# Patient Record
Sex: Male | Born: 1950 | Race: White | Hispanic: No | Marital: Married | Smoking: Never smoker
Health system: Southern US, Community
[De-identification: ages and names within clinical notes are randomized; demographics above are authoritative.]

## PROBLEM LIST (undated history)

## (undated) ENCOUNTER — Emergency Department (HOSPITAL_COMMUNITY): Admission: EM | Payer: BC Managed Care – PPO

## (undated) DIAGNOSIS — L989 Disorder of the skin and subcutaneous tissue, unspecified: Secondary | ICD-10-CM

## (undated) DIAGNOSIS — L509 Urticaria, unspecified: Secondary | ICD-10-CM

## (undated) DIAGNOSIS — I10 Essential (primary) hypertension: Secondary | ICD-10-CM

## (undated) DIAGNOSIS — E785 Hyperlipidemia, unspecified: Secondary | ICD-10-CM

## (undated) DIAGNOSIS — M21379 Foot drop, unspecified foot: Secondary | ICD-10-CM

## (undated) DIAGNOSIS — H2513 Age-related nuclear cataract, bilateral: Secondary | ICD-10-CM

## (undated) DIAGNOSIS — Z7982 Long term (current) use of aspirin: Secondary | ICD-10-CM

## (undated) DIAGNOSIS — H04123 Dry eye syndrome of bilateral lacrimal glands: Secondary | ICD-10-CM

## (undated) DIAGNOSIS — R251 Tremor, unspecified: Secondary | ICD-10-CM

## (undated) DIAGNOSIS — M48061 Spinal stenosis, lumbar region without neurogenic claudication: Secondary | ICD-10-CM

## (undated) DIAGNOSIS — N138 Other obstructive and reflux uropathy: Secondary | ICD-10-CM

## (undated) DIAGNOSIS — Z91018 Allergy to other foods: Secondary | ICD-10-CM

## (undated) DIAGNOSIS — H903 Sensorineural hearing loss, bilateral: Secondary | ICD-10-CM

## (undated) DIAGNOSIS — I2510 Atherosclerotic heart disease of native coronary artery without angina pectoris: Secondary | ICD-10-CM

## (undated) DIAGNOSIS — I219 Acute myocardial infarction, unspecified: Secondary | ICD-10-CM

## (undated) DIAGNOSIS — J45909 Unspecified asthma, uncomplicated: Secondary | ICD-10-CM

## (undated) DIAGNOSIS — R413 Other amnesia: Secondary | ICD-10-CM

## (undated) DIAGNOSIS — M419 Scoliosis, unspecified: Secondary | ICD-10-CM

## (undated) DIAGNOSIS — I700 Atherosclerosis of aorta: Secondary | ICD-10-CM

## (undated) DIAGNOSIS — I255 Ischemic cardiomyopathy: Secondary | ICD-10-CM

## (undated) DIAGNOSIS — R972 Elevated prostate specific antigen [PSA]: Secondary | ICD-10-CM

## (undated) DIAGNOSIS — I6789 Other cerebrovascular disease: Secondary | ICD-10-CM

## (undated) HISTORY — DX: Urticaria, unspecified: L50.9

## (undated) HISTORY — DX: Spinal stenosis, lumbar region without neurogenic claudication: M48.061

## (undated) HISTORY — DX: Foot drop, unspecified foot: M21.379

## (undated) HISTORY — DX: Age-related nuclear cataract, bilateral: H25.13

## (undated) HISTORY — DX: Essential (primary) hypertension: I10

## (undated) HISTORY — PX: TONSILLECTOMY: SUR1361

## (undated) HISTORY — DX: Dry eye syndrome of bilateral lacrimal glands: H04.123

## (undated) HISTORY — DX: Allergy to other foods: Z91.018

## (undated) HISTORY — DX: Scoliosis, unspecified: M41.9

## (undated) HISTORY — DX: Sensorineural hearing loss, bilateral: H90.3

## (undated) HISTORY — DX: Elevated prostate specific antigen (PSA): R97.20

## (undated) HISTORY — DX: Disorder of the skin and subcutaneous tissue, unspecified: L98.9

## (undated) HISTORY — DX: Tremor, unspecified: R25.1

---

## 2002-07-06 DIAGNOSIS — Z800 Family history of malignant neoplasm of digestive organs: Secondary | ICD-10-CM

## 2002-07-06 DIAGNOSIS — E785 Hyperlipidemia, unspecified: Secondary | ICD-10-CM | POA: Diagnosis present

## 2002-07-06 DIAGNOSIS — M545 Low back pain, unspecified: Secondary | ICD-10-CM

## 2002-07-06 DIAGNOSIS — J45909 Unspecified asthma, uncomplicated: Secondary | ICD-10-CM | POA: Diagnosis present

## 2002-07-06 DIAGNOSIS — M1990 Unspecified osteoarthritis, unspecified site: Secondary | ICD-10-CM | POA: Insufficient documentation

## 2002-07-06 HISTORY — DX: Family history of malignant neoplasm of digestive organs: Z80.0

## 2002-07-06 HISTORY — DX: Hyperlipidemia, unspecified: E78.5

## 2002-07-06 HISTORY — DX: Low back pain, unspecified: M54.50

## 2002-07-06 HISTORY — DX: Unspecified osteoarthritis, unspecified site: M19.90

## 2002-07-12 DIAGNOSIS — Z79899 Other long term (current) drug therapy: Secondary | ICD-10-CM | POA: Insufficient documentation

## 2003-09-13 ENCOUNTER — Encounter: Admission: RE | Admit: 2003-09-13 | Discharge: 2003-09-13 | Payer: Self-pay | Admitting: Orthopedic Surgery

## 2003-10-25 ENCOUNTER — Ambulatory Visit (HOSPITAL_COMMUNITY): Admission: RE | Admit: 2003-10-25 | Discharge: 2003-10-25 | Payer: Self-pay | Admitting: Orthopedic Surgery

## 2003-10-25 ENCOUNTER — Ambulatory Visit (HOSPITAL_BASED_OUTPATIENT_CLINIC_OR_DEPARTMENT_OTHER): Admission: RE | Admit: 2003-10-25 | Discharge: 2003-10-25 | Payer: Self-pay | Admitting: Orthopedic Surgery

## 2004-03-30 DIAGNOSIS — Z0000 Encounter for general adult medical examination without abnormal findings: Secondary | ICD-10-CM | POA: Insufficient documentation

## 2012-09-02 DIAGNOSIS — D229 Melanocytic nevi, unspecified: Secondary | ICD-10-CM

## 2012-09-02 HISTORY — DX: Melanocytic nevi, unspecified: D22.9

## 2013-04-06 ENCOUNTER — Ambulatory Visit (INDEPENDENT_AMBULATORY_CARE_PROVIDER_SITE_OTHER): Payer: BC Managed Care – PPO | Admitting: Urology

## 2013-04-06 DIAGNOSIS — R972 Elevated prostate specific antigen [PSA]: Secondary | ICD-10-CM

## 2013-07-06 ENCOUNTER — Ambulatory Visit (INDEPENDENT_AMBULATORY_CARE_PROVIDER_SITE_OTHER): Payer: BC Managed Care – PPO | Admitting: Urology

## 2013-07-06 ENCOUNTER — Encounter (INDEPENDENT_AMBULATORY_CARE_PROVIDER_SITE_OTHER): Payer: Self-pay

## 2013-07-06 DIAGNOSIS — N529 Male erectile dysfunction, unspecified: Secondary | ICD-10-CM

## 2013-07-06 DIAGNOSIS — N400 Enlarged prostate without lower urinary tract symptoms: Secondary | ICD-10-CM

## 2014-02-25 ENCOUNTER — Inpatient Hospital Stay (HOSPITAL_COMMUNITY)
Admission: EM | Admit: 2014-02-25 | Discharge: 2014-02-27 | DRG: 247 | Disposition: A | Discharge status: Home / Self Care | Payer: BC Managed Care – PPO | Source: Other Acute Inpatient Hospital | Attending: Interventional Cardiology | Admitting: Interventional Cardiology

## 2014-02-25 ENCOUNTER — Other Ambulatory Visit: Payer: Self-pay

## 2014-02-25 ENCOUNTER — Encounter (HOSPITAL_COMMUNITY)
Admission: EM | Disposition: A | Discharge status: Home / Self Care | Payer: BC Managed Care – PPO | Source: Other Acute Inpatient Hospital | Attending: Interventional Cardiology

## 2014-02-25 ENCOUNTER — Encounter (HOSPITAL_COMMUNITY): Payer: Self-pay | Admitting: Physician Assistant

## 2014-02-25 DIAGNOSIS — I2589 Other forms of chronic ischemic heart disease: Secondary | ICD-10-CM | POA: Diagnosis present

## 2014-02-25 DIAGNOSIS — I2119 ST elevation (STEMI) myocardial infarction involving other coronary artery of inferior wall: Secondary | ICD-10-CM | POA: Diagnosis present

## 2014-02-25 DIAGNOSIS — I519 Heart disease, unspecified: Secondary | ICD-10-CM

## 2014-02-25 DIAGNOSIS — J45909 Unspecified asthma, uncomplicated: Secondary | ICD-10-CM | POA: Diagnosis present

## 2014-02-25 DIAGNOSIS — I2582 Chronic total occlusion of coronary artery: Secondary | ICD-10-CM | POA: Diagnosis present

## 2014-02-25 DIAGNOSIS — I10 Essential (primary) hypertension: Secondary | ICD-10-CM | POA: Diagnosis present

## 2014-02-25 DIAGNOSIS — E785 Hyperlipidemia, unspecified: Secondary | ICD-10-CM | POA: Diagnosis present

## 2014-02-25 DIAGNOSIS — I2510 Atherosclerotic heart disease of native coronary artery without angina pectoris: Secondary | ICD-10-CM | POA: Diagnosis present

## 2014-02-25 DIAGNOSIS — R079 Chest pain, unspecified: Secondary | ICD-10-CM | POA: Diagnosis present

## 2014-02-25 DIAGNOSIS — Z8249 Family history of ischemic heart disease and other diseases of the circulatory system: Secondary | ICD-10-CM | POA: Diagnosis not present

## 2014-02-25 DIAGNOSIS — I2111 ST elevation (STEMI) myocardial infarction involving right coronary artery: Secondary | ICD-10-CM

## 2014-02-25 DIAGNOSIS — I255 Ischemic cardiomyopathy: Secondary | ICD-10-CM

## 2014-02-25 HISTORY — PX: LEFT HEART CATHETERIZATION WITH CORONARY ANGIOGRAM: SHX5451

## 2014-02-25 HISTORY — DX: Essential (primary) hypertension: I10

## 2014-02-25 HISTORY — PX: CORONARY STENT PLACEMENT: SHX1402

## 2014-02-25 HISTORY — DX: ST elevation (STEMI) myocardial infarction involving other coronary artery of inferior wall: I21.19

## 2014-02-25 HISTORY — DX: Ischemic cardiomyopathy: I25.5

## 2014-02-25 HISTORY — DX: Unspecified asthma, uncomplicated: J45.909

## 2014-02-25 HISTORY — PX: PERCUTANEOUS CORONARY STENT INTERVENTION (PCI-S): SHX5485

## 2014-02-25 HISTORY — DX: Hyperlipidemia, unspecified: E78.5

## 2014-02-25 HISTORY — DX: Atherosclerotic heart disease of native coronary artery without angina pectoris: I25.10

## 2014-02-25 LAB — BASIC METABOLIC PANEL
BUN: 15 mg/dL (ref 6–23)
CO2: 18 mEq/L — ABNORMAL LOW (ref 19–32)
Calcium: 8.6 mg/dL (ref 8.4–10.5)
Chloride: 105 mEq/L (ref 96–112)
Creatinine, Ser: 0.99 mg/dL (ref 0.50–1.35)
GFR calc Af Amer: >90 mL/min (ref >90)
GFR calc non Af Amer: 85 mL/min — ABNORMAL LOW (ref >90)
Glucose, Bld: 95 mg/dL (ref 70–99)
Potassium: 4.5 mEq/L (ref 3.7–5.3)
Sodium: 138 mEq/L (ref 137–147)

## 2014-02-25 LAB — MRSA PCR SCREENING: MRSA by PCR: NEGATIVE

## 2014-02-25 LAB — HEMOGLOBIN A1C
Hgb A1c MFr Bld: 5.5 % (ref <5.7)
Mean Plasma Glucose: 111 mg/dL (ref <117)

## 2014-02-25 LAB — TSH: TSH: 3.22 u[IU]/mL (ref 0.350–4.500)

## 2014-02-25 LAB — TROPONIN I
Troponin I: 7.82 ng/mL (ref <0.30)
Troponin I: >20 ng/mL (ref <0.30)

## 2014-02-25 SURGERY — LEFT HEART CATHETERIZATION WITH CORONARY ANGIOGRAM
Anesthesia: LOCAL

## 2014-02-25 MED ORDER — SODIUM CHLORIDE 0.9 % IV SOLN
1.7500 mg/kg/h | INTRAVENOUS | Status: AC
  Administered 2014-02-25: 1.75 mg/kg/h via INTRAVENOUS
  Filled 2014-02-25: qty 250

## 2014-02-25 MED ORDER — TICAGRELOR 90 MG PO TABS
90.0000 mg | ORAL_TABLET | Freq: Two times a day (BID) | ORAL | Status: DC
  Administered 2014-02-25 – 2014-02-27 (×4): 90 mg via ORAL
  Filled 2014-02-25 (×6): qty 1

## 2014-02-25 MED ORDER — LIDOCAINE HCL (PF) 1 % IJ SOLN
INTRAMUSCULAR | Status: AC
  Filled 2014-02-25: qty 30

## 2014-02-25 MED ORDER — SODIUM CHLORIDE 0.9 % IV SOLN
0.2500 mg/kg/h | INTRAVENOUS | Status: AC
  Administered 2014-02-25: 0.25 mg/kg/h via INTRAVENOUS
  Filled 2014-02-25: qty 250

## 2014-02-25 MED ORDER — ACETAMINOPHEN 325 MG PO TABS
650.0000 mg | ORAL_TABLET | ORAL | Status: DC | PRN
  Administered 2014-02-25: 650 mg via ORAL
  Filled 2014-02-25: qty 2

## 2014-02-25 MED ORDER — TICAGRELOR 90 MG PO TABS
ORAL_TABLET | ORAL | Status: AC
  Filled 2014-02-25: qty 1

## 2014-02-25 MED ORDER — ASPIRIN EC 81 MG PO TBEC
81.0000 mg | DELAYED_RELEASE_TABLET | Freq: Every day | ORAL | Status: DC
  Administered 2014-02-26 – 2014-02-27 (×2): 81 mg via ORAL
  Filled 2014-02-25 (×2): qty 1

## 2014-02-25 MED ORDER — ONDANSETRON HCL 4 MG/2ML IJ SOLN: 4.0000 mg | Freq: Four times a day (QID) | INTRAMUSCULAR | Status: DC | PRN

## 2014-02-25 MED ORDER — FENTANYL CITRATE 0.05 MG/ML IJ SOLN
INTRAMUSCULAR | Status: AC
  Filled 2014-02-25: qty 2

## 2014-02-25 MED ORDER — METOPROLOL TARTRATE 25 MG PO TABS
25.0000 mg | ORAL_TABLET | Freq: Two times a day (BID) | ORAL | Status: DC
  Administered 2014-02-25 – 2014-02-27 (×5): 25 mg via ORAL
  Filled 2014-02-25 (×6): qty 1

## 2014-02-25 MED ORDER — SODIUM CHLORIDE 0.9 % IV SOLN
INTRAVENOUS | Status: DC
  Administered 2014-02-25: 14:00:00 via INTRAVENOUS
  Administered 2014-02-25: 1000 mL via INTRAVENOUS

## 2014-02-25 MED ORDER — NITROGLYCERIN 0.2 MG/ML ON CALL CATH LAB
INTRAVENOUS | Status: AC
  Filled 2014-02-25: qty 1

## 2014-02-25 MED ORDER — TICAGRELOR 90 MG PO TABS
ORAL_TABLET | ORAL | Status: AC
  Administered 2014-02-26: 90 mg via ORAL
  Filled 2014-02-25: qty 1

## 2014-02-25 MED ORDER — NITROGLYCERIN 0.4 MG SL SUBL: 0.4000 mg | SUBLINGUAL_TABLET | SUBLINGUAL | Status: DC | PRN

## 2014-02-25 MED ORDER — MIDAZOLAM HCL 2 MG/2ML IJ SOLN
INTRAMUSCULAR | Status: AC
  Filled 2014-02-25: qty 2

## 2014-02-25 MED ORDER — NITROGLYCERIN IN D5W 200-5 MCG/ML-% IV SOLN
2.0000 ug/min | INTRAVENOUS | Status: DC
  Administered 2014-02-25: 15 ug/min via INTRAVENOUS

## 2014-02-25 MED ORDER — HEPARIN (PORCINE) IN NACL 2-0.9 UNIT/ML-% IJ SOLN
INTRAMUSCULAR | Status: AC
  Filled 2014-02-25: qty 1000

## 2014-02-25 MED ORDER — OXYCODONE-ACETAMINOPHEN 5-325 MG PO TABS
1.0000 | ORAL_TABLET | ORAL | Status: DC | PRN
  Administered 2014-02-25 – 2014-02-26 (×2): 1 via ORAL
  Filled 2014-02-25 (×2): qty 2
  Filled 2014-02-25 (×2): qty 1

## 2014-02-25 MED ORDER — ACETAMINOPHEN 325 MG PO TABS: 650.0000 mg | ORAL_TABLET | ORAL | Status: DC | PRN

## 2014-02-25 MED ORDER — SIMVASTATIN 40 MG PO TABS
40.0000 mg | ORAL_TABLET | Freq: Every day | ORAL | Status: DC
Start: 2014-02-25 — End: 2014-02-26
  Administered 2014-02-25: 40 mg via ORAL
  Filled 2014-02-25 (×2): qty 1

## 2014-02-25 MED ORDER — ASPIRIN 81 MG PO CHEW: 81.0000 mg | CHEWABLE_TABLET | Freq: Every day | ORAL | Status: DC

## 2014-02-25 MED ORDER — VERAPAMIL HCL 2.5 MG/ML IV SOLN
INTRAVENOUS | Status: AC
  Filled 2014-02-25: qty 2

## 2014-02-25 MED ORDER — BIVALIRUDIN 250 MG IV SOLR
INTRAVENOUS | Status: AC
  Filled 2014-02-25: qty 250

## 2014-02-25 MED ORDER — HEPARIN SODIUM (PORCINE) 1000 UNIT/ML IJ SOLN
INTRAMUSCULAR | Status: AC
  Filled 2014-02-25: qty 1

## 2014-02-25 NOTE — CV Procedure (Addendum)
Left Heart Catheterization with Coronary Angiography and Emergency PCI Report  Eric Cortez  63 y.o.  male June 01, 1951  Procedure Date: 02/25/2014 Referring Physician: HWB Blenda Bridegroom, M.D. Primary Cardiologist: HWB Blenda Bridegroom, M.D.  INDICATIONS: Acute inferior ST elevation myocardial infarction  PROCEDURE: 1. Left heart catheterization; 2. Left ventriculography; 3. Coronary angiography; 4. Drug-eluting stent implantation right coronary. 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 Risks including death, stroke, heart attack, kidney injury, allergy, limb ischemia, bleeding and radiation injury were discussed.  The patient verbalized understanding and wanted to proceed.  Informed written consent was obtained.  PROCEDURE TECHNIQUE:  After Xylocaine anesthesia a 5 French cath slender sheath was placed in the right radial artery with an angiocath and the modified Seldinger technique.  Coronary angiography was done using a 5 F JL 3.5 cm, JR 4 guide catheter, AL-1 guide catheter, and AL- .75 guide catheter.  Left ventriculography was done using the JR 4 catheter and hand injection.   The digital images were reviewed. 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 We went through multiple catheters as listed above to find one that would seat in the ostium. We could not obtain coaxial seating. We decided to perform PCI as the proximal to mid right coronary was totally occluded.  After intravenous bivalirudin bolus and infusion with a therapeutic ACT documented , we were able to seat a left Amplatz 0.75 guide catheter near the ostium of the RCA. While  holding the catheter in place we will able to slide a Pro-water wire into the proximal vessel and with some difficulty beyond the total occlusion in the proximal to mid vessel into the distal RCA. An additional 190 cm guidewire, BMW, was then advanced into the right coronary and distally into the PDA to provide additional support of the guide catheter and in  the ostium of the RCA. A 3.0 X 15 MM TREK balloon was used to open the RCA. We then positioned and deployed a 23 x 3.5 Xience Alpine drug-eluting stent  at 16 atmospheres x2 inflations. The BMW wire was removed prior to stent deployment. We then advanced a 4.0 x 15 mm Radar Base track into the middle portion of the stent and dilated to 12 atmospheres. A nice angiographic result was obtained. The very distal tip of the RCA near the apex was occluded, likely do to distal embolization.    CONTRAST:  Total of  380  cc.  COMPLICATIONS:   none   HEMODYNAMICS:  Aortic pressure  131/92 mmHg; LV pressure 133/6 mmHg; LVEDP  12 mmHg  ANGIOGRAPHIC DATA:   The left main coronary artery is normal .  The left anterior descending artery is  supplies the anterior wall to the apex. It does not wrap around the apex. It contains irregularities up to 50% in the proximal and mid vessel. It gives origin to 2 diagonal branches are widely patent. No significant obstructions felt to be present in the LAD.Marland Kitchen  The left circumflex artery is a large codominant vessel giving origin to 4 obtuse marginal branches. The first obtuse marginal arises proximal and contains ostial/proximal eccentric 50% narrowing. The second obtuse marginal is  widely patent. The circumflex just distal to this branch contains smooth 30-40% narrowing. Two distal obtuse marginal branches are widely patent with one being quite large the other tiny and insignificant..  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 We  had to use multiple catheters are noted to visualize the RCA. We demonstrated that it was totally occluded in the proximal to mid segment.   PCI RESULTS: The proximal to mid RCA is 100% occluded. After angioplasty and stenting with a final diameter of 4.0 mm, 0% stenosis was noted. A drug-eluting stent was deployed. The distal/apical portion of the PDA is totally occluded secondary to embolization from the angioplasty site. This is being treated with prolonged antithrombotic therapy and antiplatelet therapy. The patient was asymptomatic post procedure.   LEFT VENTRICULOGRAM:  Left ventricular angiogram was done in the 30 RAO projection and revealed  inferoapical moderate to severe hypokinesis with preserved overall function and an estimated ejection fraction of 60%.    IMPRESSIONS: 1. Inferior ST elevation MI secondary to acute RCA occlusion.  2. 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 Successful drug-eluting stent implantation in the proximal to mid segment of the RCA using DES and reduction of total occlusion to 0% with TIMI grade 3 flow. The very distal quarter of the PDA was totally occluded at completion of the case related to embolization from the angioplasty site.  4. Widely patent left coronary system with 50% stenosis in the first marginal and irregularities in the proximal and mid LAD.  5. Overall normal LV function with a small region of inferoapical severe hypokinesis/akinesis.   RECOMMENDATION:    Aspirin and Brilinta for 12 months Beta blocker and statin therapy Candidate for discharge at 48 hours post infarct assuming no complications.  Marland Kitchen

## 2014-02-25 NOTE — Care Management Note (Addendum)
    Page 1 of 1   02/26/2014     11:06:00 AM CARE MANAGEMENT NOTE 02/26/2014  Patient:  Eric Cortez, Eric Cortez   Account Number:  [id number]  Date Initiated:  02/25/2014  Documentation initiated by:  Elissa Hefty  Subjective/Objective Assessment:   adm i mi     Action/Plan:   lives w wife, pcp dr Remo Lipps burdine   Anticipated DC Date:     Anticipated DC Plan:        [place]  CM consult  Medication Assistance      Choice offered to / List presented to:             Status of service:   Medicare Important Message given?   (If response is "NO", the following Medicare IM given date fields will be blank) Date Medicare IM given:   Date Additional Medicare IM given:    Discharge Disposition:    Per UR Regulation:  Reviewed for med. necessity/level of care/duration of stay  If discussed at Long Length of Stay Meetings, dates discussed:    Comments:    02-26-14 [place], RN,BSN [phone] BRILINTA 90 MG BID COVER -YES CO-PAY- $ 64.00 FOR 30 DAYS PRIOR APPROVAL - NO PHARMACY : [facility] , [facility], [facility] ,[facility] ** MAIL ORDER : 90 DAY SUPPLY -- $ 160.00 **     6/26 1423 debbie dowell rn,bsn gave pt 30day free brilinta card and copay assist card.

## 2014-02-25 NOTE — H&P (Signed)
    Eric Cortez is an 63 y.o. male.   Chief Complaint: Chest Pain/STEMI HPI:   The patient is a 63 yo caucasian male with a history of HLD, asthma.  He has never been a smoker.  He is supposed to take simvastatin but does not.  He developed CP approximately 2hrs ago.  He describes it as pressure, 7/10 at the worse and radiated up to his neck.  Some diaphoresis.  He denies nausea, vomiting, fever, shortness of breath, orthopnea, dizziness, PND, cough, congestion, abdominal pain, hematochezia, melena, lower extremity edema.   Past Medical History  Diagnosis Date  . Asthma   . HLD (hyperlipidemia)     No past surgical history on file.  Family History  Problem Relation Age of Onset  . Heart attack Father    Social History:  reports that he has never smoked. He does not have any smokeless tobacco history on file. He reports that he drinks alcohol. His drug history is not on file.  Allergies: Allergies not on file  No prescriptions prior to admission    No results found for this or any previous visit (from the past 48 hour(s)). No results found.  Review of Systems  Constitutional: Positive for diaphoresis. Negative for fever and chills.  HENT: Negative for congestion and sore throat.   Respiratory: Negative for cough and shortness of breath.   Cardiovascular: Positive for chest pain. Negative for orthopnea, leg swelling and PND.  Gastrointestinal: Negative for nausea, vomiting, abdominal pain, blood in stool and melena.  Genitourinary: Negative for hematuria.  Musculoskeletal: Negative for myalgias.  Neurological: Negative for dizziness.  All other systems reviewed and are negative.   There were no vitals taken for this visit. Physical Exam  Nursing note and vitals reviewed. Constitutional: He is oriented to person, place, and time. He appears well-developed and well-nourished. No distress.  HENT:  Head: Normocephalic and atraumatic.  Eyes: EOM are normal. Pupils are  equal, round, and reactive to [place]. No scleral icterus.  Neck: Normal range of motion. Neck supple.  Cardiovascular: Normal rate, regular rhythm, S1 normal and S2 normal.   Murmur heard.  Systolic murmur is present with a grade of 1/6  Pulses:      Radial pulses are 2+ on the right side, and 2+ on the left side.       Dorsalis pedis pulses are 2+ on the right side, and 2+ on the left side.  Respiratory: Effort normal and breath sounds normal. He has no wheezes. He has no rales.  GI: Soft. Bowel sounds are normal. He exhibits no distension. There is no tenderness.  Musculoskeletal: He exhibits no edema.  Neurological: He is alert and oriented to person, place, and time. He exhibits normal muscle tone.  Skin: Skin is warm.  Psychiatric: He has a normal mood and affect.     Assessment/Plan Principal Problem:   STEMI (ST elevation myocardial infarction) Active Problems:   HLD (hyperlipidemia)   Asthma   HTN (hypertension)  Plan:  The patient was taken emergently to the cath lab.  Start statin.  Beta blocker as bp will allow.  Echocardiogram.   Tarri Fuller, PAC 02/25/2014, 11:56 AM

## 2014-02-25 NOTE — Progress Notes (Signed)
  Echocardiogram 2D Echocardiogram has been performed.  Eric Cortez 02/25/2014, 3:57 PM

## 2014-02-25 NOTE — H&P (Signed)
Acute onset of chest discomfort earlier this morning with EKG documentation of severe inferolateral ST segment elevation. No prior history of coronary disease. Relatively normal health otherwise. He is an acute inferior ST elevation myocardial infarction and brought straight to the cath lab for percutaneous intervention to reestablish blood flow. No contraindications for dual antiplatelet therapy.

## 2014-02-26 DIAGNOSIS — I2119 ST elevation (STEMI) myocardial infarction involving other coronary artery of inferior wall: Secondary | ICD-10-CM

## 2014-02-26 LAB — BASIC METABOLIC PANEL
BUN: 13 mg/dL (ref 6–23)
CO2: 18 mEq/L — ABNORMAL LOW (ref 19–32)
Calcium: 8.4 mg/dL (ref 8.4–10.5)
Chloride: 106 mEq/L (ref 96–112)
Creatinine, Ser: 1.15 mg/dL (ref 0.50–1.35)
GFR calc Af Amer: 76 mL/min — ABNORMAL LOW (ref >90)
GFR calc non Af Amer: 66 mL/min — ABNORMAL LOW (ref >90)
Glucose, Bld: 105 mg/dL — ABNORMAL HIGH (ref 70–99)
Potassium: 4.2 mEq/L (ref 3.7–5.3)
Sodium: 138 mEq/L (ref 137–147)

## 2014-02-26 LAB — CBC
HCT: 44.7 % (ref 39.0–52.0)
Hemoglobin: 15.1 g/dL (ref 13.0–17.0)
MCH: 30.8 pg (ref 26.0–34.0)
MCHC: 33.8 g/dL (ref 30.0–36.0)
MCV: 91.2 fL (ref 78.0–100.0)
Platelets: 171 10*3/uL (ref 150–400)
RBC: 4.9 MIL/uL (ref 4.22–5.81)
RDW: 13.1 % (ref 11.5–15.5)
WBC: 8 10*3/uL (ref 4.0–10.5)

## 2014-02-26 LAB — LIPID PANEL
Cholesterol: 166 mg/dL (ref 0–200)
HDL: 31 mg/dL — ABNORMAL LOW (ref >39)
LDL Cholesterol: 118 mg/dL — ABNORMAL HIGH (ref 0–99)
Total CHOL/HDL Ratio: 5.4 RATIO
Triglycerides: 83 mg/dL (ref <150)
VLDL: 17 mg/dL (ref 0–40)

## 2014-02-26 LAB — TROPONIN I
Troponin I: 17.9 ng/mL (ref <0.30)
Troponin I: 15.15 ng/mL (ref <0.30)

## 2014-02-26 MED ORDER — ATORVASTATIN CALCIUM 80 MG PO TABS
80.0000 mg | ORAL_TABLET | Freq: Every day | ORAL | Status: DC
  Administered 2014-02-26: 80 mg via ORAL
  Filled 2014-02-26 (×2): qty 1

## 2014-02-26 NOTE — Progress Notes (Signed)
   Subjective:   63 y/o male with HL and asthma admitted 6/26 with inferior STEMI. Pk trop > 20  Underwent PCI/DES of anomalous RCA. Nonobstructive CAD in LAD and LCX. EF 60%  Echo EF 45-50% inf HK. RV ok.   Feels great. No Cp. Ambulating     Intake/Output Summary (Last 24 hours) at 02/26/14 1202 Last data filed at 02/26/14 1100  Gross per 24 hour  Intake 2936.9 ml  Output   1450 ml  Net 1486.9 ml    Current meds: . aspirin EC  81 mg Oral Daily  . metoprolol tartrate  25 mg Oral BID  . simvastatin  40 mg Oral q1800  . ticagrelor  90 mg Oral BID   Infusions: . nitroGLYCERIN Stopped (02/25/14 2100)     Objective:  Blood pressure 101/67, pulse 58, temperature 97.8 F (36.6 C), temperature source Oral, resp. rate 17, height 5\' 8"  (1.727 m), weight 78.2 kg (172 lb 6.4 oz), SpO2 97.00%. Weight change:   Physical Exam: General:  Well appearing. No resp difficulty HEENT: normal Neck: supple. JVP flat . Carotids 2+ bilat; no bruits. No lymphadenopathy or thryomegaly appreciated. Cor: PMI nondisplaced. Regular rate & rhythm. No rubs, gallops or murmurs. Lungs: clear Abdomen: soft, nontender, nondistended. No hepatosplenomegaly. No bruits or masses. Good bowel sounds. Extremities: no cyanosis, clubbing, rash, edema Neuro: alert & orientedx3, cranial nerves grossly intact. moves all 4 extremities w/o difficulty. Affect pleasant  Telemetry: SR  Lab Results: Basic Metabolic Panel:  Recent Labs Lab 02/25/14 1602 02/26/14 0137  NA 138 138  K 4.5 4.2  CL 105 106  CO2 18* 18*  GLUCOSE 95 105*  BUN 15 13  CREATININE 0.99 1.15  CALCIUM 8.6 8.4   Liver Function Tests: No results found for this basename: AST, ALT, ALKPHOS, BILITOT, PROT, ALBUMIN,  in the last 168 hours No results found for this basename: LIPASE, AMYLASE,  in the last 168 hours No results found for this basename: AMMONIA,  in the last 168 hours CBC:  Recent Labs Lab 02/26/14 0137  WBC 8.0  HGB  15.1  HCT 44.7  MCV 91.2  PLT 171   Cardiac Enzymes:  Recent Labs Lab 02/25/14 0602 02/25/14 2007 02/26/14 0137 02/26/14 0725  TROPONINI 7.82* >20.00* 17.90* 15.15*   BNP: No components found with this basename: POCBNP,  CBG: No results found for this basename: GLUCAP,  in the last 168 hours Microbiology: No results found for this basename: cult   No results found for this basename: CULT, SDES,  in the last 168 hours  Imaging: No results found.   ASSESSMENT:  1. Inferior STEMI/CAD    --s/p DES to anomalous RCA on 6/26 2. HL  PLAN/DISCUSSION: Doing well post MI. Continue post MI care with asa, brilinta, statin,b-blocker and cardiac rehab. Can go to tele. Home in am hopefully.    LOS: 1 day    Glori Bickers, MD 02/26/2014, 12:02 PM

## 2014-02-26 NOTE — Progress Notes (Signed)
CARDIAC REHAB PHASE I   PRE:  Rate/Rhythm: 63 sinus  BP:  Supine: 106/68 Sitting:   Standing:    SaO2: 97 2L 98 RA  MODE:  Ambulation: 250 ft   POST:  Rate/Rhythem: 73 sinus  BP:  Supine:   Sitting: 111/65  Standing:    SaO2: 97 RA  Pt ambulated 250 ft with assist x1.  Pt tolerated walk well without complaint.    Pt walked on room air and maintained SaO2 at 97%, O2 left off.  Completed d/c education with pt and wife.  Discussed MI book, stent and Birlinta, CP/NTG use/calling 911, restrictions, diet, and exercise.  Also discussed Cardiac Rehab Phase II with pt request to have info sent to [facility].  Pt and wife voiced understanding.  Pt encouraged to continue walking with staff and family over weekend. Alberteen Sam, MA, ACSM RCEP [phone]  Clotilde Dieter

## 2014-02-27 ENCOUNTER — Encounter (HOSPITAL_COMMUNITY): Payer: Self-pay | Admitting: Nurse Practitioner

## 2014-02-27 DIAGNOSIS — E785 Hyperlipidemia, unspecified: Secondary | ICD-10-CM

## 2014-02-27 DIAGNOSIS — I2119 ST elevation (STEMI) myocardial infarction involving other coronary artery of inferior wall: Secondary | ICD-10-CM

## 2014-02-27 DIAGNOSIS — I255 Ischemic cardiomyopathy: Secondary | ICD-10-CM | POA: Diagnosis present

## 2014-02-27 DIAGNOSIS — I2510 Atherosclerotic heart disease of native coronary artery without angina pectoris: Secondary | ICD-10-CM

## 2014-02-27 DIAGNOSIS — I2589 Other forms of chronic ischemic heart disease: Secondary | ICD-10-CM

## 2014-02-27 DIAGNOSIS — J45909 Unspecified asthma, uncomplicated: Secondary | ICD-10-CM

## 2014-02-27 HISTORY — DX: ST elevation (STEMI) myocardial infarction involving other coronary artery of inferior wall: I21.19

## 2014-02-27 MED ORDER — METOPROLOL TARTRATE 25 MG PO TABS: 25.0000 mg | ORAL_TABLET | Freq: Two times a day (BID) | ORAL | Status: DC

## 2014-02-27 MED ORDER — NITROGLYCERIN 0.4 MG SL SUBL: 0.4000 mg | SUBLINGUAL_TABLET | SUBLINGUAL | Status: DC | PRN

## 2014-02-27 MED ORDER — ATORVASTATIN CALCIUM 80 MG PO TABS: 80.0000 mg | ORAL_TABLET | Freq: Every day | ORAL | Status: DC

## 2014-02-27 MED ORDER — TICAGRELOR 90 MG PO TABS: 90.0000 mg | ORAL_TABLET | Freq: Two times a day (BID) | ORAL | Status: DC

## 2014-02-27 MED ORDER — ASPIRIN 81 MG PO TBEC: 81.0000 mg | DELAYED_RELEASE_TABLET | Freq: Every day | ORAL | Status: DC

## 2014-02-27 NOTE — Progress Notes (Signed)
The patient was seen and examined, and I agree with the assessment and plan as documented above. Pt symptomatically stable after RCA stent. Will do cardiac rehab at [facility] (lives in [place]). Continue current medical therapy. Discharge to home today.

## 2014-02-27 NOTE — Discharge Summary (Signed)
See my note for further details. 

## 2014-02-27 NOTE — Progress Notes (Signed)
Patient Name: Eric Cortez Date of Encounter: 02/27/2014     Principal Problem:   Acute MI, inferior wall, initial episode of care Active Problems:   CAD (coronary artery disease)   Ischemic cardiomyopathy   HLD (hyperlipidemia)   Asthma    SUBJECTIVE  No chest pain or sob overnight.  Ambulating w/o difficulty.  Plans to enroll in cardiac rehab in [place].    CURRENT MEDS . aspirin EC  81 mg Oral Daily  . atorvastatin  80 mg Oral q1800  . metoprolol tartrate  25 mg Oral BID  . ticagrelor  90 mg Oral BID    OBJECTIVE  Filed Vitals:   02/26/14 1300 02/26/14 2029 02/27/14 0505 02/27/14 0700  BP: 96/66 101/70 111/74   Pulse:  60 56   Temp:  98.6 F (37 C) 97.9 F (36.6 C)   TempSrc:  Oral Oral   Resp: 14 18 18    Height:      Weight:    167 lb 12.8 oz (76.114 kg)  SpO2:  94% 94%     Intake/Output Summary (Last 24 hours) at 02/27/14 0746 Last data filed at 02/26/14 1737  Gross per 24 hour  Intake   1040 ml  Output   1000 ml  Net     40 ml   Filed Weights   02/25/14 1353 02/26/14 0500 02/27/14 0700  Weight: 167 lb 15.9 oz (76.2 kg) 172 lb 6.4 oz (78.2 kg) 167 lb 12.8 oz (76.114 kg)    PHYSICAL EXAM  General: Pleasant, NAD. Neuro: Alert and oriented X 3. Moves all extremities spontaneously. Psych: Normal affect. HEENT:  Normal  Neck: Supple without bruits or JVD. Lungs:  Resp regular and unlabored, CTA. Heart: RRR no s3, s4, or murmurs. Abdomen: Soft, non-tender, non-distended, BS + x 4.  Extremities: No clubbing, cyanosis or edema. DP/PT/Radials 2+ and equal bilaterally.  R wrist cath site w/o bleeding/bruit/hematoma.  Accessory Clinical Findings  CBC  Recent Labs  02/26/14 0137  WBC 8.0  HGB 15.1  HCT 44.7  MCV 91.2  PLT 532   Basic Metabolic Panel  Recent Labs  02/25/14 1602 02/26/14 0137  NA 138 138  K 4.5 4.2  CL 105 106  CO2 18* 18*  GLUCOSE 95 105*  BUN 15 13  CREATININE 0.99 1.15  CALCIUM 8.6 8.4   Cardiac  Enzymes  Recent Labs  02/25/14 2007 02/26/14 0137 02/26/14 0725  TROPONINI >20.00* 17.90* 15.15*   Hemoglobin A1C  Recent Labs  02/25/14 1602  HGBA1C 5.5   Fasting Lipid Panel  Recent Labs  02/26/14 0136  CHOL 166  HDL 31*  LDLCALC 118*  TRIG 83  CHOLHDL 5.4   Thyroid Function Tests  Recent Labs  02/25/14 1602  TSH 3.220    TELE  RSR/SB (50's)  ECG  Sb, 52, inf infarct, twi II, III, aVF, V3, V4.  Radiology/Studies  No results found.  ASSESSMENT AND PLAN  1.  Acute Inferior STEMI/CAD:  S/p PCI/DES to the anomalous RCA on 6/26.  No recurrent chest pain. Ambulating w/o difficulty.  Ready for d/c this AM.  Cont asa, statin, bb, brilinta.  F/U in 7 days - wished to f/u in [place] - Dr. Tamala Julian.  Will do cardiac rehab @ [facility].  2.  ICM:  EF 45-50% with inf/infseptal WMA on echo.  Euvolemic on exam.  Cont low-dose BB (baseline bradycardia).  No ACEI/ARB 2/2 soft blood pressures.  3.  HL:  LDL 118.  Cont high  potency statin.  F/U lipids/lft's in 8 wks.  Signed, Murray Hodgkins NP

## 2014-02-27 NOTE — Discharge Instructions (Signed)
**  PLEASE REMEMBER TO BRING ALL OF YOUR MEDICATIONS TO EACH OF YOUR FOLLOW-UP OFFICE VISITS. ° °NO HEAVY LIFTING X 4 WEEKS. °NO SEXUAL ACTIVITY X 4 WEEKS. °NO DRIVING X 2 WEEKS. °NO SOAKING BATHS, HOT TUBS, POOLS, ETC., X 7 DAYS. ° °Radial Site Care °Refer to this sheet in the next few weeks. These instructions provide you with information on caring for yourself after your procedure. Your caregiver may also give you more specific instructions. Your treatment has been planned according to current medical practices, but problems sometimes occur. Call your caregiver if you have any problems or questions after your procedure. °HOME CARE INSTRUCTIONS °· You may shower the day after the procedure. Remove the bandage (dressing) and gently wash the site with plain soap and water. Gently pat the site dry.  °· Do not apply powder or lotion to the site.  °· Do not submerge the affected site in water for 3 to 5 days.  °· Inspect the site at least twice daily.  °· Do not flex or bend the affected arm for 24 hours.  °· No lifting over 5 pounds (2.3 kg) for 5 days after your procedure.  °· Do not drive home if you are discharged the same day of the procedure. Have someone else drive you.  ° °What to expect: °· Any bruising will usually fade within 1 to 2 weeks.  °· Blood that collects in the tissue (hematoma) may be painful to the touch. It should usually decrease in size and tenderness within 1 to 2 weeks.  °SEEK IMMEDIATE MEDICAL CARE IF: °· You have unusual pain at the radial site.  °· You have redness, warmth, swelling, or pain at the radial site.  °· You have drainage (other than a small amount of blood on the dressing).  °· You have chills.  °· You have a fever or persistent symptoms for more than 72 hours.  °· You have a fever and your symptoms suddenly get worse.  °· Your arm becomes pale, cool, tingly, or numb.  °· You have heavy bleeding from the site. Hold pressure on the site.  ° °

## 2014-02-27 NOTE — Discharge Summary (Signed)
Discharge Summary   Patient ID: Eric Cortez,  MRN: [id number], DOB/AGE: 64-Aug-1952 64 y.o.  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  Active Problems:   CAD (coronary artery disease)   Ischemic cardiomyopathy  **EF 45-50% by echocardiography with severe inferior and inferoseptal hypokinesis.   HLD (hyperlipidemia)  **LDL 118.   Asthma   Allergies No Known Allergies  Procedures  Cardiac Catheterization and Percutaneous Coronary Intervention 6.26.2015  HEMODYNAMICS:  Aortic pressure  131/92 mmHg; LV pressure 133/6 mmHg; LVEDP  12 mmHg  ANGIOGRAPHIC DATA:   The left main coronary artery is normal .  The left anterior descending artery is  supplies the anterior wall to the apex. It does not wrap around the apex. It contains irregularities up to 50% in the proximal and mid vessel. It gives origin to 2 diagonal branches are widely patent. No significant obstructions felt to be present in the LAD.Marland Kitchen  The left circumflex artery is a large codominant vessel giving origin to 4 obtuse marginal branches. The first obtuse marginal arises proximal and contains ostial/proximal eccentric 50% narrowing. The second obtuse marginal is widely patent. The circumflex just distal to this branch contains smooth 30-40% narrowing. Two distal obtuse marginal branches are widely patent with one being quite large the other tiny and insignificant..  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 We had to use multiple catheters are noted to visualize the RCA. We demonstrated that it was totally occluded in the proximal to mid  segment.    **The RCA was successfully stented using a 3.5 x 23 mm Xience Alpine DES.**  LEFT VENTRICULOGRAM:  Left ventricular angiogram was done in the 30 RAO projection and revealed  inferoapical moderate to severe hypokinesis with preserved overall function and an estimated ejection fraction of 60%.  _____________   2D Echocardiogram 6.26.2015  Study Conclusions  - Left ventricle: The cavity size was normal. Wall thickness was   normal. Systolic function was mildly reduced. The estimated   ejection fraction was in the range of 45% to 50%. Inferoseptal   and inferior severe hypokinesis. Doppler parameters are   consistent with abnormal left ventricular relaxation (grade 1   diastolic dysfunction). - Aortic valve: There was no stenosis. - Mitral valve: There was no significant regurgitation. - Right ventricle: The cavity size was normal. Systolic function   was normal. - Pulmonary arteries: No complete TR doppler jet so unable to   estimate PA systolic pressure. - Inferior vena cava: The vessel was normal in size. The   respirophasic diameter changes were in the normal range (= 50%),   consistent with normal central venous pressure. _____________   History of Present Illness  63 year old male without a prior cardiac history. He was in his usual state of health until approximately 2 hours prior to admission on the morning of June 26, when he developed substernal chest pressure radiating to his neck associated with diaphoresis. EMS was called and he was found to have inferolateralST segment elevation. A code STEMI was called and the patient was taken emergently to the [facility] cone cardiac catheterization laboratory.  Hospital Course  Patient underwent emergent diagnostic cardiac catheterization revealing an occluded anomalous right  coronary artery and otherwise nonobstructive coronary artery disease and normal LV function. The right coronary artery was successfully intervened upon  using a 3.5 x 23 mm Xience Alpine drug-eluting stent. Patient tolerated procedure well and post procedure was monitored in the coronary intensive care unit. There, he eventually peaked his troponin at greater than 20. He was placed on aspirin, brilinta, beta blocker, and high potency statin therapy. 2-D echocardiogram was performed on June 26 and showed an EF of 45-50% with severe inferoseptal and inferior hypokinesis. Patient's volume status has been stable throughout admission and he was transferred out to the floor on June 27th. Patient has been seen by cardiac rehabilitation and has been in doing without recurrent symptoms or limitations. He plans to partake in cardiac rehabilitation in [place] in the future. He'll be discharged home today in good condition with arrangements for followup within 7 days in our office.  Discharge Vitals Blood pressure 111/74, pulse 56, temperature 97.9 F (36.6 C), temperature source Oral, resp. rate 18, height 5\' 8"  (1.727 m), weight 167 lb 12.8 oz (76.114 kg), SpO2 94.00%.  Filed Weights   02/25/14 1353 02/26/14 0500 02/27/14 0700  Weight: 167 lb 15.9 oz (76.2 kg) 172 lb 6.4 oz (78.2 kg) 167 lb 12.8 oz (76.114 kg)    Labs  CBC  Recent Labs  02/26/14 0137  WBC 8.0  HGB 15.1  HCT 44.7  MCV 91.2  PLT 409   Basic Metabolic Panel  Recent Labs  02/25/14 1602 02/26/14 0137  NA 138 138  K 4.5 4.2  CL 105 106  CO2 18* 18*  GLUCOSE 95 105*  BUN 15 13  CREATININE 0.99 1.15  CALCIUM 8.6 8.4   Cardiac Enzymes  Recent Labs  02/25/14 2007 02/26/14 0137 02/26/14 0725  TROPONINI >20.00* 17.90* 15.15*   Hemoglobin A1C  Recent Labs  02/25/14 1602  HGBA1C 5.5   Fasting Lipid Panel  Recent Labs  02/26/14 0136  CHOL 166  HDL 31*  LDLCALC 118*  TRIG 83  CHOLHDL 5.4   Thyroid Function Tests  Recent Labs  02/25/14 1602  TSH 3.220    Disposition  Pt is being discharged home today in good condition.  Follow-up Plans &  Appointments      Follow-up Information   Follow up with Sinclair Grooms, MD In 1 week. (We will arrange for f/u with Dr. Thompson Caul NP or PA and contact you.)    Specialty:  Cardiology   Contact information:   [address] [place] [address] [phone]       Follow up with Curlene Labrum, MD. (as scheduled.)    Contact information:   [address] [place] [address] [phone]      Discharge Medications    Medication List         albuterol 108 (90 BASE) MCG/ACT inhaler  Commonly known as:  PROVENTIL HFA;VENTOLIN HFA  Inhale 2 puffs into the lungs every 6 (six) hours as needed for wheezing or shortness of breath.     aspirin 81 MG EC tablet  Take 1 tablet (81 mg total) by mouth daily.     atorvastatin 80 MG tablet  Commonly known as:  LIPITOR  Take 1 tablet (80 mg total) by mouth daily at 6 PM.     budesonide 180 MCG/ACT inhaler  Commonly known as:  PULMICORT  Inhale 1 puff into the lungs 2 (two) times daily as needed (shortness of breath).     fexofenadine  180 MG tablet  Commonly known as:  ALLEGRA  Take 180 mg by mouth daily.     metoprolol tartrate 25 MG tablet  Commonly known as:  LOPRESSOR  Take 1 tablet (25 mg total) by mouth 2 (two) times daily.     nitroGLYCERIN 0.4 MG SL tablet  Commonly known as:  NITROSTAT  Place 1 tablet (0.4 mg total) under the tongue every 5 (five) minutes x 3 doses as needed for chest pain.     ticagrelor 90 MG Tabs tablet  Commonly known as:  BRILINTA  Take 1 tablet (90 mg total) by mouth 2 (two) times daily.        Outstanding Labs/Studies  F/U lipids and LFT's in 8 wks.  Duration of Discharge Encounter   Greater than 30 minutes including physician time.  Signed, Murray Hodgkins NP 02/27/2014, 8:19 AM

## 2014-02-28 MED FILL — Sodium Chloride IV Soln 0.9%: INTRAVENOUS | Qty: 50 | Status: AC

## 2014-02-28 MED FILL — Heparin Sodium (Porcine) 100 Unt/ML in Sodium Chloride 0.45%: INTRAMUSCULAR | Qty: 250 | Status: AC

## 2014-02-28 MED FILL — Nitroglycerin IV Soln 200 MCG/ML in D5W: INTRAVENOUS | Qty: 250 | Status: AC

## 2014-03-01 LAB — POCT I-STAT, CHEM 8
BUN: 17 mg/dL (ref 6–23)
Calcium, Ion: 1.17 mmol/L (ref 1.13–1.30)
Chloride: 102 mEq/L (ref 96–112)
Creatinine, Ser: 1.1 mg/dL (ref 0.50–1.35)
Glucose, Bld: 180 mg/dL — ABNORMAL HIGH (ref 70–99)
HCT: 47 % (ref 39.0–52.0)
Hemoglobin: 16 g/dL (ref 13.0–17.0)
Potassium: 3.6 mEq/L — ABNORMAL LOW (ref 3.7–5.3)
Sodium: 138 mEq/L (ref 137–147)
TCO2: 18 mmol/L (ref 0–100)

## 2014-03-03 LAB — POCT ACTIVATED CLOTTING TIME
Activated Clotting Time: 163 seconds
Activated Clotting Time: 462 seconds

## 2014-03-10 ENCOUNTER — Encounter: Payer: Self-pay | Admitting: Interventional Cardiology

## 2014-03-10 ENCOUNTER — Ambulatory Visit (INDEPENDENT_AMBULATORY_CARE_PROVIDER_SITE_OTHER): Payer: BC Managed Care – PPO | Admitting: Interventional Cardiology

## 2014-03-10 VITALS — BP 132/76 | HR 49 | Ht 68.0 in | Wt 169.0 lb

## 2014-03-10 DIAGNOSIS — I2510 Atherosclerotic heart disease of native coronary artery without angina pectoris: Secondary | ICD-10-CM

## 2014-03-10 DIAGNOSIS — I2119 ST elevation (STEMI) myocardial infarction involving other coronary artery of inferior wall: Secondary | ICD-10-CM

## 2014-03-10 DIAGNOSIS — E785 Hyperlipidemia, unspecified: Secondary | ICD-10-CM

## 2014-03-10 DIAGNOSIS — I255 Ischemic cardiomyopathy: Secondary | ICD-10-CM

## 2014-03-10 DIAGNOSIS — I2589 Other forms of chronic ischemic heart disease: Secondary | ICD-10-CM

## 2014-03-10 MED ORDER — NITROGLYCERIN 0.4 MG SL SUBL: 0.4000 mg | SUBLINGUAL_TABLET | SUBLINGUAL | Status: DC | PRN

## 2014-03-10 NOTE — Patient Instructions (Signed)
Your physician recommends that you continue on your current medications as directed. Please refer to the Current Medication list given to you today.  Your physician recommends that you schedule a follow-up appointment in: 3 months  

## 2014-03-10 NOTE — Progress Notes (Signed)
Patient ID: Eric Cortez, male   DOB: Jan 02, 1951, 63 y.o.   MRN: [id number]    [address], [address], [place]  [address] Phone: [phone] Fax:  [phone]  Date:  03/10/2014   ID:  Eric Cortez, DOB February 10, 1951, MRN [id number]  PCP:  Curlene Labrum, MD   ASSESSMENT:  1. 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 No recurrence of symptoms.  2. Hypertension, controlled 3. Hyperlipidemia   PLAN:  1. Gradually increasing physical activity 2. Phase II cardiac rehabilitation 3. Prescription for nitroglycerin 4. Clinical followup in 3 months 5. Driving, sexual intercourse, progressive physical activity out doors is approved. 6. Avoid physical isolation from other people for at least one month post infarct   SUBJECTIVE: Eric Cortez is a 63 y.o. male developed an acute inferior infarction 2 weeks ago. He has a history of asthma. He has anomalous origin of the right coronary. He had successful DES placed in the proximal right coronary. The procedure was a complicated procedure due to the anomalous origin of the right coronary. Subsequently he has had no angina or shortness of breath. No side effects to medications. No bleeding. He denies arrhythmia/palpitations. He has not had syncope.   Wt Readings from Last 3 Encounters:  03/10/14 169 lb (76.658 kg)  02/27/14 167 lb 12.8 oz (76.114 kg)  02/27/14 167 lb 12.8 oz (76.114 kg)     Past Medical History  Diagnosis Date  . Asthma   . CAD (coronary artery disease)     a. 01/2014 Inf STEMI/PCI: LM nl, LAD 50p/m, LCX 30-87m, OM1 50, OM2/3/4 nl, RCA (anomalous) 100p (3.5x23 Xience Alpine DES), EF 60;   . HTN (hypertension)   . Ischemic cardiomyopathy     ab. 01/2014 Echo: EF 45-50%, inferoseptal/inf sev HK, Gr 1 DD, nl valves.    Current Outpatient Prescriptions  Medication Sig Dispense Refill  . albuterol (PROVENTIL HFA;VENTOLIN HFA) 108 (90 BASE) MCG/ACT  inhaler Inhale 2 puffs into the lungs every 6 (six) hours as needed for wheezing or shortness of breath.      Marland Kitchen aspirin EC 81 MG EC tablet Take 1 tablet (81 mg total) by mouth daily.      Marland Kitchen atorvastatin (LIPITOR) 80 MG tablet Take 1 tablet (80 mg total) by mouth daily at 6 PM.  30 tablet  6  . budesonide (PULMICORT) 180 MCG/ACT inhaler Inhale 1 puff into the lungs 2 (two) times daily as needed (shortness of breath).      . fexofenadine (ALLEGRA) 180 MG tablet Take 180 mg by mouth daily.      . metoprolol tartrate (LOPRESSOR) 25 MG tablet Take 1 tablet (25 mg total) by mouth 2 (two) times daily.  60 tablet  6  . nitroGLYCERIN (NITROSTAT) 0.4 MG SL tablet Place 1 tablet (0.4 mg total) under the tongue every 5 (five) minutes x 3 doses as needed for chest pain.  25 tablet  3  . ticagrelor (BRILINTA) 90 MG TABS tablet Take 1 tablet (90 mg total) by mouth 2 (two) times daily.  60 tablet  6   No current facility-administered medications for this visit.    Allergies:   No Known Allergies  Social History:  The patient  reports that he has never smoked. He does not have any smokeless tobacco history on file. He reports that he drinks alcohol.   ROS:  Please see the history of present illness.  No blood in the urine or stool. He denies orthopnea. There is no claudication. There is no right radial complication from PCI cath site.   All other systems reviewed and negative.   OBJECTIVE: VS:  BP 132/76  Pulse 49  Ht 5\' 8"  (1.727 m)  Wt 169 lb (76.658 kg)  BMI 25.70 kg/m2  SpO2 98% Well nourished, well developed, in no acute distress, appearing younger than stated age  91: normal Neck: JVD flat. Carotid bruit absent  Cardiac:  normal S1, S2; RRR; no murmur Lungs:  clear to auscultation bilaterally, no wheezing, rhonchi or rales Abd: soft, nontender, no hepatomegaly Ext: Edema none. Pulses 2+  Skin: warm and dry Neuro:  CNs 2-12 intact, no focal abnormalities noted  EKG:  Not repeated        Signed, Illene Labrador III, MD 03/10/2014 11:10 AM

## 2014-04-05 ENCOUNTER — Encounter (HOSPITAL_COMMUNITY): Payer: Self-pay

## 2014-04-05 ENCOUNTER — Encounter (HOSPITAL_COMMUNITY)
Admission: RE | Admit: 2014-04-05 | Discharge: 2014-04-05 | Disposition: A | Discharge status: Home / Self Care | Payer: BC Managed Care – PPO | Source: Ambulatory Visit | Attending: Interventional Cardiology | Admitting: Interventional Cardiology

## 2014-04-05 VITALS — BP 140/90 | HR 49 | Ht 68.0 in | Wt 169.9 lb

## 2014-04-05 DIAGNOSIS — Z5189 Encounter for other specified aftercare: Secondary | ICD-10-CM | POA: Insufficient documentation

## 2014-04-05 DIAGNOSIS — Z955 Presence of coronary angioplasty implant and graft: Secondary | ICD-10-CM

## 2014-04-05 DIAGNOSIS — I219 Acute myocardial infarction, unspecified: Secondary | ICD-10-CM | POA: Insufficient documentation

## 2014-04-05 DIAGNOSIS — Z9861 Coronary angioplasty status: Secondary | ICD-10-CM | POA: Insufficient documentation

## 2014-04-05 NOTE — Progress Notes (Signed)
Patient referred to CR by Dr. Daneen Schick due to STEMI 410.90 and Coronary Stent placement V45.82. During orientation advised patient on arrival and appointment times what to wear, what to do before, during and after exercise. Reviewed attendance and class policy. Talked about inclement weather and class consultation policy. Pt is scheduled to start Cardiac Rehab on 04/11/14 at 9:30pm. Pt was advised to come to class 5 minutes before class starts. He was also given instructions on meeting with the dietician and attending the Family Structure classes. Pt is eager to get started. Patient was able to complete the 6 minute walk test.

## 2014-04-05 NOTE — Patient Instructions (Signed)
Pt has finished orientation and is scheduled to start CR on 04/11/14 at 9:30. Pt has been instructed to arrive to class 15 minutes early for scheduled class. Pt has been instructed to wear comfortable clothing and shoes with rubber soles. Pt has been told to take their medications 1 hour prior to coming to class.  If the patient is not going to attend class, he/she has been instructed to call.

## 2014-04-11 ENCOUNTER — Encounter (HOSPITAL_COMMUNITY)
Admission: RE | Admit: 2014-04-11 | Discharge: 2014-04-11 | Disposition: A | Discharge status: Home / Self Care | Payer: BC Managed Care – PPO | Source: Ambulatory Visit | Attending: Interventional Cardiology | Admitting: Interventional Cardiology

## 2014-04-11 DIAGNOSIS — Z9861 Coronary angioplasty status: Secondary | ICD-10-CM | POA: Diagnosis not present

## 2014-04-11 DIAGNOSIS — Z5189 Encounter for other specified aftercare: Secondary | ICD-10-CM | POA: Diagnosis not present

## 2014-04-11 DIAGNOSIS — I219 Acute myocardial infarction, unspecified: Secondary | ICD-10-CM | POA: Diagnosis not present

## 2014-04-13 ENCOUNTER — Encounter (HOSPITAL_COMMUNITY)
Admission: RE | Admit: 2014-04-13 | Discharge: 2014-04-13 | Disposition: A | Discharge status: Home / Self Care | Payer: BC Managed Care – PPO | Source: Ambulatory Visit | Attending: Interventional Cardiology | Admitting: Interventional Cardiology

## 2014-04-13 DIAGNOSIS — Z5189 Encounter for other specified aftercare: Secondary | ICD-10-CM | POA: Diagnosis not present

## 2014-04-15 ENCOUNTER — Encounter (HOSPITAL_COMMUNITY)
Admission: RE | Admit: 2014-04-15 | Discharge: 2014-04-15 | Disposition: A | Discharge status: Home / Self Care | Payer: BC Managed Care – PPO | Source: Ambulatory Visit | Attending: Interventional Cardiology | Admitting: Interventional Cardiology

## 2014-04-15 DIAGNOSIS — Z5189 Encounter for other specified aftercare: Secondary | ICD-10-CM | POA: Diagnosis not present

## 2014-04-18 ENCOUNTER — Encounter (HOSPITAL_COMMUNITY)
Admission: RE | Admit: 2014-04-18 | Discharge: 2014-04-18 | Disposition: A | Discharge status: Home / Self Care | Payer: BC Managed Care – PPO | Source: Ambulatory Visit | Attending: Interventional Cardiology | Admitting: Interventional Cardiology

## 2014-04-18 DIAGNOSIS — Z5189 Encounter for other specified aftercare: Secondary | ICD-10-CM | POA: Diagnosis not present

## 2014-04-20 ENCOUNTER — Encounter (HOSPITAL_COMMUNITY)
Admission: RE | Admit: 2014-04-20 | Discharge: 2014-04-20 | Disposition: A | Discharge status: Home / Self Care | Payer: BC Managed Care – PPO | Source: Ambulatory Visit | Attending: Interventional Cardiology | Admitting: Interventional Cardiology

## 2014-04-20 DIAGNOSIS — Z5189 Encounter for other specified aftercare: Secondary | ICD-10-CM | POA: Diagnosis not present

## 2014-04-22 ENCOUNTER — Encounter (HOSPITAL_COMMUNITY)
Admission: RE | Admit: 2014-04-22 | Discharge: 2014-04-22 | Disposition: A | Discharge status: Home / Self Care | Payer: BC Managed Care – PPO | Source: Ambulatory Visit | Attending: Interventional Cardiology | Admitting: Interventional Cardiology

## 2014-04-22 DIAGNOSIS — Z5189 Encounter for other specified aftercare: Secondary | ICD-10-CM | POA: Diagnosis not present

## 2014-04-25 ENCOUNTER — Encounter (HOSPITAL_COMMUNITY)
Admission: RE | Admit: 2014-04-25 | Discharge: 2014-04-25 | Disposition: A | Discharge status: Home / Self Care | Payer: BC Managed Care – PPO | Source: Ambulatory Visit | Attending: Interventional Cardiology | Admitting: Interventional Cardiology

## 2014-04-25 DIAGNOSIS — Z5189 Encounter for other specified aftercare: Secondary | ICD-10-CM | POA: Diagnosis not present

## 2014-04-27 ENCOUNTER — Encounter (HOSPITAL_COMMUNITY): Payer: BC Managed Care – PPO

## 2014-04-29 ENCOUNTER — Encounter (HOSPITAL_COMMUNITY)
Admission: RE | Admit: 2014-04-29 | Discharge: 2014-04-29 | Disposition: A | Discharge status: Home / Self Care | Payer: BC Managed Care – PPO | Source: Ambulatory Visit | Attending: Interventional Cardiology | Admitting: Interventional Cardiology

## 2014-04-29 DIAGNOSIS — Z5189 Encounter for other specified aftercare: Secondary | ICD-10-CM | POA: Diagnosis not present

## 2014-05-02 ENCOUNTER — Encounter (HOSPITAL_COMMUNITY)
Admission: RE | Admit: 2014-05-02 | Discharge: 2014-05-02 | Disposition: A | Discharge status: Home / Self Care | Payer: BC Managed Care – PPO | Source: Ambulatory Visit | Attending: Interventional Cardiology | Admitting: Interventional Cardiology

## 2014-05-02 DIAGNOSIS — Z5189 Encounter for other specified aftercare: Secondary | ICD-10-CM | POA: Diagnosis not present

## 2014-05-04 ENCOUNTER — Encounter (HOSPITAL_COMMUNITY)
Admission: RE | Admit: 2014-05-04 | Discharge: 2014-05-04 | Disposition: A | Discharge status: Home / Self Care | Payer: BC Managed Care – PPO | Source: Ambulatory Visit | Attending: Interventional Cardiology | Admitting: Interventional Cardiology

## 2014-05-04 DIAGNOSIS — Z9861 Coronary angioplasty status: Secondary | ICD-10-CM | POA: Insufficient documentation

## 2014-05-04 DIAGNOSIS — I219 Acute myocardial infarction, unspecified: Secondary | ICD-10-CM | POA: Diagnosis not present

## 2014-05-04 DIAGNOSIS — Z5189 Encounter for other specified aftercare: Secondary | ICD-10-CM | POA: Insufficient documentation

## 2014-05-06 ENCOUNTER — Encounter (HOSPITAL_COMMUNITY): Payer: BC Managed Care – PPO

## 2014-05-09 ENCOUNTER — Encounter (HOSPITAL_COMMUNITY): Payer: BC Managed Care – PPO

## 2014-05-11 ENCOUNTER — Encounter (HOSPITAL_COMMUNITY)
Admission: RE | Admit: 2014-05-11 | Discharge: 2014-05-11 | Disposition: A | Discharge status: Home / Self Care | Payer: BC Managed Care – PPO | Source: Ambulatory Visit | Attending: Interventional Cardiology | Admitting: Interventional Cardiology

## 2014-05-11 DIAGNOSIS — Z5189 Encounter for other specified aftercare: Secondary | ICD-10-CM | POA: Diagnosis not present

## 2014-05-13 ENCOUNTER — Encounter (HOSPITAL_COMMUNITY): Payer: BC Managed Care – PPO

## 2014-05-16 ENCOUNTER — Telehealth: Payer: Self-pay | Admitting: Interventional Cardiology

## 2014-05-16 ENCOUNTER — Encounter (HOSPITAL_COMMUNITY)
Admission: RE | Admit: 2014-05-16 | Discharge: 2014-05-16 | Disposition: A | Discharge status: Home / Self Care | Payer: BC Managed Care – PPO | Source: Ambulatory Visit | Attending: Interventional Cardiology | Admitting: Interventional Cardiology

## 2014-05-16 DIAGNOSIS — Z5189 Encounter for other specified aftercare: Secondary | ICD-10-CM | POA: Diagnosis not present

## 2014-05-16 NOTE — Telephone Encounter (Signed)
Route to Dr.Smith for adv

## 2014-05-16 NOTE — Telephone Encounter (Signed)
Yes, assuming that the patient is doing well and having no symptoms.

## 2014-05-16 NOTE — Telephone Encounter (Signed)
lmom.per Dr.Smith ok to resumr playing golf ,assuming that the patient is doing well and having no symptoms.

## 2014-05-16 NOTE — Telephone Encounter (Signed)
New message      Pt had a heart attack in June----can he start playing golf again?  OK to leave a msg on vm

## 2014-05-18 ENCOUNTER — Encounter (HOSPITAL_COMMUNITY)
Admission: RE | Admit: 2014-05-18 | Discharge: 2014-05-18 | Disposition: A | Discharge status: Home / Self Care | Payer: BC Managed Care – PPO | Source: Ambulatory Visit | Attending: Interventional Cardiology | Admitting: Interventional Cardiology

## 2014-05-18 DIAGNOSIS — Z5189 Encounter for other specified aftercare: Secondary | ICD-10-CM | POA: Diagnosis not present

## 2014-05-20 ENCOUNTER — Encounter (HOSPITAL_COMMUNITY)
Admission: RE | Admit: 2014-05-20 | Discharge: 2014-05-20 | Disposition: A | Discharge status: Home / Self Care | Payer: BC Managed Care – PPO | Source: Ambulatory Visit | Attending: Interventional Cardiology | Admitting: Interventional Cardiology

## 2014-05-20 DIAGNOSIS — Z5189 Encounter for other specified aftercare: Secondary | ICD-10-CM | POA: Diagnosis not present

## 2014-05-23 ENCOUNTER — Encounter (HOSPITAL_COMMUNITY)
Admission: RE | Admit: 2014-05-23 | Discharge: 2014-05-23 | Disposition: A | Discharge status: Home / Self Care | Payer: BC Managed Care – PPO | Source: Ambulatory Visit | Attending: Interventional Cardiology | Admitting: Interventional Cardiology

## 2014-05-23 DIAGNOSIS — Z5189 Encounter for other specified aftercare: Secondary | ICD-10-CM | POA: Diagnosis not present

## 2014-05-25 ENCOUNTER — Encounter (HOSPITAL_COMMUNITY)
Admission: RE | Admit: 2014-05-25 | Discharge: 2014-05-25 | Disposition: A | Discharge status: Home / Self Care | Payer: BC Managed Care – PPO | Source: Ambulatory Visit | Attending: Interventional Cardiology | Admitting: Interventional Cardiology

## 2014-05-25 DIAGNOSIS — Z5189 Encounter for other specified aftercare: Secondary | ICD-10-CM | POA: Diagnosis not present

## 2014-05-27 ENCOUNTER — Encounter (HOSPITAL_COMMUNITY): Payer: BC Managed Care – PPO

## 2014-05-30 ENCOUNTER — Encounter (HOSPITAL_COMMUNITY)
Admission: RE | Admit: 2014-05-30 | Discharge: 2014-05-30 | Disposition: A | Discharge status: Home / Self Care | Payer: BC Managed Care – PPO | Source: Ambulatory Visit | Attending: Interventional Cardiology | Admitting: Interventional Cardiology

## 2014-05-30 DIAGNOSIS — Z5189 Encounter for other specified aftercare: Secondary | ICD-10-CM | POA: Diagnosis not present

## 2014-06-01 ENCOUNTER — Encounter (HOSPITAL_COMMUNITY): Payer: BC Managed Care – PPO

## 2014-06-03 ENCOUNTER — Encounter (HOSPITAL_COMMUNITY)
Admission: RE | Admit: 2014-06-03 | Discharge: 2014-06-03 | Disposition: A | Discharge status: Home / Self Care | Payer: BC Managed Care – PPO | Source: Ambulatory Visit | Attending: Interventional Cardiology | Admitting: Interventional Cardiology

## 2014-06-03 DIAGNOSIS — I2510 Atherosclerotic heart disease of native coronary artery without angina pectoris: Secondary | ICD-10-CM | POA: Diagnosis not present

## 2014-06-03 DIAGNOSIS — E785 Hyperlipidemia, unspecified: Secondary | ICD-10-CM | POA: Insufficient documentation

## 2014-06-03 DIAGNOSIS — Z7902 Long term (current) use of antithrombotics/antiplatelets: Secondary | ICD-10-CM | POA: Insufficient documentation

## 2014-06-03 DIAGNOSIS — Z955 Presence of coronary angioplasty implant and graft: Secondary | ICD-10-CM | POA: Insufficient documentation

## 2014-06-06 ENCOUNTER — Encounter (HOSPITAL_COMMUNITY)
Admission: RE | Admit: 2014-06-06 | Discharge: 2014-06-06 | Disposition: A | Discharge status: Home / Self Care | Payer: BC Managed Care – PPO | Source: Ambulatory Visit | Attending: Interventional Cardiology | Admitting: Interventional Cardiology

## 2014-06-06 DIAGNOSIS — I2510 Atherosclerotic heart disease of native coronary artery without angina pectoris: Secondary | ICD-10-CM | POA: Diagnosis not present

## 2014-06-08 ENCOUNTER — Encounter (HOSPITAL_COMMUNITY)
Admission: RE | Admit: 2014-06-08 | Discharge: 2014-06-08 | Disposition: A | Discharge status: Home / Self Care | Payer: BC Managed Care – PPO | Source: Ambulatory Visit | Attending: Interventional Cardiology | Admitting: Interventional Cardiology

## 2014-06-08 DIAGNOSIS — I2510 Atherosclerotic heart disease of native coronary artery without angina pectoris: Secondary | ICD-10-CM | POA: Diagnosis not present

## 2014-06-10 ENCOUNTER — Encounter (HOSPITAL_COMMUNITY): Payer: BC Managed Care – PPO

## 2014-06-13 ENCOUNTER — Encounter (HOSPITAL_COMMUNITY)
Admission: RE | Admit: 2014-06-13 | Discharge: 2014-06-13 | Disposition: A | Discharge status: Home / Self Care | Payer: BC Managed Care – PPO | Source: Ambulatory Visit | Attending: Interventional Cardiology | Admitting: Interventional Cardiology

## 2014-06-13 DIAGNOSIS — I2510 Atherosclerotic heart disease of native coronary artery without angina pectoris: Secondary | ICD-10-CM | POA: Diagnosis not present

## 2014-06-15 ENCOUNTER — Encounter (HOSPITAL_COMMUNITY)
Admission: RE | Admit: 2014-06-15 | Discharge: 2014-06-15 | Disposition: A | Discharge status: Home / Self Care | Payer: BC Managed Care – PPO | Source: Ambulatory Visit | Attending: Interventional Cardiology | Admitting: Interventional Cardiology

## 2014-06-15 DIAGNOSIS — I2510 Atherosclerotic heart disease of native coronary artery without angina pectoris: Secondary | ICD-10-CM | POA: Diagnosis not present

## 2014-06-17 ENCOUNTER — Encounter (HOSPITAL_COMMUNITY)
Admission: RE | Admit: 2014-06-17 | Discharge: 2014-06-17 | Disposition: A | Discharge status: Home / Self Care | Payer: BC Managed Care – PPO | Source: Ambulatory Visit | Attending: Interventional Cardiology | Admitting: Interventional Cardiology

## 2014-06-17 DIAGNOSIS — I2510 Atherosclerotic heart disease of native coronary artery without angina pectoris: Secondary | ICD-10-CM | POA: Diagnosis not present

## 2014-06-20 ENCOUNTER — Encounter (HOSPITAL_COMMUNITY)
Admission: RE | Admit: 2014-06-20 | Discharge: 2014-06-20 | Disposition: A | Discharge status: Home / Self Care | Payer: BC Managed Care – PPO | Source: Ambulatory Visit | Attending: Interventional Cardiology | Admitting: Interventional Cardiology

## 2014-06-20 DIAGNOSIS — I2510 Atherosclerotic heart disease of native coronary artery without angina pectoris: Secondary | ICD-10-CM | POA: Diagnosis not present

## 2014-06-21 ENCOUNTER — Ambulatory Visit (INDEPENDENT_AMBULATORY_CARE_PROVIDER_SITE_OTHER): Payer: BC Managed Care – PPO | Admitting: Interventional Cardiology

## 2014-06-21 ENCOUNTER — Encounter: Payer: Self-pay | Admitting: Interventional Cardiology

## 2014-06-21 VITALS — BP 142/86 | HR 72 | Ht 68.0 in | Wt 170.1 lb

## 2014-06-21 DIAGNOSIS — I2510 Atherosclerotic heart disease of native coronary artery without angina pectoris: Secondary | ICD-10-CM

## 2014-06-21 DIAGNOSIS — I255 Ischemic cardiomyopathy: Secondary | ICD-10-CM

## 2014-06-21 DIAGNOSIS — E785 Hyperlipidemia, unspecified: Secondary | ICD-10-CM

## 2014-06-21 LAB — LIPID PANEL
Cholesterol: 115 mg/dL (ref 0–200)
HDL: 31.9 mg/dL — ABNORMAL LOW (ref >39.00)
LDL Cholesterol: 64 mg/dL (ref 0–99)
NonHDL: 83.1
Total CHOL/HDL Ratio: 4
Triglycerides: 95 mg/dL (ref 0.0–149.0)
VLDL: 19 mg/dL (ref 0.0–40.0)

## 2014-06-21 LAB — ALT: ALT: 36 U/L (ref 0–53)

## 2014-06-21 NOTE — Patient Instructions (Signed)
Your physician recommends that you continue on your current medications as directed. Please refer to the Current Medication list given to you today.  Lab Today: [facility] physician wants you to follow-up in: 4 months with Dr.Smith You will receive a reminder letter in the mail two months in advance. If you don't receive a letter, please call our office to schedule the follow-up appointment.

## 2014-06-21 NOTE — Progress Notes (Signed)
Patient ID: Eric Cortez, male   DOB: 09-Jul-1951, 63 y.o.   MRN: [id number]    [address], [address] [place], [place]  [address] Phone: [phone] Fax:  [phone]  Date:  06/21/2014   ID:  Eric Cortez, DOB 1950/12/15, MRN [id number]  PCP:  Curlene Labrum, MD   ASSESSMENT:  1. Coronary artery disease, stable after stenting of the right coronary. Acute infarction in June. No recurrence of angina. 2. Hyperlipidemia without blood work checked since starting statin 3. Dual antiplatelet therapy without complications or side effects   PLAN:  1. increase physical activity. 2. Statin panel today 3. Clinical followup in 3-4 months 4. Call if clinical problems. Our goal is to discontinue Brilinta in June of 2016   SUBJECTIVE: FARHAAN MABEE is a 63 y.o. male who is doing well. He is progressing in cardiac rehabilitation. There've been no recurrences of chest discomfort the patient concerning. No medication side effects.   Wt Readings from Last 3 Encounters:  06/21/14 170 lb 1.9 oz (77.166 kg)  04/05/14 169 lb 14.4 oz (77.066 kg)  03/10/14 169 lb (76.658 kg)     Past Medical History  Diagnosis Date  . Asthma   . CAD (coronary artery disease)     a. 01/2014 Inf STEMI/PCI: LM nl, LAD 50p/m, LCX 30-16m, OM1 50, OM2/3/4 nl, RCA (anomalous) 100p (3.5x23 Xience Alpine DES), EF 60;   . HTN (hypertension)   . Ischemic cardiomyopathy     ab. 01/2014 Echo: EF 45-50%, inferoseptal/inf sev HK, Gr 1 DD, nl valves.    Current Outpatient Prescriptions  Medication Sig Dispense Refill  . albuterol (PROVENTIL HFA;VENTOLIN HFA) 108 (90 BASE) MCG/ACT inhaler Inhale 2 puffs into the lungs every 6 (six) hours as needed for wheezing or shortness of breath.      Marland Kitchen aspirin EC 81 MG EC tablet Take 1 tablet (81 mg total) by mouth daily.      Marland Kitchen atorvastatin (LIPITOR) 80 MG tablet Take 1 tablet (80 mg total) by mouth daily at 6 PM.  30 tablet  6  . budesonide (PULMICORT) 180 MCG/ACT inhaler  Inhale 1 puff into the lungs 2 (two) times daily as needed (shortness of breath).      . fexofenadine (ALLEGRA) 180 MG tablet Take 180 mg by mouth daily.      . metoprolol tartrate (LOPRESSOR) 25 MG tablet Take 1 tablet (25 mg total) by mouth 2 (two) times daily.  60 tablet  6  . nitroGLYCERIN (NITROSTAT) 0.4 MG SL tablet Place 1 tablet (0.4 mg total) under the tongue every 5 (five) minutes x 3 doses as needed for chest pain.  25 tablet  3  . ticagrelor (BRILINTA) 90 MG TABS tablet Take 1 tablet (90 mg total) by mouth 2 (two) times daily.  60 tablet  6   No current facility-administered medications for this visit.    Allergies:   No Known Allergies  Social History:  The patient  reports that he has never smoked. He does not have any smokeless tobacco history on file. He reports that he drinks about 1.8 ounces of alcohol per week.   ROS:  Please see the history of present illness.   No blood in urine or stool   All other systems reviewed and negative.   OBJECTIVE: VS:  BP 142/86  Pulse 72  Ht 5\' 8"  (1.727 m)  Wt 170 lb 1.9 oz (77.166 kg)  BMI 25.87 kg/m2  SpO2 96% Well nourished,  well developed, in no acute distress, healthy-appearing  HEENT: normal Neck: JVD flat. Carotid bruit absent  Cardiac:  normal S1, S2; RRR; no murmur Lungs:  clear to auscultation bilaterally, no wheezing, rhonchi or rales Abd: soft, nontender, no hepatomegaly Ext: Edema absent. Pulses 2+  Skin: warm and dry Neuro:  CNs 2-12 intact, no focal abnormalities noted  EKG:  Not performed       Signed, Illene Labrador III, MD 06/21/2014 12:06 PM

## 2014-06-22 ENCOUNTER — Encounter (HOSPITAL_COMMUNITY): Payer: BC Managed Care – PPO

## 2014-06-24 ENCOUNTER — Encounter (HOSPITAL_COMMUNITY)
Admission: RE | Admit: 2014-06-24 | Discharge: 2014-06-24 | Disposition: A | Discharge status: Home / Self Care | Payer: BC Managed Care – PPO | Source: Ambulatory Visit | Attending: Interventional Cardiology | Admitting: Interventional Cardiology

## 2014-06-24 DIAGNOSIS — I2510 Atherosclerotic heart disease of native coronary artery without angina pectoris: Secondary | ICD-10-CM | POA: Diagnosis not present

## 2014-06-27 ENCOUNTER — Encounter (HOSPITAL_COMMUNITY)
Admission: RE | Admit: 2014-06-27 | Discharge: 2014-06-27 | Disposition: A | Discharge status: Home / Self Care | Payer: BC Managed Care – PPO | Source: Ambulatory Visit | Attending: Interventional Cardiology | Admitting: Interventional Cardiology

## 2014-06-27 DIAGNOSIS — I2510 Atherosclerotic heart disease of native coronary artery without angina pectoris: Secondary | ICD-10-CM | POA: Diagnosis not present

## 2014-06-27 NOTE — Progress Notes (Signed)
Cardiac Rehabilitation Program Outcomes Report   Orientation:  04/05/14 Graduate Date:  tbd Discharge Date:  tbd # of sessions completed: 3  Cardiologist: Sharyn Blitz Family MD:  [place] Class Time:  0930  A.  Exercise Program:  Tolerates exercise @ 3.52 METS for 15 minutes and Walk Test Results:  Pre: 3.4 mets  B.  Mental Health:  Good mental attitude  C.  Education/Instruction/Skills  Accurately checks own pulse.  Rest:  53  Exercise:  110  Uses Perceived Exertion Scale and/or Dyspnea Scale  D.  Nutrition/Weight Control/Body Composition:  Adherence to prescribed nutrition program: good    E.  Blood Lipids    Lab Results  Component Value Date   CHOL 115 06/21/2014   HDL 31.90* 06/21/2014   LDLCALC 64 06/21/2014   TRIG 95.0 06/21/2014   CHOLHDL 4 06/21/2014    F.  Lifestyle Changes:  Making positive lifestyle changes  G.  Symptoms noted with exercise:  Asymptomatic  Report Completed By:  Benay Pillow RN   Comments:  First week progress note.

## 2014-06-27 NOTE — Progress Notes (Signed)
Cardiac Rehabilitation Program Outcomes Report   Orientation:  04/05/14 Graduate Date:  tbd Discharge Date:  tbd # of sessions completed: 18  Cardiologist: Sharyn Blitz Family MD:  [place]ine Class Time:  0930  A.  Exercise Program:  Tolerates exercise @ 5.5 METS for 15 minutes  B.  Mental Health:  Good mental attitude  C.  Education/Instruction/Skills  Accurately checks own pulse.  Rest:  53  Exercise:  117 and Knows THR for exercise  Uses Perceived Exertion Scale and/or Dyspnea Scale  D.  Nutrition/Weight Control/Body Composition:  Adherence to prescribed nutrition program: good    E.  Blood Lipids    Lab Results  Component Value Date   CHOL 115 06/21/2014   HDL 31.90* 06/21/2014   LDLCALC 64 06/21/2014   TRIG 95.0 06/21/2014   CHOLHDL 4 06/21/2014    F.  Lifestyle Changes:  Making positive lifestyle changes  G.  Symptoms noted with exercise:  Asymptomatic  Report Completed By:  Benay Pillow RN   Comments:  Halfway progress note.

## 2014-06-29 ENCOUNTER — Encounter (HOSPITAL_COMMUNITY)
Admission: RE | Admit: 2014-06-29 | Discharge: 2014-06-29 | Disposition: A | Discharge status: Home / Self Care | Payer: BC Managed Care – PPO | Source: Ambulatory Visit | Attending: Interventional Cardiology | Admitting: Interventional Cardiology

## 2014-06-29 ENCOUNTER — Telehealth: Payer: Self-pay

## 2014-06-29 DIAGNOSIS — E785 Hyperlipidemia, unspecified: Secondary | ICD-10-CM

## 2014-06-29 DIAGNOSIS — I2510 Atherosclerotic heart disease of native coronary artery without angina pectoris: Secondary | ICD-10-CM | POA: Diagnosis not present

## 2014-06-29 NOTE — Telephone Encounter (Signed)
returned pt call.called to give pt lab appt.lmtcb

## 2014-06-29 NOTE — Telephone Encounter (Signed)
called to give lab results. lmtcb 

## 2014-06-29 NOTE — Telephone Encounter (Signed)
Follow up  ° ° ° °Returning call back to nurse  °

## 2014-06-29 NOTE — Telephone Encounter (Signed)
Message copied by Lamar Laundry on Wed Jun 29, 2014 11:14 AM ------      Message from: Daneen Schick      Created: Wed Jun 22, 2014  4:46 PM       Liver is normal and lipids are at target. Repeat in 1 year. ------

## 2014-06-30 NOTE — Telephone Encounter (Signed)
pt aware of lab results.Liver is normal and lipids are at target. Repeat in 1 year.pt verbalized understanding.

## 2014-06-30 NOTE — Telephone Encounter (Signed)
Follow up ° ° ° ° ° °Returned [name]'s call ° °

## 2014-07-01 ENCOUNTER — Encounter (HOSPITAL_COMMUNITY)
Admission: RE | Admit: 2014-07-01 | Discharge: 2014-07-01 | Disposition: A | Discharge status: Home / Self Care | Payer: BC Managed Care – PPO | Source: Ambulatory Visit | Attending: Interventional Cardiology | Admitting: Interventional Cardiology

## 2014-07-01 DIAGNOSIS — I2510 Atherosclerotic heart disease of native coronary artery without angina pectoris: Secondary | ICD-10-CM | POA: Diagnosis not present

## 2014-07-01 NOTE — Telephone Encounter (Signed)
Follow up          Pt wife is calling to see if pt is suppose to decrease Lipitor because of muscle pain

## 2014-07-01 NOTE — Telephone Encounter (Signed)
Returned pt call.adv pt that at his last o/v with Dr.Smith it was not indicating that he was having any mediaction side effects. Pt sts that he is having muscle and joint pain, I would like to know if Dr.Smith can reduce his Atorvastatin dosage. Adv him that Dr.Smith is out of the office, i will fwd him a message and call back with his recommendation. Pt verbalized understanding.

## 2014-07-04 ENCOUNTER — Encounter (HOSPITAL_COMMUNITY)
Admission: RE | Admit: 2014-07-04 | Discharge: 2014-07-04 | Disposition: A | Discharge status: Home / Self Care | Payer: BC Managed Care – PPO | Source: Ambulatory Visit | Attending: Interventional Cardiology | Admitting: Interventional Cardiology

## 2014-07-04 DIAGNOSIS — E785 Hyperlipidemia, unspecified: Secondary | ICD-10-CM | POA: Insufficient documentation

## 2014-07-04 DIAGNOSIS — I2510 Atherosclerotic heart disease of native coronary artery without angina pectoris: Secondary | ICD-10-CM | POA: Diagnosis not present

## 2014-07-04 DIAGNOSIS — Z7902 Long term (current) use of antithrombotics/antiplatelets: Secondary | ICD-10-CM | POA: Insufficient documentation

## 2014-07-04 DIAGNOSIS — Z955 Presence of coronary angioplasty implant and graft: Secondary | ICD-10-CM | POA: Insufficient documentation

## 2014-07-04 MED ORDER — ATORVASTATIN CALCIUM 40 MG PO TABS: 40.0000 mg | ORAL_TABLET | Freq: Every day | ORAL | Status: DC

## 2014-07-04 NOTE — Telephone Encounter (Signed)
It is perfectly fine for the patient to decrease atorvastatin 40 mg per day. He will need to have a lipid panel with a LT performed 2-3 months later. He should keep [place] posted with whether or not the muscle aches improves. Also advised him to start 300 mg daily of CoQ10.

## 2014-07-04 NOTE — Telephone Encounter (Signed)
Pt given Dr.Smith recommendation. It is perfectly fine for the patient to decrease atorvastatin 40 mg per day. He will need to have a lipid panel with a ALT performed 2-3 months later. He should keep [place] posted with whether or not the muscle aches improves. Also advised him to start 300 mg daily of CoQ10. Pt request recall letter to call back to schedule f/u lab Pt agreeable with plan and verbalized understanding.

## 2014-07-05 ENCOUNTER — Ambulatory Visit (INDEPENDENT_AMBULATORY_CARE_PROVIDER_SITE_OTHER): Payer: BC Managed Care – PPO | Admitting: Urology

## 2014-07-05 DIAGNOSIS — R972 Elevated prostate specific antigen [PSA]: Secondary | ICD-10-CM

## 2014-07-05 DIAGNOSIS — N400 Enlarged prostate without lower urinary tract symptoms: Secondary | ICD-10-CM

## 2014-07-06 ENCOUNTER — Encounter (HOSPITAL_COMMUNITY): Payer: BC Managed Care – PPO

## 2014-07-06 DIAGNOSIS — I2510 Atherosclerotic heart disease of native coronary artery without angina pectoris: Secondary | ICD-10-CM | POA: Diagnosis not present

## 2014-07-08 ENCOUNTER — Encounter (HOSPITAL_COMMUNITY): Payer: BC Managed Care – PPO

## 2014-07-11 ENCOUNTER — Encounter (HOSPITAL_COMMUNITY)
Admission: RE | Admit: 2014-07-11 | Discharge: 2014-07-11 | Disposition: A | Discharge status: Home / Self Care | Payer: BC Managed Care – PPO | Source: Ambulatory Visit | Attending: Interventional Cardiology | Admitting: Interventional Cardiology

## 2014-07-11 DIAGNOSIS — I2510 Atherosclerotic heart disease of native coronary artery without angina pectoris: Secondary | ICD-10-CM | POA: Diagnosis not present

## 2014-07-13 ENCOUNTER — Encounter (HOSPITAL_COMMUNITY)
Admission: RE | Admit: 2014-07-13 | Discharge: 2014-07-13 | Disposition: A | Discharge status: Home / Self Care | Payer: BC Managed Care – PPO | Source: Ambulatory Visit | Attending: Interventional Cardiology | Admitting: Interventional Cardiology

## 2014-07-13 DIAGNOSIS — I2510 Atherosclerotic heart disease of native coronary artery without angina pectoris: Secondary | ICD-10-CM | POA: Diagnosis not present

## 2014-07-15 ENCOUNTER — Encounter (HOSPITAL_COMMUNITY)
Admission: RE | Admit: 2014-07-15 | Discharge: 2014-07-15 | Disposition: A | Discharge status: Home / Self Care | Payer: BC Managed Care – PPO | Source: Ambulatory Visit | Attending: Interventional Cardiology | Admitting: Interventional Cardiology

## 2014-07-15 DIAGNOSIS — I2510 Atherosclerotic heart disease of native coronary artery without angina pectoris: Secondary | ICD-10-CM | POA: Diagnosis not present

## 2014-07-18 ENCOUNTER — Encounter (HOSPITAL_COMMUNITY)
Admission: RE | Admit: 2014-07-18 | Discharge: 2014-07-18 | Disposition: A | Discharge status: Home / Self Care | Payer: BC Managed Care – PPO | Source: Ambulatory Visit | Attending: Interventional Cardiology | Admitting: Interventional Cardiology

## 2014-07-18 DIAGNOSIS — I2510 Atherosclerotic heart disease of native coronary artery without angina pectoris: Secondary | ICD-10-CM | POA: Diagnosis not present

## 2014-07-18 NOTE — Progress Notes (Signed)
Patient graduated from [facility] today on 07/22/2014 after completing 36 sessions. He achieved LTG of 30 minutes of aerobic exercise at Max Met level of 5.1. All patients vitals are WNL. Patient has met with dietician. Discharge instruction has been reviewed in detail and patient stated an understanding of material given. Patient plans to exercise at home on his Treadmill. Cardiac Rehab staff will make f/u calls at 1 month, 6 months, and 1 year. Patient had no complaints of any abnormal S/S or pain on their exit visit. Patient was able to complete 6 minute walk test.

## 2014-07-20 ENCOUNTER — Encounter (HOSPITAL_COMMUNITY)
Admission: RE | Admit: 2014-07-20 | Discharge: 2014-07-20 | Disposition: A | Discharge status: Home / Self Care | Payer: BC Managed Care – PPO | Source: Ambulatory Visit | Attending: Interventional Cardiology | Admitting: Interventional Cardiology

## 2014-07-20 DIAGNOSIS — I2510 Atherosclerotic heart disease of native coronary artery without angina pectoris: Secondary | ICD-10-CM | POA: Diagnosis not present

## 2014-07-22 ENCOUNTER — Encounter (HOSPITAL_COMMUNITY)
Admission: RE | Admit: 2014-07-22 | Discharge: 2014-07-22 | Disposition: A | Discharge status: Home / Self Care | Payer: BC Managed Care – PPO | Source: Ambulatory Visit | Attending: Interventional Cardiology | Admitting: Interventional Cardiology

## 2014-07-22 DIAGNOSIS — I2510 Atherosclerotic heart disease of native coronary artery without angina pectoris: Secondary | ICD-10-CM | POA: Diagnosis not present

## 2014-07-22 NOTE — Progress Notes (Signed)
Cardiac Rehabilitation Program Outcomes Report   Orientation:  04/05/14 Graduate Date:  07/22/14 Discharge Date:  07/22/14 # of sessions completed: 36  Cardiologist: Linard Millers Family MD:  Tobie Poet Time:  0930  A.  Exercise Program:  Tolerates exercise @ 4.8 METS for 15 minutes, Walk Test Results:  Post: 3.97 mets, Improved  muscular strength  3.45 %, Improved  flexibility 2.20 % and Improved education score 15 %  B.  Mental Health:  Good mental attitude  C.  Education/Instruction/Skills  Accurately checks own pulse.  Rest:  53  Exercise:  113, Knows THR for exercise, Uses Perceived Exertion Scale and/or Dyspnea Scale and Attended 13 education classes  Anticipated home compliance with respiratory program:  excellent  D.  Nutrition/Weight Control/Body Composition:  Adherence to prescribed nutrition program: good  and Patient has lost 0.3 kg   E.  Blood Lipids    Lab Results  Component Value Date   CHOL 115 06/21/2014   HDL 31.90* 06/21/2014   LDLCALC 64 06/21/2014   TRIG 95.0 06/21/2014   CHOLHDL 4 06/21/2014    F.  Lifestyle Changes:  Making positive lifestyle changes  G.  Symptoms noted with exercise:  Asymptomatic  Report Completed By:  Stevphen Rochester RN    Comments:  Patient has graduated Cardiac Rehab today with 36 sessions.  Patient has done well in the program and verbalized he feels better. Patient plans to exercise at home.

## 2014-08-11 ENCOUNTER — Encounter (HOSPITAL_COMMUNITY): Payer: Self-pay | Admitting: Interventional Cardiology

## 2014-08-29 ENCOUNTER — Other Ambulatory Visit: Payer: Self-pay | Admitting: Interventional Cardiology

## 2014-09-05 ENCOUNTER — Other Ambulatory Visit (INDEPENDENT_AMBULATORY_CARE_PROVIDER_SITE_OTHER): Payer: BC Managed Care – PPO | Admitting: *Deleted

## 2014-09-05 DIAGNOSIS — E785 Hyperlipidemia, unspecified: Secondary | ICD-10-CM

## 2014-09-05 LAB — LIPID PANEL
Cholesterol: 120 mg/dL (ref 0–200)
HDL: 32.7 mg/dL — ABNORMAL LOW (ref >39.00)
LDL Cholesterol: 71 mg/dL (ref 0–99)
NonHDL: 87.3
Total CHOL/HDL Ratio: 4
Triglycerides: 80 mg/dL (ref 0.0–149.0)
VLDL: 16 mg/dL (ref 0.0–40.0)

## 2014-09-05 LAB — ALT: ALT: 32 U/L (ref 0–53)

## 2014-09-07 ENCOUNTER — Other Ambulatory Visit: Payer: Self-pay | Admitting: *Deleted

## 2014-09-07 MED ORDER — TICAGRELOR 90 MG PO TABS: 90.0000 mg | ORAL_TABLET | Freq: Two times a day (BID) | ORAL | Status: DC

## 2014-09-07 MED ORDER — METOPROLOL TARTRATE 25 MG PO TABS: 25.0000 mg | ORAL_TABLET | Freq: Two times a day (BID) | ORAL | Status: DC

## 2014-09-07 MED ORDER — ATORVASTATIN CALCIUM 40 MG PO TABS: 40.0000 mg | ORAL_TABLET | Freq: Every day | ORAL | Status: DC

## 2014-09-19 ENCOUNTER — Other Ambulatory Visit: Payer: Self-pay | Admitting: Nurse Practitioner

## 2014-09-24 ENCOUNTER — Other Ambulatory Visit: Payer: Self-pay | Admitting: Nurse Practitioner

## 2014-10-19 ENCOUNTER — Ambulatory Visit (INDEPENDENT_AMBULATORY_CARE_PROVIDER_SITE_OTHER): Payer: BC Managed Care – PPO | Admitting: Interventional Cardiology

## 2014-10-19 ENCOUNTER — Encounter: Payer: Self-pay | Admitting: Interventional Cardiology

## 2014-10-19 VITALS — BP 130/70 | HR 77 | Ht 68.0 in | Wt 171.0 lb

## 2014-10-19 DIAGNOSIS — I2510 Atherosclerotic heart disease of native coronary artery without angina pectoris: Secondary | ICD-10-CM

## 2014-10-19 DIAGNOSIS — L509 Urticaria, unspecified: Secondary | ICD-10-CM

## 2014-10-19 DIAGNOSIS — R079 Chest pain, unspecified: Secondary | ICD-10-CM

## 2014-10-19 DIAGNOSIS — I255 Ischemic cardiomyopathy: Secondary | ICD-10-CM

## 2014-10-19 HISTORY — DX: Chest pain, unspecified: R07.9

## 2014-10-19 NOTE — Progress Notes (Signed)
Cardiology Office Note   Date:  10/19/2014   ID:  Eric Cortez, DOB Aug 11, 1951, MRN [id number]  PCP:  Curlene Labrum, MD  Cardiologist:   Sinclair Grooms, MD   Chief Complaint  Patient presents with  . 49MO F/U VISIT      History of Present Illness: Eric Cortez is a 64 y.o. male who presents for  CAD and had syncope after sl NTG for chest burning. Also notes hives on 3-4 occaisons since the MI. He seeks instruction about how to travel to [place] [facility]) to visit grandchildren. He also [place] he shouldn't drive or flight. He is concerned about flying because his myocardial infarction started while he was in the air on another trip.  The chest discomfort that he had that required nitroglycerin in January was a burning sensation totally dissimilar to his prior presentation. It is not recurred.  He describes recurring episodes of skin eruption that starts out with significant pruritus and turns into a hive. This happened 2 or 3 times over the past 6 months. He has had no facial or oral swelling.    Past Medical History  Diagnosis Date  . Asthma   . CAD (coronary artery disease)     a. 01/2014 Inf STEMI/PCI: LM nl, LAD 50p/m, LCX 30-108m, OM1 50, OM2/3/4 nl, RCA (anomalous) 100p (3.5x23 Xience Alpine DES), EF 60;   . HTN (hypertension)   . Ischemic cardiomyopathy     ab. 01/2014 Echo: EF 45-50%, inferoseptal/inf sev HK, Gr 1 DD, nl valves.    Past Surgical History  Procedure Laterality Date  . Coronary stent placement  02/25/14  . Left heart catheterization with coronary angiogram N/A 02/25/2014    Procedure: LEFT HEART CATHETERIZATION WITH CORONARY ANGIOGRAM;  Surgeon: Sinclair Grooms, MD;  Location: [facility] CATH LAB;  Service: Cardiovascular;  Laterality: N/A;  . Percutaneous coronary stent intervention (pci-s)  02/25/2014    Procedure: PERCUTANEOUS CORONARY STENT INTERVENTION (PCI-S);  Surgeon: Sinclair Grooms, MD;  Location: [facility] CATH LAB;  Service: Cardiovascular;;      Current Outpatient Prescriptions  Medication Sig Dispense Refill  . aspirin EC 81 MG EC tablet Take 1 tablet (81 mg total) by mouth daily.    Marland Kitchen atorvastatin (LIPITOR) 40 MG tablet Take 1 tablet (40 mg total) by mouth daily at 6 PM. 90 tablet 2  . budesonide (PULMICORT) 180 MCG/ACT inhaler Inhale 1 puff into the lungs 2 (two) times daily as needed (shortness of breath).    . fexofenadine (ALLEGRA) 180 MG tablet Take 180 mg by mouth daily.    . metoprolol tartrate (LOPRESSOR) 25 MG tablet Take 1 tablet (25 mg total) by mouth 2 (two) times daily. 180 tablet 2  . ticagrelor (BRILINTA) 90 MG TABS tablet Take 1 tablet (90 mg total) by mouth 2 (two) times daily. 180 tablet 2  . albuterol (PROVENTIL HFA;VENTOLIN HFA) 108 (90 BASE) MCG/ACT inhaler Inhale 2 puffs into the lungs every 6 (six) hours as needed for wheezing or shortness of breath.    . nitroGLYCERIN (NITROSTAT) 0.4 MG SL tablet Place 1 tablet (0.4 mg total) under the tongue every 5 (five) minutes x 3 doses as needed for chest pain. (Patient not taking: Reported on 10/19/2014) 25 tablet 3   No current facility-administered medications for this visit.    Allergies:   Review of patient's allergies indicates no known allergies.    Social History:  The patient  reports that he has never smoked. He  does not have any smokeless tobacco history on file. He reports that he drinks about 1.8 oz of alcohol per week.   Family History:  The patient's family history includes Heart attack in his father.    ROS:  Please see the history of present illness.   Otherwise, review of systems are positive for none.   All other systems are reviewed and negative.    PHYSICAL EXAM: VS:  BP 130/70 mmHg  Pulse 77  Ht 5\' 8"  (1.727 m)  Wt 171 lb (77.565 kg)  BMI 26.01 kg/m2 , BMI Body mass index is 26.01 kg/(m^2). GEN: Well nourished, well developed, in no acute distress HEENT: normal Neck: no JVD, carotid bruits, or masses Cardiac: RRR; no murmurs,  rubs, or gallops,no edema  Respiratory:  clear to auscultation bilaterally, normal work of breathing GI: soft, nontender, nondistended, + BS MS: no deformity or atrophy Skin: warm and dry, no rash Neuro:  Strength and sensation are intact Psych: euthymic mood, full affect   EKG:  EKG is not ordered today.   Recent Labs: 02/25/2014: TSH 3.220 02/26/2014: BUN 13; Creatinine 1.15; Hemoglobin 15.1; Platelets 171; Potassium 4.2; Sodium 138 09/05/2014: ALT 32    Lipid Panel    Component Value Date/Time   CHOL 120 09/05/2014 1029   TRIG 80.0 09/05/2014 1029   HDL 32.70* 09/05/2014 1029   CHOLHDL 4 09/05/2014 1029   VLDL 16.0 09/05/2014 1029   LDLCALC 71 09/05/2014 1029      Wt Readings from Last 3 Encounters:  10/19/14 171 lb (77.565 kg)  06/21/14 170 lb 1.9 oz (77.166 kg)  03/10/14 169 lb (76.658 kg)      Other studies Reviewed: Additional studies/ records that were reviewed today include: .   ASSESSMENT AND PLAN:  1.  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 This was done in June 2015. Chest discomfort was a central pressure with radiation to the neck bilaterally.. The discomfort that he had in January was a burning discomfort totally dissimilar. 2. Possible hives related to aspirin or one of his other medications. 3 episodes over the past 6 months. 3. Asthma, under control 4. Recent chest pain   Current medicines are reviewed at length with the patient today.  The patient has concerns regarding medicines.  The following changes have been made:  We are suspicious of aspirin may be causing hives. We may need to discontinue the medication. Because of the recent chest discomfort we will go ahead and order a stress Cardiolite to exclude the possibility of new ischemia. He is free to travel to [place] if he has no significant abnormalities on the Cardiolite other than an inferior wall defect from the prior known inferior  infarction.  Labs/ tests ordered today include: Stress Cardiolite   Orders Placed This Encounter  Procedures  . Myocardial Perfusion Imaging     Disposition:   FU with Linard Millers in 6 months   Signed, Sinclair Grooms, MD  10/19/2014 5:28 PM    [facility] [place], [place], [place]  [address] Phone: [phone]; Fax: [phone]

## 2014-10-19 NOTE — Patient Instructions (Signed)
Your physician recommends that you continue on your current medications as directed. Please refer to the Current Medication list given to you today.  Take Allegra/ or Zyrtec daily  Your physician has requested that you have en exercise stress myoview. For further information please visit [email or web address]. Please follow instruction sheet, as given.  When taking Nitro-glycerin DO NOT take while standing. Lay down if possible for at least  10 minutes  Your physician wants you to follow-up in: 6 months with Dr.Smith You will receive a reminder letter in the mail two months in advance. If you don't receive a letter, please call our office to schedule the follow-up appointment.

## 2014-10-20 ENCOUNTER — Ambulatory Visit (HOSPITAL_COMMUNITY): Payer: BC Managed Care – PPO | Attending: Cardiology | Admitting: Radiology

## 2014-10-20 DIAGNOSIS — R079 Chest pain, unspecified: Secondary | ICD-10-CM | POA: Diagnosis present

## 2014-10-20 DIAGNOSIS — I10 Essential (primary) hypertension: Secondary | ICD-10-CM | POA: Insufficient documentation

## 2014-10-20 DIAGNOSIS — R55 Syncope and collapse: Secondary | ICD-10-CM | POA: Insufficient documentation

## 2014-10-20 DIAGNOSIS — E785 Hyperlipidemia, unspecified: Secondary | ICD-10-CM | POA: Insufficient documentation

## 2014-10-20 DIAGNOSIS — I2510 Atherosclerotic heart disease of native coronary artery without angina pectoris: Secondary | ICD-10-CM

## 2014-10-20 MED ORDER — TECHNETIUM TC 99M SESTAMIBI GENERIC - CARDIOLITE
30.0000 | Freq: Once | INTRAVENOUS | Status: AC | PRN
  Administered 2014-10-20: 30 via INTRAVENOUS

## 2014-10-20 MED ORDER — TECHNETIUM TC 99M SESTAMIBI GENERIC - CARDIOLITE
10.0000 | Freq: Once | INTRAVENOUS | Status: AC | PRN
  Administered 2014-10-20: 10 via INTRAVENOUS

## 2014-10-20 NOTE — Progress Notes (Signed)
[facility] [place], [place] [address] [phone]    Cardiology Nuclear Med Study  Eric Cortez is a 64 y.o. male     MRN : [id number]     DOB: [birth year]  Procedure Date: 10/20/2014  Nuclear Med Background Indication for Stress Test:  Evaluation for Ischemia and Stent Patency History:  CAD Cardiac Risk Factors: Hypertension and Lipids  Symptoms:  Chest Pain and Syncope   Nuclear Pre-Procedure Caffeine/Decaff Intake:  None> 12 hrs NPO After: 5:00pm   Lungs:  clear O2 Sat: 96% on room air. IV 0.9% NS with Angio Cath:  20g  IV Site: R Antecubital x 1, tolerated well IV Started by:  Irven Baltimore, RN  Chest Size (in):  40 Cup Size: n/a  Height: 5\' 8"  (1.727 m)  Weight:  169 lb (76.658 kg)  BMI:  Body mass index is 25.7 kg/(m^2). Tech Comments:  Patient held Lopressor x 24 hrs. Irven Baltimore, RN.    Nuclear Med Study 1 or 2 day study: 1 day  Stress Test Type:  Stress  Reading MD: N/A  Order Authorizing Provider:  Daneen Schick, MD  Resting Radionuclide: Technetium 65m Sestamibi  Resting Radionuclide Dose: 11.0 mCi   Stress Radionuclide:  Technetium 57m Sestamibi  Stress Radionuclide Dose: 33.0 mCi           Stress Protocol Rest HR: 70 Stress HR: 162  Rest BP: 135/82 Stress BP: 145/84  Exercise Time (min): 5:46 METS: 7.0   Predicted Max HR: 156 bpm % Max HR: 103.85 bpm Rate Pressure Product: 23490   Dose of Adenosine (mg):  n/a Dose of Lexiscan: n/a mg  Dose of Atropine (mg): n/a Dose of Dobutamine: n/a mcg/kg/min (at max HR)  Stress Test Technologist: Crissie Figures, RN  Nuclear Technologist:  Earl Many, CNMT     Rest Procedure:  Myocardial perfusion imaging was performed at rest 45 minutes following the intravenous administration of Technetium 32m Sestamibi. Rest ECG: NSR - Normal EKG  Stress Procedure:  The patient exercised on the treadmill utilizing the Bruce Protocol for 5:46 minutes. The patient stopped due to  dyspnea and denied any chest pain.  Technetium 61m Sestamibi was injected at peak exercise and myocardial perfusion imaging was performed after a brief delay. Stress ECG: No significant change from baseline ECG  QPS Raw Data Images:  Mild diaphragmatic attenuation.  Normal left ventricular size. Stress Images:  There is decreased uptake in the inferior wall. Rest Images:  There is decreased uptake in the inferior wall. Subtraction (SDS):  There is a fixed inferior defect that is most consistent with diaphragmatic attenuation. Transient Ischemic Dilatation (Normal <1.22):  0.84 Lung/Heart Ratio (Normal <0.45):  0.31  Quantitative Gated Spect Images QGS EDV:  74 ml QGS ESV:  25 ml  Impression Exercise Capacity:  Fair exercise capacity. BP Response:  Normal blood pressure response. Clinical Symptoms:  There is dyspnea. ECG Impression:  No significant ST segment change suggestive of ischemia. Comparison with Prior Nuclear Study: No images to compare  Overall Impression:  Low risk stress nuclear study with a medium sized, moderate intensity, fixed defect in the inferior wall consistent with diaphragmatic attenuation.  No evidence of ischemia. .  LV Ejection Fraction: 67%.  LV Wall Motion:  NL LV Function; NL Wall Motion  Signed: Fransico Him, MD [facility] 10/20/2014

## 2014-10-25 ENCOUNTER — Telehealth: Payer: Self-pay

## 2014-10-25 NOTE — Telephone Encounter (Signed)
Pt aware of myoview results.Low risk myocardial perfusion study. Okay to travel by plane. No evidence decreased blood flow. Pt verbalized understanding.

## 2014-10-25 NOTE — Telephone Encounter (Signed)
-----   Message from Crosby, MD sent at 10/21/2014  9:30 PM EST ----- Low risk myocardial perfusion study. Okay to travel  by plane. No evidence decreased blood flow.

## 2014-12-31 ENCOUNTER — Telehealth: Payer: Self-pay | Admitting: Nurse Practitioner

## 2014-12-31 NOTE — Telephone Encounter (Signed)
   Ms. Allemand called this AM 2/2 her husband developing recurrent hives, now involving his face.  This has been happening intermittently since last June, when he underwent PCI.  He's been on asa, bb, statin, brilinta since.  This has prev been documented by Dr. Tamala Julian.  Ms. Heidinger gave Mr. Adkins benadryl and also a prednisone tablet that she had left over from a previous Rx for herself.  I recommended that he be seen @ either Urgent Care or in the ED for evaluation as he may require a prednisone taper.  We/Dr. Tamala Julian will need to see him in clinic this week, to consider switching from brilinta to plavix vs d/c'ing ASA, as it's not currently clear what the offending agent is (they have stopped lipitor previously w/o change in rash).    Caller verbalized understanding and was grateful for the call back.  Murray Hodgkins, NP 12/31/2014, 10:29 AM

## 2015-01-02 ENCOUNTER — Telehealth: Payer: Self-pay | Admitting: Interventional Cardiology

## 2015-01-02 NOTE — Telephone Encounter (Signed)
Returned pt call.lmtcb 

## 2015-01-02 NOTE — Telephone Encounter (Signed)
New message        Eric Cortez is having a breakout on his face   Eric Cortez took prednisone and benadryl per Eric Cortez wife   they are planning to go on a plane to [place] this month and would like to talk to dr Tamala Julian before they leave   please give Eric Cortez a call

## 2015-01-03 NOTE — Telephone Encounter (Signed)
Discontinue Brilinta. No need to start Plavix. Okay to fly

## 2015-01-03 NOTE — Telephone Encounter (Signed)
New Message    Patient is returning call , please call back.

## 2015-01-03 NOTE — Telephone Encounter (Signed)
Returned pt call. lmtcb if assistance is still needed 

## 2015-01-03 NOTE — Telephone Encounter (Signed)
F/u ° ° °Pt returning your call °

## 2015-01-03 NOTE — Telephone Encounter (Signed)
Message routed to Dr.Smith to call pt, to discuss concerns

## 2015-01-06 ENCOUNTER — Ambulatory Visit (INDEPENDENT_AMBULATORY_CARE_PROVIDER_SITE_OTHER): Payer: BC Managed Care – PPO | Admitting: Interventional Cardiology

## 2015-01-06 ENCOUNTER — Other Ambulatory Visit: Payer: Self-pay

## 2015-01-06 ENCOUNTER — Encounter: Payer: Self-pay | Admitting: Interventional Cardiology

## 2015-01-06 VITALS — BP 140/80 | HR 54 | Ht 69.0 in | Wt 171.8 lb

## 2015-01-06 DIAGNOSIS — I2119 ST elevation (STEMI) myocardial infarction involving other coronary artery of inferior wall: Secondary | ICD-10-CM

## 2015-01-06 DIAGNOSIS — L509 Urticaria, unspecified: Secondary | ICD-10-CM

## 2015-01-06 DIAGNOSIS — I2510 Atherosclerotic heart disease of native coronary artery without angina pectoris: Secondary | ICD-10-CM | POA: Diagnosis not present

## 2015-01-06 DIAGNOSIS — I255 Ischemic cardiomyopathy: Secondary | ICD-10-CM | POA: Diagnosis not present

## 2015-01-06 MED ORDER — PREDNISONE 10 MG PO TABS
ORAL_TABLET | ORAL | Status: DC
Start: 2015-01-06 — End: 2015-01-30

## 2015-01-06 MED ORDER — PREDNISONE 10 MG (21) PO TBPK: 10.0000 mg | ORAL_TABLET | Freq: Every day | ORAL | Status: DC

## 2015-01-06 NOTE — Telephone Encounter (Signed)
Pt has appt  With Dr.Smith on 5/5 @ 9:30am

## 2015-01-06 NOTE — Telephone Encounter (Signed)
Addressed at 5/6 o/v

## 2015-01-06 NOTE — Patient Instructions (Signed)
Medication Instructions:  Your physician has recommended you make the following change in your medication:  1) STOP Aspirin 2) CONTINUE Brilinta until the end of June 2016. THEN STOP  Labwork: None   Testing/Procedures: None   Follow-Up: Your physician wants you to follow-up in: 6 months with Dr.Smith You will receive a reminder letter in the mail two months in advance. If you don't receive a letter, please call our office to schedule the follow-up appointment.   Any Other Special Instructions Will Be Listed Below (If Applicable). Call the office in June with an update of symptoms

## 2015-01-06 NOTE — Progress Notes (Signed)
Cardiology Office Note   Date:  01/06/2015   ID:  [name], DOB [birth year], MRN [id number]  PCP:  Curlene Labrum, MD  Cardiologist:   Sinclair Grooms, MD   Chief Complaint  Patient presents with  . Cardiomyopathy    Ischemic   . Coronary Artery Disease      History of Present Illness: Eric Cortez is a 64 y.o. male who presents for CAD, drug-eluting stent and anomalous RCA approximately one year ago, and recent development of 5's intermittently starting 2 months after beginning dual antiplatelet therapy.  There are no cardiac complaints.  Wife states that he breaks out in hives in different spots several times a month that requires prednisone and Benadryl. He has had some hives near his mouth. No difficulty breathing.    Past Medical History  Diagnosis Date  . Asthma   . CAD (coronary artery disease)     a. 01/2014 Inf STEMI/PCI: LM nl, LAD 50p/m, LCX 30-1m, OM1 50, OM2/3/4 nl, RCA (anomalous) 100p (3.5x23 Xience Alpine DES), EF 60;   . HTN (hypertension)   . Ischemic cardiomyopathy     ab. 01/2014 Echo: EF 45-50%, inferoseptal/inf sev HK, Gr 1 DD, nl valves.    Past Surgical History  Procedure Laterality Date  . Coronary stent placement  02/25/14  . Left heart catheterization with coronary angiogram N/A 02/25/2014    Procedure: LEFT HEART CATHETERIZATION WITH CORONARY ANGIOGRAM;  Surgeon: Sinclair Grooms, MD;  Location: [facility] CATH LAB;  Service: Cardiovascular;  Laterality: N/A;  . Percutaneous coronary stent intervention (pci-s)  02/25/2014    Procedure: PERCUTANEOUS CORONARY STENT INTERVENTION (PCI-S);  Surgeon: Sinclair Grooms, MD;  Location: [facility] CATH LAB;  Service: Cardiovascular;;     Current Outpatient Prescriptions  Medication Sig Dispense Refill  . albuterol (PROVENTIL HFA;VENTOLIN HFA) 108 (90 BASE) MCG/ACT inhaler Inhale 2 puffs into the lungs every 6 (six) hours as needed for wheezing or shortness of breath.    Marland Kitchen atorvastatin (LIPITOR) 40 MG  tablet Take 1 tablet (40 mg total) by mouth daily at 6 PM. 90 tablet 2  . budesonide (PULMICORT) 180 MCG/ACT inhaler Inhale 1 puff into the lungs 2 (two) times daily as needed (shortness of breath).    . diphenhydrAMINE (SOMINEX) 25 MG tablet Take 50 mg by mouth 2 (two) times daily.    . fexofenadine (ALLEGRA) 180 MG tablet Take 180 mg by mouth daily.    . metoprolol tartrate (LOPRESSOR) 25 MG tablet Take 1 tablet (25 mg total) by mouth 2 (two) times daily. 180 tablet 2  . nitroGLYCERIN (NITROSTAT) 0.4 MG SL tablet Place 1 tablet (0.4 mg total) under the tongue every 5 (five) minutes x 3 doses as needed for chest pain. 25 tablet 3  . ticagrelor (BRILINTA) 90 MG TABS tablet Take 1 tablet (90 mg total) by mouth 2 (two) times daily. 180 tablet 2   No current facility-administered medications for this visit.    Allergies:   Review of patient's allergies indicates no known allergies.    Social History:  The patient  reports that he has never smoked. He does not have any smokeless tobacco history on file. He reports that he drinks about 1.8 oz of alcohol per week.   Family History:  The patient's family history includes Colon cancer in his mother; Diabetes in his father; Diverticulosis in his sister; Heart attack in his father; Heart disease in his father and maternal aunt; Hypertension in his father.  ROS:  Please see the history of present illness.   Otherwise, review of systems are positive for none.   All other systems are reviewed and negative.    PHYSICAL EXAM: VS:  BP 140/80 mmHg  Pulse 54  Ht 5\' 9"  (1.753 m)  Wt 171 lb 12.8 oz (77.928 kg)  BMI 25.36 kg/m2 , BMI Body mass index is 25.36 kg/(m^2). GEN: Well nourished, well developed, in no acute distress HEENT: normal Neck: no JVD, carotid bruits, or masses Cardiac:  RRR; no murmurs, rubs, or gallops,no edema  Respiratory:  clear to auscultation bilaterally, normal work of breathing GI: soft, nontender, nondistended, + BS MS: no  deformity or atrophy Skin: warm and dry, no rash Neuro:  Strength and sensation are intact Psych: euthymic mood, full affect   EKG:  EKG is ordered today. The ekg ordered today demonstrates inferior infarct unchanged from prior. Sinus bradycardia.   Recent Labs: 02/25/2014: TSH 3.220 02/26/2014: BUN 13; Creatinine 1.15; Hemoglobin 15.1; Platelets 171; Potassium 4.2; Sodium 138 09/05/2014: ALT 32    Lipid Panel    Component Value Date/Time   CHOL 120 09/05/2014 1029   TRIG 80.0 09/05/2014 1029   HDL 32.70* 09/05/2014 1029   CHOLHDL 4 09/05/2014 1029   VLDL 16.0 09/05/2014 1029   LDLCALC 71 09/05/2014 1029      Wt Readings from Last 3 Encounters:  01/06/15 171 lb 12.8 oz (77.928 kg)  10/20/14 169 lb (76.658 kg)  10/19/14 171 lb (77.565 kg)      Other studies Reviewed: Additional studies/ records that were reviewed today include: .    ASSESSMENT AND PLAN:  Urticaria: : Possibly aspirin related since the patient has asthma.  Ischemic cardiomyopathy - no heart failure symptoms.  Coronary artery disease involving native coronary artery of native heart without angina pectoris - asymptomatic  Old inferior wall, initial episode of care     Current medicines are reviewed at length with the patient today.  The patient has concerns regarding medicines.  The following changes have been made:  Discontinue aspirin. Continue Brilinta until the end of June. If have suffered dissipated we will assume that aspirin was a culprit. At that point we will stop Brilinta and start Plavix 75 mg per day as the long-term antiplatelet of choice. If hives are continuing to occur off aspirin, we will resume aspirin and discontinue Brilinta hoping that Brilinta as the source of hives.  Labs/ tests ordered today include:  Orders Placed This Encounter  Procedures  . EKG 12-Lead     Disposition:   FU with HS in 6 months  Signed, Sinclair Grooms, MD  01/06/2015 10:10 AM    [facility] [place], [place], [place]  [address] Phone: [phone]; Fax: [phone]

## 2015-01-17 ENCOUNTER — Telehealth: Payer: Self-pay | Admitting: Interventional Cardiology

## 2015-01-17 MED ORDER — ASPIRIN EC 81 MG PO TBEC: 81.0000 mg | DELAYED_RELEASE_TABLET | Freq: Every day | ORAL | Status: DC

## 2015-01-17 NOTE — Telephone Encounter (Signed)
Pt wife aware of Dr.Smith's recommendation. STOP Brilinta Resume a coated Aspirin 81mg  daily. Pt wife verbalized understanding.

## 2015-01-17 NOTE — Telephone Encounter (Signed)
Follow Up  Pt wife called states that the pt has broken out in hives. Ruling out which medication it is. Pt was taken off of the Asprin its not the Asprin believes it is the [facility]. She (pt's wife)  is not sure if it is the [place], req a call back to determine if it is ok to come off of it. Please call

## 2015-01-30 ENCOUNTER — Encounter (HOSPITAL_COMMUNITY): Payer: Self-pay | Admitting: Emergency Medicine

## 2015-01-30 ENCOUNTER — Emergency Department (HOSPITAL_COMMUNITY)
Admission: EM | Admit: 2015-01-30 | Discharge: 2015-01-30 | Disposition: A | Discharge status: Home / Self Care | Payer: BC Managed Care – PPO | Attending: Emergency Medicine | Admitting: Emergency Medicine

## 2015-01-30 DIAGNOSIS — Y998 Other external cause status: Secondary | ICD-10-CM | POA: Insufficient documentation

## 2015-01-30 DIAGNOSIS — Y9289 Other specified places as the place of occurrence of the external cause: Secondary | ICD-10-CM | POA: Insufficient documentation

## 2015-01-30 DIAGNOSIS — Z79899 Other long term (current) drug therapy: Secondary | ICD-10-CM | POA: Insufficient documentation

## 2015-01-30 DIAGNOSIS — Y9389 Activity, other specified: Secondary | ICD-10-CM | POA: Diagnosis not present

## 2015-01-30 DIAGNOSIS — I2510 Atherosclerotic heart disease of native coronary artery without angina pectoris: Secondary | ICD-10-CM | POA: Insufficient documentation

## 2015-01-30 DIAGNOSIS — Z7951 Long term (current) use of inhaled steroids: Secondary | ICD-10-CM | POA: Insufficient documentation

## 2015-01-30 DIAGNOSIS — X58XXXA Exposure to other specified factors, initial encounter: Secondary | ICD-10-CM | POA: Insufficient documentation

## 2015-01-30 DIAGNOSIS — T783XXA Angioneurotic edema, initial encounter: Secondary | ICD-10-CM | POA: Diagnosis not present

## 2015-01-30 DIAGNOSIS — J45909 Unspecified asthma, uncomplicated: Secondary | ICD-10-CM | POA: Diagnosis not present

## 2015-01-30 DIAGNOSIS — Z8673 Personal history of transient ischemic attack (TIA), and cerebral infarction without residual deficits: Secondary | ICD-10-CM | POA: Diagnosis not present

## 2015-01-30 DIAGNOSIS — I10 Essential (primary) hypertension: Secondary | ICD-10-CM | POA: Diagnosis not present

## 2015-01-30 DIAGNOSIS — Z9889 Other specified postprocedural states: Secondary | ICD-10-CM | POA: Insufficient documentation

## 2015-01-30 DIAGNOSIS — Z7982 Long term (current) use of aspirin: Secondary | ICD-10-CM | POA: Diagnosis not present

## 2015-01-30 DIAGNOSIS — R220 Localized swelling, mass and lump, head: Secondary | ICD-10-CM | POA: Diagnosis present

## 2015-01-30 MED ORDER — METHYLPREDNISOLONE SODIUM SUCC 125 MG IJ SOLR: 125.0000 mg | Freq: Once | INTRAMUSCULAR | Status: DC

## 2015-01-30 MED ORDER — PREDNISONE 50 MG PO TABS: 50.0000 mg | ORAL_TABLET | Freq: Every day | ORAL | Status: DC

## 2015-01-30 MED ORDER — FAMOTIDINE 20 MG PO TABS: 20.0000 mg | ORAL_TABLET | Freq: Every day | ORAL | Status: DC

## 2015-01-30 MED ORDER — METHYLPREDNISOLONE SODIUM SUCC 125 MG IJ SOLR
125.0000 mg | Freq: Once | INTRAMUSCULAR | Status: AC
  Administered 2015-01-30: 125 mg via INTRAVENOUS
  Filled 2015-01-30: qty 2

## 2015-01-30 MED ORDER — FAMOTIDINE IN NACL 20-0.9 MG/50ML-% IV SOLN
20.0000 mg | Freq: Once | INTRAVENOUS | Status: AC
  Administered 2015-01-30: 20 mg via INTRAVENOUS
  Filled 2015-01-30: qty 50

## 2015-01-30 NOTE — ED Provider Notes (Signed)
CSN: [id number]     Arrival date & time 01/30/15  0149 History   First MD Initiated Contact with Patient 01/30/15 0222     This chart was scribed for Eric Rice, MD by Forrestine Him, ED Scribe. This patient was seen in room B19C/B19C and the patient's care was started 2:43 AM.   Chief Complaint  Patient presents with  . Angioedema   The history is provided by the patient. No language interpreter was used.    HPI Comments: Eric Cortez is a 64 y.o. male with a PMHx of CAD and HTN who presents to the Emergency Department here for angioedema x 3 hours. Pt states he woke this evening with swelling to his tongue and L side of his face. 75 mg of Benadryl and 10 mg of Prednisone given prior to arrival. Pt also reports noticeable hives to his abdomen and chest. No new foods. No new medications. [name] admits symptoms have been intermittent and ongoing since July 2015 after a coronary stent placement. No history of intubation secondary to angioedema. He is on prescribed Metoprolol and ASA daily. Pt is due to have allergy testing in 2 days.  Past Medical History  Diagnosis Date  . Asthma   . CAD (coronary artery disease)     a. 01/2014 Inf STEMI/PCI: LM nl, LAD 50p/m, LCX 30-1m, OM1 50, OM2/3/4 nl, RCA (anomalous) 100p (3.5x23 Xience Alpine DES), EF 60;   . HTN (hypertension)   . Ischemic cardiomyopathy     ab. 01/2014 Echo: EF 45-50%, inferoseptal/inf sev HK, Gr 1 DD, nl valves.   Past Surgical History  Procedure Laterality Date  . Coronary stent placement  02/25/14  . Left heart catheterization with coronary angiogram N/A 02/25/2014    Procedure: LEFT HEART CATHETERIZATION WITH CORONARY ANGIOGRAM;  Surgeon: Sinclair Grooms, MD;  Location: [facility] CATH LAB;  Service: Cardiovascular;  Laterality: N/A;  . Percutaneous coronary stent intervention (pci-s)  02/25/2014    Procedure: PERCUTANEOUS CORONARY STENT INTERVENTION (PCI-S);  Surgeon: Sinclair Grooms, MD;  Location: [facility] CATH LAB;  Service:  Cardiovascular;;   Family History  Problem Relation Age of Onset  . Heart attack Father   . Hypertension Father   . Heart disease Father   . Diabetes Father   . Colon cancer Mother   . Diverticulosis Sister   . Heart disease Maternal Aunt    History  Substance Use Topics  . Smoking status: Never Smoker   . Smokeless tobacco: Not on file  . Alcohol Use: 1.8 oz/week    3 Glasses of wine per week     Comment: occassional ETOH    Review of Systems  Constitutional: Negative for fever and chills.  HENT: Positive for facial swelling. Negative for sore throat, trouble swallowing and voice change.        Swelling to tongue and L side of face  Eyes: Negative for visual disturbance.  Respiratory: Negative for cough, shortness of breath, wheezing and stridor.   Cardiovascular: Negative for chest pain, palpitations and leg swelling.  Gastrointestinal: Negative for nausea, vomiting and abdominal pain.  Musculoskeletal: Negative for arthralgias, neck pain and neck stiffness.  Skin: Positive for rash.  Neurological: Negative for dizziness, weakness, Sperling-headedness, numbness and headaches.  Psychiatric/Behavioral: Negative for confusion.  All other systems reviewed and are negative.     Allergies  Review of patient's allergies indicates no known allergies.  Home Medications   Prior to Admission medications   Medication Sig Start Date  End Date Taking? Authorizing Provider  albuterol (PROVENTIL HFA;VENTOLIN HFA) 108 (90 BASE) MCG/ACT inhaler Inhale 2 puffs into the lungs every 6 (six) hours as needed for wheezing or shortness of breath.   Yes Historical Provider, MD  aspirin EC 81 MG tablet Take 1 tablet (81 mg total) by mouth daily. 01/17/15  Yes Belva Crome, MD  budesonide (PULMICORT) 180 MCG/ACT inhaler Inhale 1 puff into the lungs 2 (two) times daily as needed (shortness of breath).   Yes Historical Provider, MD  diphenhydrAMINE (SOMINEX) 25 MG tablet Take 50 mg by mouth 2 (two)  times daily as needed for allergies.    Yes Historical Provider, MD  fexofenadine (ALLEGRA) 180 MG tablet Take 180 mg by mouth daily as needed for allergies.    Yes Historical Provider, MD  metoprolol tartrate (LOPRESSOR) 25 MG tablet Take 1 tablet (25 mg total) by mouth 2 (two) times daily. 09/07/14  Yes Belva Crome, MD  nitroGLYCERIN (NITROSTAT) 0.4 MG SL tablet Place 1 tablet (0.4 mg total) under the tongue every 5 (five) minutes x 3 doses as needed for chest pain. 03/10/14  Yes Belva Crome, MD  atorvastatin (LIPITOR) 40 MG tablet Take 1 tablet (40 mg total) by mouth daily at 6 PM. Patient not taking: Reported on 01/30/2015 09/07/14   Belva Crome, MD  predniSONE (DELTASONE) 50 MG tablet Take 1 tablet (50 mg total) by mouth daily. Take 1 tablet Twice daily for 3 days then stop. Repeat if neccasary 01/30/15   Eric Rice, MD   Triage Vitals: BP 111/70 mmHg  Pulse 54  Temp(Src) 98 F (36.7 C) (Oral)  Resp 18  SpO2 92%   Physical Exam  Constitutional: He is oriented to person, place, and time. He appears well-developed and well-nourished. No distress.  HENT:  Head: Normocephalic and atraumatic.  Mouth/Throat: Oropharynx is clear and moist.  Left upper lip swelling and mild left-sided tongue swelling. Patient speaking clearly. No respiratory distress.  Eyes: EOM are normal. Pupils are equal, round, and reactive to [place].  Neck: Normal range of motion. Neck supple.  Cardiovascular: Normal rate and regular rhythm.   Pulmonary/Chest: Effort normal and breath sounds normal. No stridor. No respiratory distress. He has no wheezes. He has no rales.  Abdominal: Soft. Bowel sounds are normal.  Musculoskeletal: Normal range of motion. He exhibits no edema or tenderness.  Neurological: He is alert and oriented to person, place, and time.  Moves all extremities without deficit. Sensation is fully intact.  Skin: Skin is warm and dry. Rash noted. No erythema.  Erythematous raised plaques to the upper  abdomen bilaterally  Psychiatric: He has a normal mood and affect. His behavior is normal.  Nursing note and vitals reviewed.   ED Course  Procedures (including critical care time)  DIAGNOSTIC STUDIES: Oxygen Saturation is 94% on RA, adequate by my interpretation.    COORDINATION OF CARE: 2:43 AM- Will give Pepcid and Solu-medrol. Discussed treatment plan with pt at bedside and pt agreed to plan.     Labs Review Labs Reviewed - No data to display  Imaging Review No results found.   EKG Interpretation None      MDM   Final diagnoses:  Angioedema, initial encounter   After steroids and Pepcid patient states his symptoms have improved. There is no significant decrease of the swelling of the tongue. He continues to have no respiratory distress. Anticipate discharge home to follow-up with the allergist.  Agent with continued improvement. Return precautions  given.  Eric Rice, MD 01/30/15 [phone]

## 2015-01-30 NOTE — Discharge Instructions (Signed)
Angioedema °Angioedema is a sudden swelling of tissues, often of the skin. It can occur on the face or genitals or in the abdomen or other body parts. The swelling usually develops over a short period and gets better in 24 to 48 hours. It often begins during the night and is found when the person wakes up. The person may also get red, itchy patches of skin (hives). Angioedema can be dangerous if it involves swelling of the air passages.  °Depending on the cause, episodes of angioedema may only happen once, come back in unpredictable patterns, or repeat for several years and then gradually fade away.  °CAUSES  °Angioedema can be caused by an allergic reaction to various triggers. It can also result from nonallergic causes, including reactions to drugs, immune system disorders, viral infections, or an abnormal gene that is passed to you from your parents (hereditary). For some people with angioedema, the cause is unknown.  °Some things that can trigger angioedema include:  °· Foods.   °· Medicines, such as ACE inhibitors, ARBs, nonsteroidal anti-inflammatory agents, or estrogen.   °· Latex.   °· Animal saliva.   °· Insect stings.   °· Dyes used in X-rays.   °· Mild injury.   °· Dental work. °· Surgery. °· Stress.   °· Sudden changes in temperature.   °· Exercise. °SIGNS AND SYMPTOMS  °· Swelling of the skin. °· Hives. If these are present, there is also intense itching. °· Redness in the affected area.   °· Pain in the affected area. °· Swollen lips or tongue. °· Breathing problems. This may happen if the air passages swell. °· Wheezing. °If internal organs are involved, there may be:  °· Nausea.   °· Abdominal pain.   °· Vomiting.   °· Difficulty swallowing.   °· Difficulty passing urine. °DIAGNOSIS  °· Your health care provider will examine the affected area and take a medical and family history. °· Various tests may be done to help determine the cause. Tests may include: °¨ Allergy skin tests to see if the problem  is an allergic reaction.   °¨ Blood tests to check for hereditary angioedema.   °¨ Tests to check for underlying diseases that could cause the condition.   °· A review of your medicines, including over-the-counter medicines, may be done. °TREATMENT  °Treatment will depend on the cause of the angioedema. Possible treatments include:  °· Removal of anything that triggered the condition (such as stopping certain medicines).   °· Medicines to treat symptoms or prevent attacks. Medicines given may include:   °¨ Antihistamines.   °¨ Epinephrine injection.   °¨ Steroids.   °· Hospitalization may be required for severe attacks. If the air passages are affected, it can be an emergency. Tubes may need to be placed to keep the airway open. °HOME CARE INSTRUCTIONS  °· Take all medicines as directed by your health care provider. °· If you were given medicines for emergency allergy treatment, always carry them with you. °· Wear a medical bracelet as directed by your health care provider.   °· Avoid known triggers. °SEEK MEDICAL CARE IF:  °· You have repeat attacks of angioedema.   °· Your attacks are more frequent or more severe despite preventive measures.   °· You have hereditary angioedema and are considering having children. It is important to discuss with your health care provider the risks of passing the condition on to your children. °SEEK IMMEDIATE MEDICAL CARE IF:  °· You have severe swelling of the mouth, tongue, or lips. °· You have difficulty breathing.   °· You have difficulty swallowing.   °· You faint. °MAKE   SURE YOU: °· Understand these instructions. °· Will watch your condition. °· Will get help right away if you are not doing well or get worse. °Document Released: 10/28/2001 Document Revised: 01/03/2014 Document Reviewed: 04/12/2013 °Exit[employer]® Patient Information ©2015 [employer]. This information is not intended to replace advice given to you by your health care provider. Make sure you discuss any questions  you have with your health care provider. ° °

## 2015-01-30 NOTE — ED Notes (Signed)
Tongue swelling has decreased  Less  Swelling of lip and lt face

## 2015-01-30 NOTE — ED Notes (Signed)
The pt is comfortable 

## 2015-01-30 NOTE — ED Notes (Signed)
Pt up tio the br

## 2015-01-30 NOTE — ED Notes (Signed)
Pt woke up approx 00 with his tongue  Swelling. And hives  On his chest.    He has had similar episodes since July 2015.  Tonight was the first time his tongue was swollen. His tongue swelled more on the way here. Moire lt sided than right.  No wheezes,  No respo distress at present

## 2015-01-30 NOTE — ED Notes (Signed)
Pt. presents with left lower facial swelling , tongue swelling and hives at bilateral chest onset this evening , airway intact / respirations unlabored . Pt. took Benadryl 75 mg and Prednisone 10 mg at home at 12 30 midnight . Denies fever or chills.

## 2015-02-01 ENCOUNTER — Telehealth: Payer: Self-pay | Admitting: Interventional Cardiology

## 2015-02-01 NOTE — Telephone Encounter (Signed)
Returned pt wife call. Pt wife reports that pt was seen by an allergist yesterday and has been started on new medications. She is tearful on the phone, and sts that pt has had another rash break out all over his stomach this morning. She is concerned that the pt is allergic to the stent that placed placed after his MI. She is requesting pt be seen by Dr.Smith only " she sts that he is the only one she trust" Adv her Dr.Smith's schedule is full, I will work on getting pt an appt with an extender tomorrow while Dr.Smith is in the office. Adv her I will call her back with an appt time. She was appreciative and agreeable

## 2015-02-01 NOTE — Telephone Encounter (Signed)
Pt wife aware of pt appt with Sandria Senter on 4/2 @2pm 

## 2015-02-01 NOTE — Telephone Encounter (Signed)
New Message   Patients wife is calling to speak with nurse/doctor about the patients hives. Please give patient's wife a call back.

## 2015-02-02 ENCOUNTER — Ambulatory Visit (INDEPENDENT_AMBULATORY_CARE_PROVIDER_SITE_OTHER): Payer: BC Managed Care – PPO | Admitting: Cardiology

## 2015-02-02 ENCOUNTER — Encounter: Payer: Self-pay | Admitting: Cardiology

## 2015-02-02 VITALS — BP 120/72 | HR 68 | Ht 69.0 in | Wt 171.6 lb

## 2015-02-02 DIAGNOSIS — I2510 Atherosclerotic heart disease of native coronary artery without angina pectoris: Secondary | ICD-10-CM | POA: Diagnosis not present

## 2015-02-02 DIAGNOSIS — Z955 Presence of coronary angioplasty implant and graft: Secondary | ICD-10-CM

## 2015-02-02 DIAGNOSIS — L509 Urticaria, unspecified: Secondary | ICD-10-CM | POA: Diagnosis not present

## 2015-02-02 NOTE — Progress Notes (Signed)
Cardiology Office Note   Date:  02/02/2015   ID:  Eric Cortez, DOB Jul 21, 1951, MRN [id number]  PCP:  Curlene Labrum, MD  Cardiologist:  Dr. Daneen Schick  RF visit:  Hives, angioedema    History of Present Illness: Eric Cortez is a 64 y.o. male who presents for complaints of recurrent rash/hives. The rash began in 04/2014 with a red, raised, itchy, hot area as "big as a dinner plate". The lesions last about 3 days and when one resolves, another pops up at a different location. The patient had had an MI with DES placement to RCA. Initially, aspirin was thought to be causing the rash, but 10 days without aspirin did not alleviate the symptoms so the aspirin was resumed. On 01/18/15 the Brilinta was stopped with no change. On 01/30/15 the patient developed angioedema with swelling of the face, lips, tongue and throat. He took 75 mg benadryl and prednisone 10 mg on the way to the ED and the progression of symptoms halted. He has no difficulty breathing. On 5/31 the metoprolol was stopped as a precaution.  6/1 the patient saw an allergist who performed bloodwork and started the patient on pepcid, singulair and Zyrtec. His first day taking these was yesterday.  He did develop a whelt yesterday which resolved after taking 50 mg prednisone that he had leftover. The allergist instructed him to hold off on the prednisone and provided an epipen. The patient is to follow up tomorrow with the allergist to review labs and in 2 weeks for possible scratch testing.  The patient and his wife are concerned that he may be having an allergic reaction to the stent. He denies any cardiac symptoms. They are reassured that symptoms of stent rejection are more severe and would lead to cardiac symptoms. They feel a little better about that, but are frustrated with no clear etiology of the rash. The patient's wife indicates a great deal of stress in their lives and wonders if this could be causing the hives.   Past  Medical History  Diagnosis Date  . Asthma   . CAD (coronary artery disease)     a. 01/2014 Inf STEMI/PCI: LM nl, LAD 50p/m, LCX 30-109m, OM1 50, OM2/3/4 nl, RCA (anomalous) 100p (3.5x23 Xience Alpine DES), EF 60;   . HTN (hypertension)   . Ischemic cardiomyopathy     ab. 01/2014 Echo: EF 45-50%, inferoseptal/inf sev HK, Gr 1 DD, nl valves.    Past Surgical History  Procedure Laterality Date  . Coronary stent placement  02/25/14  . Left heart catheterization with coronary angiogram N/A 02/25/2014    Procedure: LEFT HEART CATHETERIZATION WITH CORONARY ANGIOGRAM;  Surgeon: Sinclair Grooms, MD;  Location: [facility] CATH LAB;  Service: Cardiovascular;  Laterality: N/A;  . Percutaneous coronary stent intervention (pci-s)  02/25/2014    Procedure: PERCUTANEOUS CORONARY STENT INTERVENTION (PCI-S);  Surgeon: Sinclair Grooms, MD;  Location: [facility] CATH LAB;  Service: Cardiovascular;;     Current Outpatient Prescriptions  Medication Sig Dispense Refill  . albuterol (PROVENTIL HFA;VENTOLIN HFA) 108 (90 BASE) MCG/ACT inhaler Inhale 2 puffs into the lungs every 6 (six) hours as needed for wheezing or shortness of breath.    Marland Kitchen aspirin EC 81 MG tablet Take 1 tablet (81 mg total) by mouth daily.    . budesonide (PULMICORT) 180 MCG/ACT inhaler Inhale 1 puff into the lungs 2 (two) times daily as needed (shortness of breath).    . diphenhydrAMINE (SOMINEX) 25 MG  tablet Take 50 mg by mouth 2 (two) times daily as needed for allergies.     . fexofenadine (ALLEGRA) 180 MG tablet Take 180 mg by mouth daily as needed for allergies.     . nitroGLYCERIN (NITROSTAT) 0.4 MG SL tablet Place 1 tablet (0.4 mg total) under the tongue every 5 (five) minutes x 3 doses as needed for chest pain. 25 tablet 3  . predniSONE (DELTASONE) 50 MG tablet Take 1 tablet (50 mg total) by mouth daily. Take 1 tablet Twice daily for 3 days then stop. Repeat if neccasary 5 tablet 0  . atorvastatin (LIPITOR) 40 MG tablet Take 1 tablet (40 mg total) by  mouth daily at 6 PM. (Patient not taking: Reported on 01/30/2015) 90 tablet 2  . metoprolol tartrate (LOPRESSOR) 25 MG tablet Take 1 tablet (25 mg total) by mouth 2 (two) times daily. (Patient not taking: Reported on 02/02/2015) 180 tablet 2   No current facility-administered medications for this visit.    Allergies:   Review of patient's allergies indicates no known allergies.    Social History:  The patient  reports that he has never smoked. He does not have any smokeless tobacco history on file. He reports that he drinks about 1.8 oz of alcohol per week.   Family History:  The patient's family history includes Colon cancer in his mother; Diabetes in his father; Diverticulosis in his sister; Heart attack in his father; Heart disease in his father and maternal aunt; Hypertension in his father.    ROS:  Please see the history of present illness.   All other systems are reviewed and negative. No chest pain   PHYSICAL EXAM: VS:  BP 120/72 mmHg  Pulse 68  Ht 5\' 9"  (1.753 m)  Wt 171 lb 9.6 oz (77.837 kg)  BMI 25.33 kg/m2  SpO2 97% , BMI Body mass index is 25.33 kg/(m^2). GEN: Well nourished, well developed, in no acute distress HEENT: normal Neck: no JVD, carotid bruits, or masses Cardiac: RRR; no murmurs, rubs, or gallops,no edema  Respiratory:  clear to auscultation bilaterally, normal work of breathing GI: soft, nontender, nondistended, + BS MS: no deformity or atrophy Skin: warm and dry, no rash at present Neuro:  Strength and sensation are intact Psych: euthymic mood, full affect   EKG:  EKG not ordered today.  Recent Labs: 02/25/2014: TSH 3.220 02/26/2014: BUN 13; Creatinine 1.15; Hemoglobin 15.1; Platelets 171; Potassium 4.2; Sodium 138 09/05/2014: ALT 32    Lipid Panel    Component Value Date/Time   CHOL 120 09/05/2014 1029   TRIG 80.0 09/05/2014 1029   HDL 32.70* 09/05/2014 1029   CHOLHDL 4 09/05/2014 1029   VLDL 16.0 09/05/2014 1029   LDLCALC 71 09/05/2014 1029       Wt Readings from Last 3 Encounters:  02/02/15 171 lb 9.6 oz (77.837 kg)  01/06/15 171 lb 12.8 oz (77.928 kg)  10/20/14 169 lb (76.658 kg)      Other studies Reviewed: Additional studies/ records that were reviewed today include: ED notes, previous office notes   ASSESSMENT AND PLAN:  1.  Urticaria. Pt is being worked up by Horticulturist, commercial. He should follow the allergist's recommendations and follow up here to update status in 1 month.  2. Coronary artery disease with prior acute infarct and DES to RCA (June 2015). No cardiac symptoms to indicate stent occlusion. Pt on aspirin, continue to hold Brillinta. BP and heart rate are stable off metoprolol. Keep metoprolol on hold for now as  urticaria being investigated. Reviewed with Dr. Tamala Julian.  3. Possible stress reaction related to home situation. If allergy workup negative, consider adding an SSRI.    Current medicines are reviewed with the patient today.   The following changes have been made:  See above   Disposition:   FU with in 1 month with APP, and Dr. Tamala Julian present in the office.  Lennie Muckle, NP  02/02/2015 4:27 PM    [facility] [address], [place], [place]  [address] Phone: [phone]; Fax: [phone]   Student-NP- Pecolia Ades saw pt and I followed with my independent exam and we discussed with Dr. Tamala Julian.  Above recommendations per Dr. Tamala Julian.

## 2015-02-02 NOTE — Patient Instructions (Signed)
Medication Instructions:   Your physician recommends that you continue on your current medications as directed. Please refer to the Current Medication list given to you today.   Labwork:   Testing/Procedures:   Follow-Up:   6 TO 8 WEEKS WITH APP ON DAY WITH DR Tamala Julian IN OFFICE IF POSSIBLE IF NOT JUST AN AVAILBLE APPT IN 6 TO 8 WEEK S  Any Other Special Instructions Will Be Listed Below (If Applicable).

## 2015-03-29 NOTE — Progress Notes (Signed)
Cardiology Office Note   Date:  03/30/2015   ID:  Eric Cortez, DOB 1951/02/25, MRN [id number]  PCP:  Curlene Labrum, MD  Cardiologist:  Dr. Daneen Schick     Chief Complaint  Patient presents with  . Coronary Artery Disease  . Follow-up    Urticaria     History of Present Illness: Eric Cortez is a 64 y.o. male with a hx of CAD, ischemic cardiomyopathy, HL, asthma. Patient was admitted in 01/2014 with inferior STEMI. Emergent cardiac catheterization demonstrated an occluded anomalous RCA and nonobstructive disease elsewhere. The RCA was treated with Xience Alpine DES. Echocardiogram demonstrated an EF of 45-50%. Myoview in 10/2014 demonstrated no ischemia, EF 67%.  He has recently been seen in follow-up by Dr. Tamala Julian secondary to urticaria of unknown source. Adjustments were made in his antiplatelet regimen to see if this would improve his symptoms. He was last seen by Cecilie Kicks, NP 02/02/15. She noted that the patient had stopped aspirin without resolution of urticaria. He had also stopped Brilinta without resolution. He also noted an episode of angioedema. He had been seen by an allergist. Testing was ongoing. Metoprolol was placed on hold.  Returns for follow-up.   He is here with his wife.  His urticaria has improved.  He is seeing Dr. Neldon Mc.  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 This is common after multiple tick bites.  He is currently on Prednisone, Allegra and is avoiding mammalian meats. He has not had any further urticaria since starting this regimen. He goes back next month for official testing.  The patient denies chest pain, shortness of breath, syncope, orthopnea, PND or significant pedal edema.    Studies/Reports Reviewed Today:  Myoview 10/2014 Low risk stress nuclear study with a medium sized, moderate intensity, fixed defect in the inferior wall consistent with diaphragmatic  attenuation. No evidence of ischemia.  LV Ejection Fraction: 67%. LV Wall Motion: NL LV Function; NL Wall Motion  Echo 02/25/14 - EF 45% to 50%. Inferoseptal and inferior severe hypokinesis. Grade 1 diastolic dysfunction). - Aortic valve: There was no stenosis. - Mitral valve: There was no significant regurgitation. - Right ventricle: The cavity size was normal. Systolic functionwas normal. - Pulmonary arteries: No complete TR doppler jet so unable toestimate PA systolic pressure. Impressions:  - Normal LV size with mildly decreased systolic function, EF 11-94%. Severe hypokinesis of the inferoseptal and inferior walls. Normal RV size and systoilc function. No significant valvular abnormalities.  LHC 02/25/14 LM:  normal . LAD: prox and mid 50% LCx:  OM1 ostial/proximal eccentric 50%, distal LCx 30-40% narrowing.  RCA:    totally occluded in the proximal to mid segment.  EF 60% PCI:  DES to prox to mid RCA IMPRESSIONS: 1. Inferior ST elevation MI secondary to acute RCA occlusion. 2. 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 Successful drug-eluting stent implantation in the proximal to mid segment of the RCA using DES and reduction of total occlusion to 0% with TIMI grade 3 flow. The very distal quarter of the PDA was totally occluded at completion of the case related to embolization from the angioplasty site. 4. Widely patent left coronary system with 50% stenosis in the first marginal and irregularities in the proximal and mid LAD. 5. Overall normal LV function with a small region of inferoapical severe hypokinesis/akinesis.   Past Medical History  Diagnosis Date  . Asthma   .  CAD (coronary artery disease)     a. 01/2014 Inf STEMI/PCI: LM nl, LAD 50p/m, LCX 30-49m, OM1 50, OM2/3/4 nl, RCA (anomalous) 100p (3.5x23 Xience Alpine DES), EF 60;   . HTN (hypertension)   . Ischemic cardiomyopathy     ab. 01/2014 Echo: EF 45-50%, inferoseptal/inf sev HK, Gr 1  DD, nl valves.    Past Surgical History  Procedure Laterality Date  . Coronary stent placement  02/25/14  . Left heart catheterization with coronary angiogram N/A 02/25/2014    Procedure: LEFT HEART CATHETERIZATION WITH CORONARY ANGIOGRAM;  Surgeon: Sinclair Grooms, MD;  Location: [facility] CATH LAB;  Service: Cardiovascular;  Laterality: N/A;  . Percutaneous coronary stent intervention (pci-s)  02/25/2014    Procedure: PERCUTANEOUS CORONARY STENT INTERVENTION (PCI-S);  Surgeon: Sinclair Grooms, MD;  Location: [facility] CATH LAB;  Service: Cardiovascular;;     Current Outpatient Prescriptions  Medication Sig Dispense Refill  . albuterol (PROVENTIL HFA;VENTOLIN HFA) 108 (90 BASE) MCG/ACT inhaler Inhale 2 puffs into the lungs every 6 (six) hours as needed for wheezing or shortness of breath.    Marland Kitchen aspirin EC 81 MG tablet Take 1 tablet (81 mg total) by mouth daily.    Marland Kitchen atorvastatin (LIPITOR) 40 MG tablet Take 1 tablet (40 mg total) by mouth daily at 6 PM. 90 tablet 2  . budesonide (PULMICORT) 180 MCG/ACT inhaler Inhale 1 puff into the lungs 2 (two) times daily as needed (shortness of breath). 1 Inhaler 0  . cetirizine (ZYRTEC) 10 MG tablet Take 10 mg by mouth daily.  1  . CVS ACID REDUCER 10 MG tablet Take 10 mg by mouth daily.  1  . diphenhydrAMINE (SOMINEX) 25 MG tablet Take 50 mg by mouth 2 (two) times daily as needed for allergies.     . fexofenadine (ALLEGRA) 180 MG tablet Take 180 mg by mouth daily as needed for allergies.     Marland Kitchen loratadine (CLARITIN) 10 MG tablet Take 10 mg by mouth daily as needed for allergies.   6  . metoprolol tartrate (LOPRESSOR) 25 MG tablet Take 1 tablet (25 mg total) by mouth 2 (two) times daily. 180 tablet 2  . montelukast (SINGULAIR) 10 MG tablet Take 10 mg by mouth daily.  1  . nitroGLYCERIN (NITROSTAT) 0.4 MG SL tablet Place 1 tablet (0.4 mg total) under the tongue every 5 (five) minutes x 3 doses as needed for chest pain. 25 tablet 3  . predniSONE (DELTASONE) 50 MG  tablet Take 1 tablet (50 mg total) by mouth daily. Take 1 tablet Twice daily for 3 days then stop. Repeat if neccasary 5 tablet 0   No current facility-administered medications for this visit.    Allergies:   Review of patient's allergies indicates no known allergies.    Social History:  The patient  reports that he has never smoked. He does not have any smokeless tobacco history on file. He reports that he drinks about 1.8 oz of alcohol per week.   Family History:  The patient's family history includes Colon cancer in his mother; Diabetes in his father; Diverticulosis in his sister; Heart attack in his father; Heart disease in his father and maternal aunt; Hypertension in his father.    ROS:   Please see the history of present illness.   Review of Systems  All other systems reviewed and are negative.     PHYSICAL EXAM: VS:  BP 120/60 mmHg  Pulse 59  Ht 5\' 8"  (1.727 m)  Wt  168 lb (76.204 kg)  BMI 25.55 kg/m2    Wt Readings from Last 3 Encounters:  03/30/15 168 lb (76.204 kg)  02/02/15 171 lb 9.6 oz (77.837 kg)  01/06/15 171 lb 12.8 oz (77.928 kg)     GEN: Well nourished, well developed, in no acute distress HEENT: normal Neck: no JVD, no carotid bruits, no masses Cardiac:  Normal S1/S2, RRR; no murmur ,  no rubs or gallops, no edema   Respiratory:  clear to auscultation bilaterally, no wheezing, rhonchi or rales. GI: soft, nontender, nondistended, + BS MS: no deformity or atrophy Skin: warm and dry  Neuro:  CNs II-XII intact, Strength and sensation are intact Psych: Normal affect   EKG:  EKG is ordered today.  It demonstrates:   Sinus bradycardia, HR 59, normal axis, nonspecific ST-T wave changes, no change from prior tracing   Recent Labs: 09/05/2014: ALT 32    Lipid Panel    Component Value Date/Time   CHOL 120 09/05/2014 1029   TRIG 80.0 09/05/2014 1029   HDL 32.70* 09/05/2014 1029   CHOLHDL 4 09/05/2014 1029   VLDL 16.0 09/05/2014 1029   LDLCALC 71  09/05/2014 1029      ASSESSMENT AND PLAN:  Coronary artery disease involving native coronary artery of native heart without angina pectoris:  No angina. Continue aspirin, statin, beta blocker.  Ischemic cardiomyopathy:  EF normal on recent Myoview. Given his recent issues with urticaria, it would be best to avoid initiation of an ACE inhibitor at this point in time. This can certainly be considered in the future. Continue beta blocker.  HLD (hyperlipidemia):  Continue statin. Recent LDL optimal.  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 Formal testing is still pending. He is currently on anti-histamines, prednisone and avoiding mammalian meats.  He is still concerned regarding an allergy to the metal in the stent.  I have tried to reassure him.   Asthma: He is starting to have some recurrence of symptoms of asthma. He requests a refill of his Pulmicort. I will provide this to him 1. Future refills will be with his primary care physician.    Medication Changes: Current medicines are reviewed at length with the patient today.  Concerns regarding medicines are as outlined above.  The following changes have been made:   Discontinued Medications   No medications on file   Modified Medications   Modified Medication Previous Medication   BUDESONIDE (PULMICORT) 180 MCG/ACT INHALER budesonide (PULMICORT) 180 MCG/ACT inhaler      Inhale 1 puff into the lungs 2 (two) times daily as needed (shortness of breath).    Inhale 1 puff into the lungs 2 (two) times daily as needed (shortness of breath).   NITROGLYCERIN (NITROSTAT) 0.4 MG SL TABLET nitroGLYCERIN (NITROSTAT) 0.4 MG SL tablet      Place 1 tablet (0.4 mg total) under the tongue every 5 (five) minutes x 3 doses as needed for chest pain.    Place 1 tablet (0.4 mg total) under the tongue every 5 (five) minutes x 3 doses as needed for chest pain.   New Prescriptions   No medications on file    Labs/ tests ordered  today include:   Orders Placed This Encounter  Procedures  . EKG 12-Lead     Disposition:   FU with Dr. Daneen Schick 3 mos.     Signed, Versie Starks, MHS 03/30/2015 4:37 PM    [facility] [address]  [address], [place], [place]  [address] Phone: [phone]; Fax: [phone]

## 2015-03-30 ENCOUNTER — Encounter: Payer: Self-pay | Admitting: Physician Assistant

## 2015-03-30 ENCOUNTER — Ambulatory Visit (INDEPENDENT_AMBULATORY_CARE_PROVIDER_SITE_OTHER): Payer: Medicare Other | Admitting: Physician Assistant

## 2015-03-30 VITALS — BP 120/60 | HR 59 | Ht 68.0 in | Wt 168.0 lb

## 2015-03-30 DIAGNOSIS — E785 Hyperlipidemia, unspecified: Secondary | ICD-10-CM

## 2015-03-30 DIAGNOSIS — I2510 Atherosclerotic heart disease of native coronary artery without angina pectoris: Secondary | ICD-10-CM

## 2015-03-30 DIAGNOSIS — I255 Ischemic cardiomyopathy: Secondary | ICD-10-CM | POA: Diagnosis not present

## 2015-03-30 DIAGNOSIS — D485 Neoplasm of uncertain behavior of skin: Secondary | ICD-10-CM | POA: Diagnosis not present

## 2015-03-30 DIAGNOSIS — D239 Other benign neoplasm of skin, unspecified: Secondary | ICD-10-CM | POA: Diagnosis not present

## 2015-03-30 DIAGNOSIS — J45909 Unspecified asthma, uncomplicated: Secondary | ICD-10-CM

## 2015-03-30 DIAGNOSIS — L509 Urticaria, unspecified: Secondary | ICD-10-CM | POA: Diagnosis not present

## 2015-03-30 DIAGNOSIS — L821 Other seborrheic keratosis: Secondary | ICD-10-CM | POA: Diagnosis not present

## 2015-03-30 MED ORDER — NITROGLYCERIN 0.4 MG SL SUBL: 0.4000 mg | SUBLINGUAL_TABLET | SUBLINGUAL | Status: DC | PRN

## 2015-03-30 MED ORDER — BUDESONIDE 180 MCG/ACT IN AEPB: 1.0000 | INHALATION_SPRAY | Freq: Two times a day (BID) | RESPIRATORY_TRACT | Status: DC | PRN

## 2015-03-30 NOTE — Patient Instructions (Signed)
Medication Instructions:  Refilled Pulmicort this 1 time, further refills should come from PCP Refilled Nitroglycerin (requested 2 bottles per refill)  Labwork: NONE  Testing/Procedures: NONE  Follow-Up: Dr. Tamala Julian in 3 months  Any Other Special Instructions Will Be Listed Below (If Applicable).

## 2015-04-19 DIAGNOSIS — L500 Allergic urticaria: Secondary | ICD-10-CM | POA: Diagnosis not present

## 2015-04-19 DIAGNOSIS — T7800XA Anaphylactic reaction due to unspecified food, initial encounter: Secondary | ICD-10-CM | POA: Diagnosis not present

## 2015-06-29 ENCOUNTER — Ambulatory Visit (INDEPENDENT_AMBULATORY_CARE_PROVIDER_SITE_OTHER): Payer: Medicare Other | Admitting: Interventional Cardiology

## 2015-06-29 ENCOUNTER — Encounter: Payer: Self-pay | Admitting: Interventional Cardiology

## 2015-06-29 VITALS — BP 130/80 | HR 64 | Ht 68.0 in | Wt 173.4 lb

## 2015-06-29 DIAGNOSIS — I2510 Atherosclerotic heart disease of native coronary artery without angina pectoris: Secondary | ICD-10-CM

## 2015-06-29 DIAGNOSIS — E785 Hyperlipidemia, unspecified: Secondary | ICD-10-CM

## 2015-06-29 DIAGNOSIS — I255 Ischemic cardiomyopathy: Secondary | ICD-10-CM

## 2015-06-29 NOTE — Patient Instructions (Signed)
Your physician recommends that you continue on your current medications as directed. Please refer to the Current Medication list given to you today. Your physician wants you to follow-up in: 1 YEAR WITH DR SMITH.  You will receive a reminder letter in the mail two months in advance. If you don't receive a letter, please call our office to schedule the follow-up appointment.  

## 2015-06-29 NOTE — Progress Notes (Signed)
Cardiology Office Note   Date:  06/29/2015   ID:  Eric Cortez, DOB 03/15/1951, MRN [id number]  PCP:  Curlene Labrum, MD  Cardiologist:  Sinclair Grooms, MD   No chief complaint on file.     History of Present Illness: Eric Cortez is a 64 y.o. 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  No anginal complaints. No medication side effects. Fully active without dyspnea, angina, or palpitations. He denies claudication.  Past Medical History  Diagnosis Date  . Asthma   . CAD (coronary artery disease)     a. 01/2014 Inf STEMI/PCI: LM nl, LAD 50p/m, LCX 30-76m, OM1 50, OM2/3/4 nl, RCA (anomalous) 100p (3.5x23 Xience Alpine DES), EF 60;   . HTN (hypertension)   . Ischemic cardiomyopathy     ab. 01/2014 Echo: EF 45-50%, inferoseptal/inf sev HK, Gr 1 DD, nl valves.    Past Surgical History  Procedure Laterality Date  . Coronary stent placement  02/25/14  . Left heart catheterization with coronary angiogram N/A 02/25/2014    Procedure: LEFT HEART CATHETERIZATION WITH CORONARY ANGIOGRAM;  Surgeon: Sinclair Grooms, MD;  Location: [facility] CATH LAB;  Service: Cardiovascular;  Laterality: N/A;  . Percutaneous coronary stent intervention (pci-s)  02/25/2014    Procedure: PERCUTANEOUS CORONARY STENT INTERVENTION (PCI-S);  Surgeon: Sinclair Grooms, MD;  Location: [facility] CATH LAB;  Service: Cardiovascular;;     Current Outpatient Prescriptions  Medication Sig Dispense Refill  . albuterol (PROVENTIL HFA;VENTOLIN HFA) 108 (90 BASE) MCG/ACT inhaler Inhale 2 puffs into the lungs every 6 (six) hours as needed for wheezing or shortness of breath.    Marland Kitchen aspirin EC 81 MG tablet Take 1 tablet (81 mg total) by mouth daily.    . budesonide (PULMICORT) 180 MCG/ACT inhaler Inhale 1 puff into the lungs 2 (two) times  daily as needed (shortness of breath). 1 Inhaler 0  . cetirizine (ZYRTEC) 10 MG tablet Take 10 mg by mouth daily.  1  . CVS ACID REDUCER 10 MG tablet Take 10 mg by mouth daily.  1  . montelukast (SINGULAIR) 10 MG tablet Take 10 mg by mouth daily.  1  . nitroGLYCERIN (NITROSTAT) 0.4 MG SL tablet Place 1 tablet (0.4 mg total) under the tongue every 5 (five) minutes x 3 doses as needed for chest pain. 25 tablet 3  . predniSONE (DELTASONE) 50 MG tablet Take 1 tablet (50 mg total) by mouth daily. Take 1 tablet Twice daily for 3 days then stop. Repeat if neccasary 5 tablet 0   No current facility-administered medications for this visit.    Allergies:   Review of patient's allergies indicates no known allergies.    Social History:  The patient  reports that he has never smoked. He has never used smokeless tobacco. He reports that he drinks about 1.8 oz of alcohol per week.   Family History:  The patient's family history includes Colon cancer in his mother; Diabetes in his father; Diverticulosis in his sister; Heart attack in his father; Heart disease in his father and maternal aunt; Hypertension in his father.    ROS:  Please see the history of present illness.   Otherwise, review of systems are positive for tick bite allergy with urticaria.   All other systems are reviewed and negative.  PHYSICAL EXAM: VS:  BP 130/80 mmHg  Pulse 64  Ht 5\' 8"  (1.727 m)  Wt 78.654 kg (173 lb 6.4 oz)  BMI 26.37 kg/m2  SpO2 97% , BMI Body mass index is 26.37 kg/(m^2). GEN: Well nourished, well developed, in no acute distress HEENT: normal Neck: no JVD, carotid bruits, or masses Cardiac: RRR.  There is no murmur, rub, or gallop. There is no edema. Respiratory:  clear to auscultation bilaterally, normal work of breathing. GI: soft, nontender, nondistended, + BS MS: no deformity or atrophy Skin: warm and dry, no rash Neuro:  Strength and sensation are intact Psych: euthymic mood, full affect   EKG:  EKG  is not ordered today.    Recent Labs: 09/05/2014: ALT 32    Lipid Panel    Component Value Date/Time   CHOL 120 09/05/2014 1029   TRIG 80.0 09/05/2014 1029   HDL 32.70* 09/05/2014 1029   CHOLHDL 4 09/05/2014 1029   VLDL 16.0 09/05/2014 1029   LDLCALC 71 09/05/2014 1029      Wt Readings from Last 3 Encounters:  06/29/15 78.654 kg (173 lb 6.4 oz)  03/30/15 76.204 kg (168 lb)  02/02/15 77.837 kg (171 lb 9.6 oz)      Other studies Reviewed: Additional studies/ records that were reviewed today include: None. The findings include none.    ASSESSMENT AND PLAN: 1. Coronary artery disease involving native coronary artery of native heart without angina pectoris Asymptomatic  2. Ischemic cardiomyopathy No volume overload or evidence of heart failure  3. HLD (hyperlipidemia) Last LDL was 71 in January 2016  4. Tick bite allergy with urticaria Early on there was some concern that antiplatelet therapy was cause of the rash but this is been disproven.   Current medicines are reviewed at length with the patient today.  The patient has the following concerns regarding medicines: None.  The following changes/actions have been instituted:    Fasting liver and lipid in January 2017  Labs/ tests ordered today include:  No orders of the defined types were placed in this encounter.     Disposition:   FU with HS in 1 year  Signed, Sinclair Grooms, MD  06/29/2015 9:49 AM    [address], [place], [place]  [address] Phone: [phone]; Fax: [phone]

## 2015-07-04 ENCOUNTER — Ambulatory Visit (INDEPENDENT_AMBULATORY_CARE_PROVIDER_SITE_OTHER): Payer: Medicare Other | Admitting: Urology

## 2015-07-04 DIAGNOSIS — N401 Enlarged prostate with lower urinary tract symptoms: Secondary | ICD-10-CM | POA: Diagnosis not present

## 2015-07-04 DIAGNOSIS — N400 Enlarged prostate without lower urinary tract symptoms: Secondary | ICD-10-CM | POA: Diagnosis not present

## 2015-07-04 DIAGNOSIS — R972 Elevated prostate specific antigen [PSA]: Secondary | ICD-10-CM | POA: Diagnosis not present

## 2015-08-29 ENCOUNTER — Other Ambulatory Visit: Payer: Self-pay | Admitting: *Deleted

## 2015-08-29 MED ORDER — BUDESONIDE 180 MCG/ACT IN AEPB: 2.0000 | INHALATION_SPRAY | Freq: Two times a day (BID) | RESPIRATORY_TRACT | Status: DC | PRN

## 2015-09-12 ENCOUNTER — Encounter (INDEPENDENT_AMBULATORY_CARE_PROVIDER_SITE_OTHER): Payer: Self-pay

## 2015-09-12 ENCOUNTER — Encounter: Payer: Self-pay | Admitting: Allergy and Immunology

## 2015-09-12 ENCOUNTER — Ambulatory Visit (INDEPENDENT_AMBULATORY_CARE_PROVIDER_SITE_OTHER): Payer: Medicare Other | Admitting: Allergy and Immunology

## 2015-09-12 VITALS — BP 128/88 | HR 60 | Resp 16

## 2015-09-12 DIAGNOSIS — J4520 Mild intermittent asthma, uncomplicated: Secondary | ICD-10-CM | POA: Diagnosis not present

## 2015-09-12 DIAGNOSIS — L509 Urticaria, unspecified: Secondary | ICD-10-CM | POA: Diagnosis not present

## 2015-09-12 DIAGNOSIS — T7800XD Anaphylactic reaction due to unspecified food, subsequent encounter: Secondary | ICD-10-CM | POA: Diagnosis not present

## 2015-09-12 DIAGNOSIS — Z91018 Allergy to other foods: Secondary | ICD-10-CM

## 2015-09-12 HISTORY — DX: Allergy to other foods: Z91.018

## 2015-09-12 NOTE — Patient Instructions (Signed)
  1. Consistently use montelukast 10 mg daily and cetirizine 10 mg daily  2. Continue EpiPen if needed  3. Continue pro-air and Pulmicort action plan for asthma flare  4. Return to clinic in 6 months or earlier if problem

## 2015-09-12 NOTE — Progress Notes (Signed)
[place]  Follow-up Note  Referring Provider: Curlene Labrum, MD Primary Provider: Curlene Labrum, MD Date of Office Visit: 09/12/2015  Subjective:   Eric Cortez is a 65 y.o. 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 While remaining away from all mammal consumption he is done relatively well but as he tapers off his medications he finds that he develops more problems with intermittent urticaria. He'll now developed intermittent episodes urticaria with about 5 urticarial lesions per episode with about 6 episodes over the course the past 6 months while he attempts to taper down his medications. He has no systemic or constitutional symptoms associated with these reactions. His lesions never heal with scar or hyperpigmentation. He did receive the flu vaccine and the Prevnar vaccine this fall. He's only had one flare of his asthma requiring him to use Pulmicort any short acting bronchodilator for possibly 2-3 weeks but otherwise has had no significant bronchospastic symptoms and no need to use any systemic steroids to treat an exacerbation.   Current Outpatient Prescriptions on File Prior to Visit  Medication Sig Dispense Refill  . albuterol (PROVENTIL HFA;VENTOLIN HFA) 108 (90 BASE) MCG/ACT inhaler Inhale 2 puffs into the lungs every 6 (six) hours as needed for wheezing or shortness of breath.    Marland Kitchen aspirin EC 81 MG tablet Take 1 tablet (81 mg total) by mouth daily.    . budesonide (PULMICORT) 180 MCG/ACT inhaler Inhale 2 puffs into the lungs 2 (two) times daily as needed (shortness of breath). 1 Inhaler 1  . cetirizine (ZYRTEC) 10 MG tablet Take 10 mg by mouth daily.  1  . montelukast (SINGULAIR) 10 MG tablet Take 10 mg by mouth daily.  1  . nitroGLYCERIN  (NITROSTAT) 0.4 MG SL tablet Place 1 tablet (0.4 mg total) under the tongue every 5 (five) minutes x 3 doses as needed for chest pain. 25 tablet 3  . CVS ACID REDUCER 10 MG tablet Take 10 mg by mouth daily. Reported on 09/12/2015  1   No current facility-administered medications on file prior to visit.    No orders of the defined types were placed in this encounter.    Past Medical History  Diagnosis Date  . Asthma   . CAD (coronary artery disease)     a. 01/2014 Inf STEMI/PCI: LM nl, LAD 50p/m, LCX 30-51m, OM1 50, OM2/3/4 nl, RCA (anomalous) 100p (3.5x23 Xience Alpine DES), EF 60;   . HTN (hypertension)   . Ischemic cardiomyopathy     ab. 01/2014 Echo: EF 45-50%, inferoseptal/inf sev HK, Gr 1 DD, nl valves.    Past Surgical History  Procedure Laterality Date  . Coronary stent placement  02/25/14  . Left heart catheterization with coronary angiogram N/A 02/25/2014    Procedure: LEFT HEART CATHETERIZATION WITH CORONARY ANGIOGRAM;  Surgeon: Eric Grooms, MD;  Location: [facility] CATH LAB;  Service: Cardiovascular;  Laterality: N/A;  . Percutaneous coronary stent intervention (pci-s)  02/25/2014    Procedure: PERCUTANEOUS CORONARY STENT INTERVENTION (PCI-S);  Surgeon: Eric Grooms, MD;  Location: [facility] CATH LAB;  Service: Cardiovascular;;    No Known Allergies  Review of systems negative except as noted in HPI / PMHx or noted below:  Review of Systems  Constitutional: Negative.   HENT: Negative.  Eyes: Negative.   Respiratory: Negative.   Cardiovascular: Negative.   Gastrointestinal: Negative.   Genitourinary: Negative.   Musculoskeletal: Negative.   Skin: Negative.   Neurological: Negative.   Endo/Heme/Allergies: Negative.   Psychiatric/Behavioral: Negative.      Objective:   Filed Vitals:   09/12/15 1125  BP: 128/88  Pulse: 60  Resp: 16          Physical Exam  Constitutional: He is well-developed, well-nourished, and in no distress. No distress.  HENT:  Head:  Normocephalic.  Right Ear: Tympanic membrane, external ear and ear canal normal.  Left Ear: Tympanic membrane, external ear and ear canal normal.  Nose: Nose normal. No mucosal edema or rhinorrhea.  Mouth/Throat: Uvula is midline, oropharynx is clear and moist and mucous membranes are normal. No oropharyngeal exudate.  Hearing aids  Eyes: Conjunctivae are normal.  Neck: Trachea normal. No tracheal tenderness present. No tracheal deviation present. No thyromegaly present.  Cardiovascular: Normal rate, regular rhythm, S1 normal, S2 normal and normal heart sounds.   No murmur heard. Pulmonary/Chest: Breath sounds normal. No stridor. No respiratory distress. He has no wheezes. He has no rales.  Musculoskeletal: He exhibits no edema.  Lymphadenopathy:       Head (right side): No tonsillar adenopathy present.       Head (left side): No tonsillar adenopathy present.    He has no cervical adenopathy.    He has no axillary adenopathy.  Neurological: He is alert. Gait normal.  Skin: No rash noted. He is not diaphoretic. No erythema. Nails show no clubbing.  Psychiatric: Mood and affect normal.    Diagnostics:    Spirometry was performed and demonstrated an FEV1 of 3.34 at 115 % of predicted.  The patient had an Asthma Control Test with the following results: ACT Total Score: 17.    Assessment and Plan:   1. Urticaria   2. Allergy with anaphylaxis due to food, subsequent encounter   3. Allergy to meat   4. Mild intermittent asthma, uncomplicated      1. Consistently use montelukast 10 mg daily and cetirizine 10 mg daily  2. Continue EpiPen if needed  3. Continue pro-air and Pulmicort action plan for asthma flare  4. Return to clinic in 6 months or earlier if problem  Eric Cortez needs to consistently use his montelukast and cetirizine in an attempt to prevent him from developing recurrent bouts of urticaria. And of course she should avoid all mammal consumption given his high titer of  alpha gal antibodies documented last year. I will be available in this clinic should he develop significant problems with the plan mentioned above but otherwise we'll just see him back in this clinic in 6 months.  Eric Katz, MD [place]

## 2015-10-02 ENCOUNTER — Other Ambulatory Visit: Payer: Self-pay | Admitting: Allergy and Immunology

## 2015-10-02 MED ORDER — MONTELUKAST SODIUM 10 MG PO TABS: 10.0000 mg | ORAL_TABLET | Freq: Every day | ORAL | Status: DC

## 2015-10-02 MED ORDER — CETIRIZINE HCL 10 MG PO TABS: 10.0000 mg | ORAL_TABLET | Freq: Every day | ORAL | Status: DC

## 2015-10-02 NOTE — Telephone Encounter (Signed)
Pt wife called and ask to have all rx done for 90 days, because easier  That way . The singulair needs refilling now. [place] [facility] number [phone]. [facility] number is [phone].

## 2015-10-02 NOTE — Telephone Encounter (Signed)
Sent in medications and patient notified.

## 2015-10-19 ENCOUNTER — Telehealth: Payer: Self-pay | Admitting: Allergy and Immunology

## 2015-10-19 NOTE — Telephone Encounter (Signed)
Patient received bill from [facility], has a question. Pls call back

## 2015-10-19 NOTE — Telephone Encounter (Signed)
No answer - left message that I will try again tomorrow.

## 2015-10-20 ENCOUNTER — Telehealth: Payer: Self-pay

## 2015-10-20 NOTE — Telephone Encounter (Signed)
PT CALLED BACK TODAY - TOLD HIM WE WILL FILE [employer] - FILING ORDER WAS INCORRECT

## 2015-10-20 NOTE — Telephone Encounter (Signed)
FILED [place] HAD THE FILING ORDER INCORRECT

## 2015-10-20 NOTE — Telephone Encounter (Signed)
Pt. has a question about his bill. He's saying that he has an extra insurance that's [employer] that's not listed

## 2015-12-27 ENCOUNTER — Other Ambulatory Visit: Payer: Self-pay

## 2015-12-27 ENCOUNTER — Telehealth: Payer: Self-pay | Admitting: Allergy and Immunology

## 2015-12-27 MED ORDER — CETIRIZINE HCL 10 MG PO TABS: 10.0000 mg | ORAL_TABLET | Freq: Every day | ORAL | Status: DC

## 2015-12-27 NOTE — Telephone Encounter (Signed)
Pt wife called and needs the zyrtec 10 mg calling into a new pharmacy [facility]  In [place] and need 90day supply with refills so they want have to call so much.

## 2015-12-27 NOTE — Telephone Encounter (Signed)
Sent in refill

## 2016-03-19 ENCOUNTER — Encounter: Payer: Self-pay | Admitting: Allergy and Immunology

## 2016-03-19 ENCOUNTER — Ambulatory Visit (INDEPENDENT_AMBULATORY_CARE_PROVIDER_SITE_OTHER): Payer: Medicare Other | Admitting: Allergy and Immunology

## 2016-03-19 VITALS — BP 136/84 | HR 60 | Resp 14

## 2016-03-19 DIAGNOSIS — T7800XD Anaphylactic reaction due to unspecified food, subsequent encounter: Secondary | ICD-10-CM

## 2016-03-19 DIAGNOSIS — L509 Urticaria, unspecified: Secondary | ICD-10-CM

## 2016-03-19 DIAGNOSIS — J4520 Mild intermittent asthma, uncomplicated: Secondary | ICD-10-CM | POA: Diagnosis not present

## 2016-03-19 DIAGNOSIS — Z91018 Allergy to other foods: Secondary | ICD-10-CM | POA: Diagnosis not present

## 2016-03-19 NOTE — Patient Instructions (Signed)
  1. Consistently cetirizine 10 mg daily  2. Continue EpiPen if needed  3. Continue pro-air and Pulmicort action plan for asthma flare  4. Return to clinic in 1 year or earlier if problem  5. Obtain fall flu vaccine

## 2016-03-19 NOTE — Progress Notes (Signed)
Follow-up Note  Referring Provider: Curlene Labrum, MD Primary Provider: Curlene Labrum, MD Date of Office Visit: 03/19/2016  Subjective:   Eric Cortez (DOB: 10-29-1950) is a 65 y.o. male who returns to the [facility] on 03/19/2016 in re-evaluation of the following:  HPI: Azariyah presents to this clinic in reevaluation of his alpha gal syndrome and chronic urticaria and mild intermittent asthma. I've not seen him in his clinic in 6 months.  During the interval he has attempted to taper down his medications and he has found that he does okay without the use of montelukast but when he tapers off cetirizine he develops urticaria. At cetirizine 10 mg one time per day he does very well. He still remains away from all mammal consumption.  Asthma has not been an issue and he can exercise without any difficulty and rarely uses any pro-air and has not had activate an action plan inclusive of Pulmicort and pro-air..    Medication List           albuterol 108 (90 Base) MCG/ACT inhaler  Commonly known as:  PROVENTIL HFA;VENTOLIN HFA  Inhale 2 puffs into the lungs every 6 (six) hours as needed for wheezing or shortness of breath.     aspirin EC 81 MG tablet  Take 1 tablet (81 mg total) by mouth daily.     budesonide 180 MCG/ACT inhaler  Commonly known as:  PULMICORT  Inhale 2 puffs into the lungs 2 (two) times daily as needed (shortness of breath).     cetirizine 10 MG tablet  Commonly known as:  ZYRTEC  Take 1 tablet (10 mg total) by mouth daily.     co-enzyme Q-10 30 MG capsule  Take 30 mg by mouth daily.     [employer] ACID REDUCER 10 MG tablet  Generic drug:  famotidine  Take 10 mg by mouth daily. Reported on 09/12/2015     montelukast 10 MG tablet  Commonly known as:  SINGULAIR  Take 1 tablet (10 mg total) by mouth daily.     nitroGLYCERIN 0.4 MG SL tablet  Commonly known as:  NITROSTAT  Place 1 tablet (0.4 mg total) under the tongue every 5 (five) minutes  x 3 doses as needed for chest pain.        Past Medical History  Diagnosis Date  . Asthma   . CAD (coronary artery disease)     a. 01/2014 Inf STEMI/PCI: LM nl, LAD 50p/m, LCX 30-72m, OM1 50, OM2/3/4 nl, RCA (anomalous) 100p (3.5x23 Xience Alpine DES), EF 60;   . HTN (hypertension)   . Ischemic cardiomyopathy     ab. 01/2014 Echo: EF 45-50%, inferoseptal/inf sev HK, Gr 1 DD, nl valves.  . Urticaria   . 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 Coronary stent placement  02/25/14  . Left heart catheterization with coronary angiogram N/A 02/25/2014    Procedure: LEFT HEART CATHETERIZATION WITH CORONARY ANGIOGRAM;  Surgeon: Sinclair Grooms, MD;  Location[facility] CATH LAB;  Service: Cardiovascular;  Laterality: N/A;  . Percutaneous coronary stent intervention (pci-s)  02/25/2014    Procedure: PERCUTANEOUS CORONARY STENT INTERVENTION (PCI-S);  Surgeon: Sinclair Grooms, MD;  Location: [facility] CATH LAB;  Service: Cardiovascular;;    No Known Allergies  Review of systems negative except as noted in HPI / PMHx or noted below:  Review of Systems  Constitutional: Negative.   HENT: Negative.   Eyes: Negative.   Respiratory: Negative.   Cardiovascular: Negative.   Gastrointestinal: Negative.   Genitourinary: Negative.   Musculoskeletal: Negative.   Skin: Negative.   Neurological: Negative.   Endo/Heme/Allergies: Negative.   Psychiatric/Behavioral: Negative.      Objective:   Filed Vitals:   03/19/16 1025  BP: 136/84  Pulse: 60  Resp: 14          Physical Exam  Constitutional: He is well-developed, well-nourished, and in no distress.  HENT:  Head: Normocephalic.  Right Ear: Tympanic membrane, external ear and ear canal normal.  Left Ear: Tympanic membrane, external ear and ear canal normal.  Nose: Nose normal. No mucosal edema or rhinorrhea.  Mouth/Throat: Uvula is midline, oropharynx is clear and moist  and mucous membranes are normal. No oropharyngeal exudate.  Eyes: Conjunctivae are normal.  Neck: Trachea normal. No tracheal tenderness present. No tracheal deviation present. No thyromegaly present.  Cardiovascular: Normal rate, regular rhythm, S1 normal, S2 normal and normal heart sounds.   No murmur heard. Pulmonary/Chest: Breath sounds normal. No stridor. No respiratory distress. He has no wheezes. He has no rales.  Musculoskeletal: He exhibits no edema.  Lymphadenopathy:       Head (right side): No tonsillar adenopathy present.       Head (left side): No tonsillar adenopathy present.    He has no cervical adenopathy.  Neurological: He is alert. Gait normal.  Skin: No rash noted. He is not diaphoretic. No erythema. Nails show no clubbing.  Psychiatric: Mood and affect normal.    Diagnostics:    Spirometry was performed and demonstrated an FEV1 of 3.41 at 110 % of predicted.  The patient had an Asthma Control Test with the following results: ACT Total Score: 25.    Assessment and Plan:   1. Urticaria   2. Allergy with anaphylaxis due to food, subsequent encounter   3. Allergy to meat   4. Mild intermittent asthma, uncomplicated     1. Consistently cetirizine 10 mg daily  2. Continue EpiPen if needed  3. Continue pro-air and Pulmicort action plan for asthma flare  4. Return to clinic in 1 year or earlier if problem  5. Obtain fall flu vaccine  Garlin is doing very well on his current medical plan. It is quite obvious that he needs to remain on cetirizine on a consistent basis and needs to remain away from mammal consumption at this point. I think that we can see him back in this clinic in approximately one year. If he is interested we could recheck his titers of alpha gal at that point in time and see if he possibly has resolved this issue. He will contact me during the interval should there be a significant problem.  Allena Katz, MD [place]

## 2016-05-13 ENCOUNTER — Other Ambulatory Visit: Payer: Self-pay | Admitting: *Deleted

## 2016-05-13 MED ORDER — CETIRIZINE HCL 10 MG PO TABS: 10.0000 mg | ORAL_TABLET | Freq: Every day | ORAL | 2 refills | Status: DC

## 2016-06-18 ENCOUNTER — Ambulatory Visit (INDEPENDENT_AMBULATORY_CARE_PROVIDER_SITE_OTHER): Payer: Medicare Other | Admitting: Urology

## 2016-06-18 DIAGNOSIS — N400 Enlarged prostate without lower urinary tract symptoms: Secondary | ICD-10-CM | POA: Diagnosis not present

## 2016-06-18 DIAGNOSIS — R972 Elevated prostate specific antigen [PSA]: Secondary | ICD-10-CM | POA: Diagnosis not present

## 2016-07-02 ENCOUNTER — Encounter: Payer: Self-pay | Admitting: Interventional Cardiology

## 2016-07-15 ENCOUNTER — Ambulatory Visit: Payer: Medicare Other | Admitting: Interventional Cardiology

## 2016-07-22 DIAGNOSIS — Z23 Encounter for immunization: Secondary | ICD-10-CM | POA: Diagnosis not present

## 2016-08-08 DIAGNOSIS — Z955 Presence of coronary angioplasty implant and graft: Secondary | ICD-10-CM | POA: Insufficient documentation

## 2016-08-08 HISTORY — DX: Presence of coronary angioplasty implant and graft: Z95.5

## 2016-08-09 ENCOUNTER — Ambulatory Visit (INDEPENDENT_AMBULATORY_CARE_PROVIDER_SITE_OTHER): Payer: Medicare Other | Admitting: Interventional Cardiology

## 2016-08-09 ENCOUNTER — Encounter: Payer: Self-pay | Admitting: Interventional Cardiology

## 2016-08-09 VITALS — BP 124/78 | HR 74 | Ht 69.0 in | Wt 164.6 lb

## 2016-08-09 DIAGNOSIS — I2119 ST elevation (STEMI) myocardial infarction involving other coronary artery of inferior wall: Secondary | ICD-10-CM | POA: Diagnosis not present

## 2016-08-09 DIAGNOSIS — I255 Ischemic cardiomyopathy: Secondary | ICD-10-CM | POA: Diagnosis not present

## 2016-08-09 DIAGNOSIS — Z955 Presence of coronary angioplasty implant and graft: Secondary | ICD-10-CM | POA: Diagnosis not present

## 2016-08-09 DIAGNOSIS — I2510 Atherosclerotic heart disease of native coronary artery without angina pectoris: Secondary | ICD-10-CM | POA: Diagnosis not present

## 2016-08-09 DIAGNOSIS — E784 Other hyperlipidemia: Secondary | ICD-10-CM

## 2016-08-09 DIAGNOSIS — E7849 Other hyperlipidemia: Secondary | ICD-10-CM

## 2016-08-09 NOTE — Progress Notes (Signed)
Cardiology Office Note    Date:  08/09/2016   ID:  Eric Cortez, DOB 06/12/1951, MRN [id number]  PCP:  Curlene Labrum, MD  Cardiologist: Sinclair Grooms, MD   Chief Complaint  Patient presents with  . Coronary Artery Disease    History of Present Illness:  Eric Cortez is a 65 y.o. 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  He is doing well. For a period of time he had gotten off atorvastatin because he thought he could handle lowering his lipids by diet and exercise. No side effects to atorvastatin. He is back on therapy. Management is followed by primary care at the [facility].   Past Medical History:  Diagnosis Date  . Asthma   . CAD (coronary artery disease)    a. 01/2014 Inf STEMI/PCI: LM nl, LAD 50p/m, LCX 30-4m, OM1 50, OM2/3/4 nl, RCA (anomalous) 100p (3.5x23 Xience Alpine DES), EF 60;   . 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 HTN (hypertension)   . Ischemic cardiomyopathy    ab. 01/2014 Echo: EF 45-50%, inferoseptal/inf sev HK, Gr 1 DD, nl valves.  . Urticaria     Past Surgical History:  Procedure Laterality Date  . CORONARY STENT PLACEMENT  02/25/14  . LEFT HEART CATHETERIZATION WITH CORONARY ANGIOGRAM N/A 02/25/2014   Procedure: LEFT HEART CATHETERIZATION WITH CORONARY ANGIOGRAM;  Surgeon: Sinclair Grooms, MD;  Location: [facility] CATH LAB;  Service: Cardiovascular;  Laterality: N/A;  . PERCUTANEOUS CORONARY STENT INTERVENTION (PCI-S)  02/25/2014   Procedure: PERCUTANEOUS CORONARY STENT INTERVENTION (PCI-S);  Surgeon: Sinclair Grooms, MD;  Location: [facility] CATH LAB;  Service: Cardiovascular;;    Current Medications: Outpatient Medications Prior to Visit  Medication Sig Dispense Refill  . albuterol (PROVENTIL HFA;VENTOLIN HFA) 108 (90 BASE)  MCG/ACT inhaler Inhale 2 puffs into the lungs every 6 (six) hours as needed for wheezing or shortness of breath.    Marland Kitchen aspirin EC 81 MG tablet Take 1 tablet (81 mg total) by mouth daily.    . budesonide (PULMICORT) 180 MCG/ACT inhaler Inhale 2 puffs into the lungs 2 (two) times daily as needed (shortness of breath). 1 Inhaler 1  . cetirizine (ZYRTEC) 10 MG tablet Take 1 tablet (10 mg total) by mouth daily. 90 tablet 2  . nitroGLYCERIN (NITROSTAT) 0.4 MG SL tablet Place 1 tablet (0.4 mg total) under the tongue every 5 (five) minutes x 3 doses as needed for chest pain. 25 tablet 3   No facility-administered medications prior to visit.      Allergies:   Patient has no known allergies.   Social History   Social History  . Marital status: Married    Spouse name: N/A  . Number of children: N/A  . Years of education: N/A   Social History Main Topics  . Smoking status: Never Smoker  . Smokeless tobacco: Never Used  . Alcohol use 1.8 oz/week    3 Glasses of wine per week     Comment: occassional ETOH  . Drug use: No  . Sexual activity: Not Asked   Other Topics Concern  . None   Social History Narrative  . None     Family History:  The patient's family history includes Colon cancer in his mother; Diabetes in  his father; Diverticulosis in his sister; Heart attack in his father; Heart disease in his father and maternal aunt; Hypertension in his father.   ROS:   Please see the history of present illness.    He has no specific complaints.  All other systems reviewed and are negative.   PHYSICAL EXAM:   VS:  BP 124/78 (BP Location: Left Arm)   Pulse 74   Ht 5\' 9"  (1.753 m)   Wt 164 lb 9.6 oz (74.7 kg)   BMI 24.31 kg/m    GEN: Well nourished, well developed, in no acute distress  HEENT: normal  Neck: no JVD, carotid bruits, or masses Cardiac: RRR; no murmurs, rubs, or gallops,no edema  Respiratory:  clear to auscultation bilaterally, normal work of breathing GI: soft, nontender,  nondistended, + BS MS: no deformity or atrophy  Skin: warm and dry, no rash Neuro:  Alert and Oriented x 3, Strength and sensation are intact Psych: euthymic mood, full affect  Wt Readings from Last 3 Encounters:  08/09/16 164 lb 9.6 oz (74.7 kg)  06/29/15 173 lb 6.4 oz (78.7 kg)  03/30/15 168 lb (76.2 kg)      Studies/Labs Reviewed:   EKG:  EKG  Normal sinus rhythm, small inferior Q waves. Inferior Q waves are seen in the context of nondiagnostic Q waves in V5 and V6.   Recent Labs: No results found for requested labs within last 8760 hours.   Lipid Panel    Component Value Date/Time   CHOL 120 09/05/2014 1029   TRIG 80.0 09/05/2014 1029   HDL 32.70 (L) 09/05/2014 1029   CHOLHDL 4 09/05/2014 1029   VLDL 16.0 09/05/2014 1029   LDLCALC 71 09/05/2014 1029    Additional studies/ records that were reviewed today include:  No new data here. Last LDL in our system was 71 in January 2016. He will have his most recent laboratory data sent to me from the [facility].  Echocardiogram 02/25/14: Study Conclusions  - Left ventricle: The cavity size was normal. Wall thickness was normal. Systolic function was mildly reduced. The estimated ejection fraction was in the range of 45% to 50%. Inferoseptal and inferior severe hypokinesis. Doppler parameters are consistent with abnormal left ventricular relaxation (grade 1 diastolic dysfunction). - Aortic valve: There was no stenosis. - Mitral valve: There was no significant regurgitation. - Right ventricle: The cavity size was normal. Systolic function was normal. - Pulmonary arteries: No complete TR doppler jet so unable to estimate PA systolic pressure. - Inferior vena cava: The vessel was normal in size. The respirophasic diameter changes were in the normal range (= 50%), consistent with normal central venous pressure.  Impressions:  - Normal LV size with mildly decreased systolic function, EF [id number].  Severe hypokinesis of the inferoseptal and inferior walls. Normal RV size and systoilc function. No significant valvular abnormalities.  ASSESSMENT:    1. Coronary artery disease involving native coronary artery of native heart without angina pectoris   2. Ischemic cardiomyopathy   3. Acute MI, inferior wall, initial episode of care ([place])   4. Presence of stent in right coronary artery   5. Other hyperlipidemia      PLAN:  In order of problems listed above:  1. No further evaluation at this time. Stay active. Call if any chest complaints. 2. Low normal to mildly depressed LV function after infarction. EF 45-50%. Has not been reevaluated. 3. No recurrence of acute symptoms. 4. Now off dual antiplatelet therapy and taking  only aspirin. 5. I encouraged him to remain on statin therapy and reminded him that the target LDL of 70. Even though does being followed at the [place] he should take ownership of these metrics and ensure his therapy is allowing him to reach target.    Medication Adjustments/Labs and Tests Ordered: Current medicines are reviewed at length with the patient today.  Concerns regarding medicines are outlined above.  Medication changes, Labs and Tests ordered today are listed in the Patient Instructions below. There are no Patient Instructions on file for this visit.   Signed, Sinclair Grooms, MD  08/09/2016 2:28 PM    [facility] [address], [place], [place]  [address] Phone: [phone]; Fax: [phone]

## 2016-08-09 NOTE — Patient Instructions (Signed)

## 2016-08-12 DIAGNOSIS — Z86010 Personal history of colonic polyps: Secondary | ICD-10-CM | POA: Diagnosis not present

## 2016-08-12 DIAGNOSIS — Z85038 Personal history of other malignant neoplasm of large intestine: Secondary | ICD-10-CM | POA: Diagnosis not present

## 2016-08-22 DIAGNOSIS — Z9861 Coronary angioplasty status: Secondary | ICD-10-CM | POA: Diagnosis not present

## 2016-08-22 DIAGNOSIS — Z95818 Presence of other cardiac implants and grafts: Secondary | ICD-10-CM | POA: Diagnosis not present

## 2016-08-22 DIAGNOSIS — Z7982 Long term (current) use of aspirin: Secondary | ICD-10-CM | POA: Diagnosis not present

## 2016-08-22 DIAGNOSIS — Z79899 Other long term (current) drug therapy: Secondary | ICD-10-CM | POA: Diagnosis not present

## 2016-08-22 DIAGNOSIS — S61012A Laceration without foreign body of left thumb without damage to nail, initial encounter: Secondary | ICD-10-CM | POA: Diagnosis not present

## 2016-08-22 DIAGNOSIS — S62522B Displaced fracture of distal phalanx of left thumb, initial encounter for open fracture: Secondary | ICD-10-CM | POA: Diagnosis not present

## 2016-08-22 DIAGNOSIS — S62525B Nondisplaced fracture of distal phalanx of left thumb, initial encounter for open fracture: Secondary | ICD-10-CM | POA: Diagnosis not present

## 2016-08-28 DIAGNOSIS — S61219A Laceration without foreign body of unspecified finger without damage to nail, initial encounter: Secondary | ICD-10-CM | POA: Diagnosis not present

## 2016-08-29 DIAGNOSIS — M79645 Pain in left finger(s): Secondary | ICD-10-CM

## 2016-08-29 DIAGNOSIS — S61012D Laceration without foreign body of left thumb without damage to nail, subsequent encounter: Secondary | ICD-10-CM | POA: Diagnosis not present

## 2016-08-29 DIAGNOSIS — S61012A Laceration without foreign body of left thumb without damage to nail, initial encounter: Secondary | ICD-10-CM | POA: Diagnosis not present

## 2016-08-29 DIAGNOSIS — S61002D Unspecified open wound of left thumb without damage to nail, subsequent encounter: Secondary | ICD-10-CM | POA: Diagnosis not present

## 2016-08-29 HISTORY — DX: Pain in left finger(s): M79.645

## 2016-11-25 DIAGNOSIS — D124 Benign neoplasm of descending colon: Secondary | ICD-10-CM | POA: Diagnosis not present

## 2016-11-25 DIAGNOSIS — Z85038 Personal history of other malignant neoplasm of large intestine: Secondary | ICD-10-CM | POA: Diagnosis not present

## 2017-03-19 ENCOUNTER — Encounter: Payer: Self-pay | Admitting: Allergy and Immunology

## 2017-03-19 ENCOUNTER — Ambulatory Visit (INDEPENDENT_AMBULATORY_CARE_PROVIDER_SITE_OTHER): Payer: Medicare Other | Admitting: Allergy and Immunology

## 2017-03-19 VITALS — BP 132/82 | HR 50 | Resp 18

## 2017-03-19 DIAGNOSIS — L509 Urticaria, unspecified: Secondary | ICD-10-CM | POA: Diagnosis not present

## 2017-03-19 DIAGNOSIS — J4520 Mild intermittent asthma, uncomplicated: Secondary | ICD-10-CM

## 2017-03-19 DIAGNOSIS — T7800XD Anaphylactic reaction due to unspecified food, subsequent encounter: Secondary | ICD-10-CM | POA: Diagnosis not present

## 2017-03-19 MED ORDER — BUDESONIDE 180 MCG/ACT IN AEPB: INHALATION_SPRAY | RESPIRATORY_TRACT | 1 refills | Status: DC

## 2017-03-19 MED ORDER — ALBUTEROL SULFATE (2.5 MG/3ML) 0.083% IN NEBU: INHALATION_SOLUTION | RESPIRATORY_TRACT | 1 refills | Status: DC

## 2017-03-19 NOTE — Progress Notes (Signed)
Follow-up Note  Referring Provider: Curlene Labrum, MD Primary Provider: Curlene Labrum, MD Date of Office Visit: 03/19/2017  Subjective:   Eric Cortez (DOB: 1950/10/09) is a 66 y.o. male who returns to the [facility] on 03/19/2017 in re-evaluation of the following:  HPI: Eric Cortez presents to this clinic in evaluation of his alpha gal syndrome and history of urticaria and history of mild intermittent asthma. His last visit to this clinic was approximately 1 year ago.  He has really done well while avoiding mammal. He has not had any urticaria as long as he consistently uses cetirizine. He has not required a systemic steroid or antibiotic to treat any type of respiratory tract issue and he exercises without any difficulty going to a local gym 3 times per week and using a treadmill. Rarely does he use a short acting bronchodilator.  Allergies as of 03/19/2017   No Known Allergies     Medication List      albuterol 108 (90 Base) MCG/ACT inhaler Commonly known as:  PROVENTIL HFA;VENTOLIN HFA Inhale 2 puffs into the lungs every 6 (six) hours as needed for wheezing or shortness of breath.   aspirin EC 81 MG tablet Take 1 tablet (81 mg total) by mouth daily.   budesonide 180 MCG/ACT inhaler Commonly known as:  PULMICORT Inhale 2 puffs into the lungs 2 (two) times daily as needed (shortness of breath).   cetirizine 10 MG tablet Commonly known as:  ZYRTEC Take 1 tablet (10 mg total) by mouth daily.   nitroGLYCERIN 0.4 MG SL tablet Commonly known as:  NITROSTAT Place 1 tablet (0.4 mg total) under the tongue every 5 (five) minutes x 3 doses as needed for chest pain.       Past Medical History:  Diagnosis Date  . Asthma   . CAD (coronary artery disease)    a. 01/2014 Inf STEMI/PCI: LM nl, LAD 50p/m, LCX 30-63m, OM1 50, OM2/3/4 nl, RCA (anomalous) 100p (3.5x23 Xience Alpine DES), EF 60;   . 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 HTN  (hypertension)   . Ischemic cardiomyopathy    ab. 01/2014 Echo: EF 45-50%, inferoseptal/inf sev HK, Gr 1 DD, nl valves.  . Urticaria     Past Surgical History:  Procedure Laterality Date  . CORONARY STENT PLACEMENT  02/25/14  . LEFT HEART CATHETERIZATION WITH CORONARY ANGIOGRAM N/A 02/25/2014   Procedure: LEFT HEART CATHETERIZATION WITH CORONARY ANGIOGRAM;  Surgeon: Sinclair Grooms, MD;  Location: [facility] CATH LAB;  Service: Cardiovascular;  Laterality: N/A;  . PERCUTANEOUS CORONARY STENT INTERVENTION (PCI-S)  02/25/2014   Procedure: PERCUTANEOUS CORONARY STENT INTERVENTION (PCI-S);  Surgeon: Sinclair Grooms, MD;  Location: [facility] CATH LAB;  Service: Cardiovascular;;    Review of systems negative except as noted in HPI / PMHx or noted below:  Review of Systems  Constitutional: Negative.   HENT: Negative.   Eyes: Negative.   Respiratory: Negative.   Cardiovascular: Negative.   Gastrointestinal: Negative.   Genitourinary: Negative.   Musculoskeletal: Negative.   Skin: Negative.   Neurological: Negative.   Endo/Heme/Allergies: Negative.   Psychiatric/Behavioral: Negative.      Objective:   Vitals:   03/19/17 1018  BP: 132/82  Pulse: (!) 50  Resp: 18          Physical Exam  Constitutional: He is well-developed, well-nourished, and in no distress.  HENT:  Head: Normocephalic.  Right Ear: Tympanic membrane,  external ear and ear canal normal.  Left Ear: Tympanic membrane, external ear and ear canal normal.  Nose: Nose normal. No mucosal edema or rhinorrhea.  Mouth/Throat: Uvula is midline, oropharynx is clear and moist and mucous membranes are normal. No oropharyngeal exudate.  Eyes: Conjunctivae are normal.  Neck: Trachea normal. No tracheal tenderness present. No tracheal deviation present. No thyromegaly present.  Cardiovascular: Normal rate, regular rhythm, S1 normal, S2 normal and normal heart sounds.   No murmur heard. Pulmonary/Chest: Breath sounds normal. No stridor.  No respiratory distress. He has no wheezes. He has no rales.  Musculoskeletal: He exhibits no edema.  Lymphadenopathy:       Head (right side): No tonsillar adenopathy present.       Head (left side): No tonsillar adenopathy present.    He has no cervical adenopathy.  Neurological: He is alert. Gait normal.  Skin: No rash noted. He is not diaphoretic. No erythema. Nails show no clubbing.  Psychiatric: Mood and affect normal.    Diagnostics:    Spirometry was performed and demonstrated an FEV1 of 3.43 at 105 % of predicted.  The patient had an Asthma Control Test with the following results: ACT Total Score: 25.    Assessment and Plan:   1. Allergy with anaphylaxis due to food, subsequent encounter   2. Urticaria   3. Mild intermittent asthma, uncomplicated     1. Consistently cetirizine 10 mg daily  2. Continue EpiPen if needed  3. Continue pro-air / albuterol neb and Pulmicort action plan for asthma flare  4. Return to clinic in 1 year or earlier if problem  5. Obtain fall flu vaccine  Eric Cortez has really done very well with attention to allergen avoidance measures and the use of an antihistamine and we will now see him back in this clinic in approximately one year or earlier if there is a problem. He will continue to use a short-acting bronchodilator as needed, which currently is a minimal requirement, and he will activate an action plan should that be required for an asthma flare that may occur in the future. Finally, I did have a talk with him today about immunizations. He received the Pneumovax several years ago and I asked him to follow-up with his primary care doctor to receive the Prevnar and a booster of Pneumovax and the shingles vaccine.  Eric Katz, MD [facility]ology [place]

## 2017-03-19 NOTE — Patient Instructions (Signed)
  1. Consistently cetirizine 10 mg daily  2. Continue EpiPen if needed  3. Continue pro-air / albuterol neb and Pulmicort action plan for asthma flare  4. Return to clinic in 1 year or earlier if problem  5. Obtain fall flu vaccine

## 2017-03-26 ENCOUNTER — Telehealth: Payer: Self-pay | Admitting: *Deleted

## 2017-03-26 MED ORDER — FLUTICASONE FUROATE 200 MCG/ACT IN AEPB: 1.0000 | INHALATION_SPRAY | Freq: Every day | RESPIRATORY_TRACT | 3 refills | Status: DC

## 2017-03-26 NOTE — Telephone Encounter (Signed)
Patient's insurance does not cover Pulmicort. They prefer Arnuity or Flovent. Patient was on Pulmicort 180 as needed for flares. Please advise.

## 2017-03-26 NOTE — Telephone Encounter (Signed)
Script sent into pharmacy 

## 2017-03-26 NOTE — Telephone Encounter (Signed)
Please provide Arnuity 200 with prescription for one inhalation 1 time per day which she should use as part of an action plan at 2 inhalations twice a day

## 2017-03-26 NOTE — Addendum Note (Signed)
Addended by: Angelica Ran on: 03/26/2017 03:14 PM   Modules accepted: Orders

## 2017-06-10 ENCOUNTER — Ambulatory Visit (INDEPENDENT_AMBULATORY_CARE_PROVIDER_SITE_OTHER): Payer: Medicare Other | Admitting: Urology

## 2017-06-10 DIAGNOSIS — N401 Enlarged prostate with lower urinary tract symptoms: Secondary | ICD-10-CM

## 2017-06-10 DIAGNOSIS — R972 Elevated prostate specific antigen [PSA]: Secondary | ICD-10-CM | POA: Diagnosis not present

## 2017-07-21 DIAGNOSIS — Z23 Encounter for immunization: Secondary | ICD-10-CM | POA: Diagnosis not present

## 2017-07-30 ENCOUNTER — Encounter: Payer: Self-pay | Admitting: Interventional Cardiology

## 2017-08-06 NOTE — Progress Notes (Signed)
Cardiology Office Note    Date:  08/07/2017   ID:  Eric Cortez, DOB 1951-08-16, MRN [id number]  PCP:  Curlene Labrum, MD  Cardiologist: Sinclair Grooms, MD   Chief Complaint  Patient presents with  . Coronary Artery Disease    History of Present Illness:  Eric Cortez is a 66 y.o. 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   He is doing well.  No anginal complaints.  Had a [employer] screen earlier this year that was negative.  Transiently went off of atorvastatin but now has resumed 20 mg/day.  Been on it now for approximately 3 weeks.  No cardiac symptoms.   Past Medical History:  Diagnosis Date  . Asthma   . CAD (coronary artery disease)    a. 01/2014 Inf STEMI/PCI: LM nl, LAD 50p/m, LCX 30-63m, OM1 50, OM2/3/4 nl, RCA (anomalous) 100p (3.5x23 Xience Alpine DES), EF 60;   . 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 HTN (hypertension)   . Ischemic cardiomyopathy    ab. 01/2014 Echo: EF 45-50%, inferoseptal/inf sev HK, Gr 1 DD, nl valves.  . Urticaria     Past Surgical History:  Procedure Laterality Date  . CORONARY STENT PLACEMENT  02/25/14  . LEFT HEART CATHETERIZATION WITH CORONARY ANGIOGRAM N/A 02/25/2014   Procedure: LEFT HEART CATHETERIZATION WITH CORONARY ANGIOGRAM;  Surgeon: Sinclair Grooms, MD;  Location: [facility] CATH LAB;  Service: Cardiovascular;  Laterality: N/A;  . PERCUTANEOUS CORONARY STENT INTERVENTION (PCI-S)  02/25/2014   Procedure: PERCUTANEOUS CORONARY STENT INTERVENTION (PCI-S);  Surgeon: Sinclair Grooms, MD;  Location: [facility] CATH LAB;  Service: Cardiovascular;;    Current Medications: Outpatient Medications Prior to Visit  Medication Sig Dispense Refill  . albuterol (PROVENTIL HFA;VENTOLIN HFA) 108 (90 BASE) MCG/ACT inhaler Inhale 2 puffs  into the lungs every 6 (six) hours as needed for wheezing or shortness of breath.    Marland Kitchen albuterol (PROVENTIL) (2.5 MG/3ML) 0.083% nebulizer solution Use one vial in nebulizer every four to six hours as needed for cough or wheeze. 90 vial 1  . aspirin EC 81 MG tablet Take 1 tablet (81 mg total) by mouth daily.    . budesonide (PULMICORT) 180 MCG/ACT inhaler Inhale two doses twice daily during flare-up as directed. Rinse, gargle, and spit after use. 3 Inhaler 1  . cetirizine (ZYRTEC) 10 MG tablet Take 1 tablet (10 mg total) by mouth daily. 90 tablet 2  . Fluticasone Furoate (ARNUITY ELLIPTA) 200 MCG/ACT AEPB Inhale 1 puff into the lungs daily. 30 each 3  . nitroGLYCERIN (NITROSTAT) 0.4 MG SL tablet Place 1 tablet (0.4 mg total) under the tongue every 5 (five) minutes x 3 doses as needed for chest pain. 25 tablet 3   No facility-administered medications prior to visit.      Allergies:   Patient has no known allergies.   Social History   Socioeconomic History  . Marital status: Married    Spouse name: None  . Number of children: None  . Years of education: None  . Highest education level: None  Social Needs  . Financial resource strain: None  . Food insecurity - worry: None  . Food insecurity - inability: None  . Transportation needs - medical: None  . Transportation needs -  non-medical: None  Occupational History  . None  Tobacco Use  . Smoking status: Never Smoker  . Smokeless tobacco: Never Used  Substance and Sexual Activity  . Alcohol use: Yes    Alcohol/week: 1.8 oz    Types: 3 Glasses of wine per week    Comment: occassional ETOH  . Drug use: No  . Sexual activity: None  Other Topics Concern  . None  Social History Narrative  . None     Family History:  The patient's family history includes Colon cancer in his mother; Diabetes in his father; Diverticulosis in his sister; Heart attack in his father; Heart disease in his father and maternal aunt; Hypertension in his  father.   ROS:   Please see the history of present illness.    No complaints currently but transiently discontinued atorvastatin because of muscle aches. All other systems reviewed and are negative.   PHYSICAL EXAM:   VS:  BP 132/88   Pulse 71   Ht 5\' 9"  (1.753 m)   Wt 168 lb 3.2 oz (76.3 kg)   BMI 24.84 kg/m    GEN: Well nourished, well developed, in no acute distress  HEENT: normal  Neck: no JVD, carotid bruits, or masses Cardiac: RRR; no murmurs, rubs, or gallops,no edema  Respiratory:  clear to auscultation bilaterally, normal work of breathing GI: soft, nontender, nondistended, + BS MS: no deformity or atrophy  Skin: warm and dry, no rash Neuro:  Alert and Oriented x 3, Strength and sensation are intact Psych: euthymic mood, full affect  Wt Readings from Last 3 Encounters:  08/07/17 168 lb 3.2 oz (76.3 kg)  08/09/16 164 lb 9.6 oz (74.7 kg)  06/29/15 173 lb 6.4 oz (78.7 kg)      Studies/Labs Reviewed:   EKG:  EKG normal sinus rhythm with evidence of prior inferolateral infarction.  Otherwise unremarkable.  Recent Labs: No results found for requested labs within last 8760 hours.   Lipid Panel    Component Value Date/Time   CHOL 120 09/05/2014 1029   TRIG 80.0 09/05/2014 1029   HDL 32.70 (L) 09/05/2014 1029   CHOLHDL 4 09/05/2014 1029   VLDL 16.0 09/05/2014 1029   LDLCALC 71 09/05/2014 1029    Additional studies/ records that were reviewed today include:  No new data    ASSESSMENT:    1. Coronary artery disease involving native coronary artery of native heart without angina pectoris   2. Ischemic cardiomyopathy   3. Other hyperlipidemia      PLAN:  In order of problems listed above:  1. Stable with prior history of inferolateral infarction due to occlusion of the anomalously arising right coronary from the left sinus of Valsalva.  Off dual antiplatelet therapy without recurrent ischemic symptoms. 2. LV recovery back to normal functional status.   No evidence of heart failure.  Echo EF 50% 2015. 3. LDL target will be less than 70.  We are resuming atorvastatin.  Liver and lipid panel will be done in 2019/January/February timeframe.   Clinical follow-up in 1 year.    Medication Adjustments/Labs and Tests Ordered: Current medicines are reviewed at length with the patient today.  Concerns regarding medicines are outlined above.  Medication changes, Labs and Tests ordered today are listed in the Patient Instructions below. Patient Instructions  Medication Instructions:  Your physician recommends that you continue on your current medications as directed. Please refer to the Current Medication list given to you today.   Labwork: Your physician recommends  that you return for lab work in 2 months (Liver and Lipid).   Testing/Procedures: None   Follow-Up: Your physician wants you to follow-up in 1 year with Dr. Tamala Julian. You will receive a reminder letter in the mail two months in advance. If you don't receive a letter, please call our office to schedule the follow-up appointment.   Any Other Special Instructions Will Be Listed Below (If Applicable).     If you need a refill on your cardiac medications before your next appointment, please call your pharmacy.      Signed, Sinclair Grooms, MD  08/07/2017 11:13 AM    [facility] [address], [place], [place]  [address] Phone: [phone]; Fax: [phone]

## 2017-08-07 ENCOUNTER — Encounter: Payer: Self-pay | Admitting: Interventional Cardiology

## 2017-08-07 ENCOUNTER — Ambulatory Visit (INDEPENDENT_AMBULATORY_CARE_PROVIDER_SITE_OTHER): Payer: Medicare Other | Admitting: Interventional Cardiology

## 2017-08-07 VITALS — BP 132/88 | HR 71 | Ht 69.0 in | Wt 168.2 lb

## 2017-08-07 DIAGNOSIS — I255 Ischemic cardiomyopathy: Secondary | ICD-10-CM

## 2017-08-07 DIAGNOSIS — I2510 Atherosclerotic heart disease of native coronary artery without angina pectoris: Secondary | ICD-10-CM

## 2017-08-07 DIAGNOSIS — E7849 Other hyperlipidemia: Secondary | ICD-10-CM

## 2017-08-07 MED ORDER — ATORVASTATIN CALCIUM 20 MG PO TABS: 20.0000 mg | ORAL_TABLET | Freq: Every day | ORAL | 3 refills | Status: DC

## 2017-08-07 NOTE — Patient Instructions (Signed)
Medication Instructions:  Your physician recommends that you continue on your current medications as directed. Please refer to the Current Medication list given to you today.   Labwork: Your physician recommends that you return for lab work in 2 months (Liver and Lipid).   Testing/Procedures: None   Follow-Up: Your physician wants you to follow-up in 1 year with Dr. Tamala Julian. You will receive a reminder letter in the mail two months in advance. If you don't receive a letter, please call our office to schedule the follow-up appointment.   Any Other Special Instructions Will Be Listed Below (If Applicable).     If you need a refill on your cardiac medications before your next appointment, please call your pharmacy.

## 2017-08-15 ENCOUNTER — Other Ambulatory Visit: Payer: Self-pay | Admitting: Podiatry

## 2017-08-15 ENCOUNTER — Encounter: Payer: Self-pay | Admitting: Podiatry

## 2017-08-15 ENCOUNTER — Ambulatory Visit (INDEPENDENT_AMBULATORY_CARE_PROVIDER_SITE_OTHER): Payer: Medicare Other

## 2017-08-15 ENCOUNTER — Ambulatory Visit (INDEPENDENT_AMBULATORY_CARE_PROVIDER_SITE_OTHER): Payer: Medicare Other | Admitting: Podiatry

## 2017-08-15 DIAGNOSIS — M779 Enthesopathy, unspecified: Secondary | ICD-10-CM

## 2017-08-15 DIAGNOSIS — M25572 Pain in left ankle and joints of left foot: Secondary | ICD-10-CM | POA: Diagnosis not present

## 2017-08-15 MED ORDER — TRIAMCINOLONE ACETONIDE 10 MG/ML IJ SUSP
10.0000 mg | Freq: Once | INTRAMUSCULAR | Status: AC
  Administered 2017-08-15: 10 mg

## 2017-08-15 NOTE — Progress Notes (Signed)
Dg ankle 

## 2017-08-16 NOTE — Progress Notes (Signed)
Subjective:   Patient ID: Eric Cortez, male   DOB: 66 y.o.   MRN: [id number]   HPI Patient presents with extreme pain in the dorsum of the left ankle and states that he has had this scoped without relief and that it has been going on for probably 60 years.  Patient states he thinks he injured it when he was a child and is gradually become more of a problem and patient does not smoke and likes to be active   Review of Systems  All other systems reviewed and are negative.       Objective:  Physical Exam  Constitutional: He appears well-developed and well-nourished.  Cardiovascular: Intact distal pulses.  Pulmonary/Chest: Effort normal.  Musculoskeletal: Normal range of motion.  Neurological: He is alert.  Skin: Skin is warm.  Nursing note and vitals reviewed.   Neurovascular status intact with patient found to have significant diminishment of range of motion of the ankle joint left with inflammation and fluid within the sinus tarsi and ankle joint itself.  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  At this point the only thing we could consider from a surgical standpoint would be ankle fusion and patient is not interested in this and today I went ahead and I injected the sinus tarsi ankle joint 3 mg Kenalog 5 mg Xylocaine advised on reduced activity and discussed possible bracing in the future  X-rays indicate severe arthritis across the ankle joint left with narrowing of the joint surface and multiple signs of arthritis

## 2017-10-10 ENCOUNTER — Other Ambulatory Visit: Payer: Medicare Other

## 2017-12-12 ENCOUNTER — Encounter: Payer: Self-pay | Admitting: Podiatry

## 2017-12-12 ENCOUNTER — Ambulatory Visit (INDEPENDENT_AMBULATORY_CARE_PROVIDER_SITE_OTHER): Payer: Medicare Other | Admitting: Podiatry

## 2017-12-12 DIAGNOSIS — M25572 Pain in left ankle and joints of left foot: Secondary | ICD-10-CM | POA: Diagnosis not present

## 2017-12-12 MED ORDER — TRIAMCINOLONE ACETONIDE 10 MG/ML IJ SUSP
10.0000 mg | Freq: Once | INTRAMUSCULAR | Status: AC
  Administered 2017-12-12: 10 mg

## 2017-12-14 NOTE — Progress Notes (Signed)
Subjective:   Patient ID: Eric Cortez, male   DOB: 67 y.o.   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

## 2018-04-13 ENCOUNTER — Ambulatory Visit: Payer: Medicare Other | Admitting: Podiatry

## 2018-06-16 ENCOUNTER — Ambulatory Visit (INDEPENDENT_AMBULATORY_CARE_PROVIDER_SITE_OTHER): Payer: Medicare Other | Admitting: Urology

## 2018-06-16 DIAGNOSIS — N486 Induration penis plastica: Secondary | ICD-10-CM

## 2018-06-16 DIAGNOSIS — R972 Elevated prostate specific antigen [PSA]: Secondary | ICD-10-CM | POA: Diagnosis not present

## 2018-06-16 DIAGNOSIS — R351 Nocturia: Secondary | ICD-10-CM

## 2018-06-16 DIAGNOSIS — N401 Enlarged prostate with lower urinary tract symptoms: Secondary | ICD-10-CM | POA: Diagnosis not present

## 2018-09-09 ENCOUNTER — Ambulatory Visit: Payer: Medicare Other | Admitting: Interventional Cardiology

## 2018-10-20 ENCOUNTER — Other Ambulatory Visit: Payer: Self-pay | Admitting: Interventional Cardiology

## 2018-11-11 ENCOUNTER — Ambulatory Visit (INDEPENDENT_AMBULATORY_CARE_PROVIDER_SITE_OTHER): Payer: Medicare Other | Admitting: Interventional Cardiology

## 2018-11-11 ENCOUNTER — Encounter: Payer: Self-pay | Admitting: Interventional Cardiology

## 2018-11-11 ENCOUNTER — Other Ambulatory Visit: Payer: Self-pay

## 2018-11-11 ENCOUNTER — Encounter

## 2018-11-11 VITALS — BP 118/78 | HR 76 | Ht 69.0 in | Wt 178.6 lb

## 2018-11-11 DIAGNOSIS — I255 Ischemic cardiomyopathy: Secondary | ICD-10-CM | POA: Diagnosis not present

## 2018-11-11 DIAGNOSIS — E7849 Other hyperlipidemia: Secondary | ICD-10-CM

## 2018-11-11 DIAGNOSIS — I2510 Atherosclerotic heart disease of native coronary artery without angina pectoris: Secondary | ICD-10-CM | POA: Diagnosis not present

## 2018-11-11 MED ORDER — NITROGLYCERIN 0.4 MG SL SUBL: 0.4000 mg | SUBLINGUAL_TABLET | SUBLINGUAL | 3 refills | Status: DC | PRN

## 2018-11-11 NOTE — Progress Notes (Signed)
Cardiology Office Note:    Date:  11/11/2018   ID:  Eric Cortez, DOB 09-Jul-1951, MRN [id number]  PCP:  Eric Labrum, MD  Cardiologist:  Eric Grooms, MD   Referring MD: Eric Labrum, MD   Chief Complaint  Patient presents with  . Coronary Artery Disease    History of Present Illness:    Eric Cortez is a 68 y.o. 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   Eric Cortez is doing well.  He has not had angina.  He is compliant with his current medical regimen.  He denies orthopnea, PND, syncope, and exercise-induced tachycardia/arrhythmia.  All of his blood work and follow-up is done at the [facility] in [place].  His most recent LDL cholesterol was 65 (to the best of his recollection).  He also states that there was no signal to suggest diabetes.  Past Medical History:  Diagnosis Date  . Asthma   . CAD (coronary artery disease)    a. 01/2014 Inf STEMI/PCI: LM nl, LAD 50p/m, LCX 30-11m, OM1 50, OM2/3/4 nl, RCA (anomalous) 100p (3.5x23 Xience Alpine DES), EF 60;   . 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 HTN (hypertension)   . Ischemic cardiomyopathy    ab. 01/2014 Echo: EF 45-50%, inferoseptal/inf sev HK, Gr 1 DD, nl valves.  . Urticaria     Past Surgical History:  Procedure Laterality Date  . CORONARY STENT PLACEMENT  02/25/14  . LEFT HEART CATHETERIZATION WITH CORONARY ANGIOGRAM N/A 02/25/2014   Procedure: LEFT HEART CATHETERIZATION WITH CORONARY ANGIOGRAM;  Surgeon: Eric Grooms, MD;  Location: [facility] CATH LAB;  Service: Cardiovascular;  Laterality: N/A;  . PERCUTANEOUS CORONARY STENT INTERVENTION (PCI-S)  02/25/2014   Procedure: PERCUTANEOUS CORONARY STENT INTERVENTION (PCI-S);  Surgeon: Eric Grooms, MD;  Location: [facility] CATH LAB;  Service:  Cardiovascular;;    Current Medications: Current Meds  Medication Sig  . albuterol (PROVENTIL HFA;VENTOLIN HFA) 108 (90 BASE) MCG/ACT inhaler Inhale 2 puffs into the lungs every 6 (six) hours as needed for wheezing or shortness of breath.  Marland Kitchen albuterol (PROVENTIL) (2.5 MG/3ML) 0.083% nebulizer solution Use one vial in nebulizer every four to six hours as needed for cough or wheeze.  Marland Kitchen aspirin EC 81 MG tablet Take 1 tablet (81 mg total) by mouth daily.  Marland Kitchen atorvastatin (LIPITOR) 20 MG tablet TAKE 1 TABLET BY MOUTH ONCE DAILY  . budesonide (PULMICORT) 180 MCG/ACT inhaler Inhale two doses twice daily during flare-up as directed. Rinse, gargle, and spit after use.  . cetirizine (ZYRTEC) 10 MG tablet Take 1 tablet (10 mg total) by mouth daily.  . Fluticasone Furoate (ARNUITY ELLIPTA) 200 MCG/ACT AEPB Inhale 1 puff into the lungs daily.  . nitroGLYCERIN (NITROSTAT) 0.4 MG SL tablet Place 1 tablet (0.4 mg total) under the tongue every 5 (five) minutes x 3 doses as needed for chest pain.  . [DISCONTINUED] nitroGLYCERIN (NITROSTAT) 0.4 MG SL tablet Place 1 tablet (0.4 mg total) under the tongue every 5 (five) minutes x 3 doses as needed for chest pain.     Allergies:   Patient has no known allergies.   Social History   Socioeconomic History  . Marital status: Married    Spouse name: Not on file  .  Number of children: Not on file  . Years of education: Not on file  . Highest education level: Not on file  Occupational History  . Not on file  Social Needs  . Financial resource strain: Not on file  . Food insecurity:    Worry: Not on file    Inability: Not on file  . Transportation needs:    Medical: Not on file    Non-medical: Not on file  Tobacco Use  . Smoking status: Never Smoker  . Smokeless tobacco: Never Used  Substance and Sexual Activity  . Alcohol use: Yes    Alcohol/week: 3.0 standard drinks    Types: 3 Glasses of wine per week    Comment: occassional ETOH  . Drug use: No  .  Sexual activity: Not on file  Lifestyle  . Physical activity:    Days per week: Not on file    Minutes per session: Not on file  . Stress: Not on file  Relationships  . Social connections:    Talks on phone: Not on file    Gets together: Not on file    Attends religious service: Not on file    Active member of club or organization: Not on file    Attends meetings of clubs or organizations: Not on file    Relationship status: Not on file  Other Topics Concern  . Not on file  Social History Narrative  . Not on file     Family History: The patient's family history includes Colon cancer in his mother; Diabetes in his father; Diverticulosis in his sister; Heart attack in his father; Heart disease in his father and maternal aunt; Hypertension in his father.  ROS:   Please see the history of present illness.    No complaints all other systems reviewed and are negative.  EKGs/Labs/Other Studies Reviewed:    The following studies were reviewed today: No new data  EKG:  EKG sinus rhythm with small inferior Q waves.  Otherwise normal.  No change when compared to prior.  Recent Labs: No results found for requested labs within last 8760 hours.  Recent Lipid Panel    Component Value Date/Time   CHOL 120 09/05/2014 1029   TRIG 80.0 09/05/2014 1029   HDL 32.70 (L) 09/05/2014 1029   CHOLHDL 4 09/05/2014 1029   VLDL 16.0 09/05/2014 1029   LDLCALC 71 09/05/2014 1029    Physical Exam:    VS:  BP 118/78   Pulse 76   Ht 5\' 9"  (1.753 m)   Wt 178 lb 9.6 oz (81 kg)   BMI 26.37 kg/m     Wt Readings from Last 3 Encounters:  11/11/18 178 lb 9.6 oz (81 kg)  08/07/17 168 lb 3.2 oz (76.3 kg)  08/09/16 164 lb 9.6 oz (74.7 kg)     GEN: Healthy appearing. No acute distress HEENT: Normal NECK: No JVD. LYMPHATICS: No lymphadenopathy CARDIAC: RRR.  No murmur, no gallop, no edema VASCULAR: 2+ bilateral radial pulses, no bruits RESPIRATORY:  Clear to auscultation without rales, wheezing  or rhonchi  ABDOMEN: Soft, non-tender, non-distended, No pulsatile mass, MUSCULOSKELETAL: No deformity  SKIN: Warm and dry NEUROLOGIC:  Alert and oriented x 3 PSYCHIATRIC:  Normal affect   ASSESSMENT:    1. Coronary artery disease involving native coronary artery of native heart without angina pectoris   2. Ischemic cardiomyopathy   3. Other hyperlipidemia    PLAN:    In order of problems listed above:  1. He  is doing well without significant symptoms to suggest angina.  We discussed in detail secondary prevention. 2. No evidence of LV dysfunction. 3. LDL cholesterol less than 70 discussed.  Overall education and awareness concerning primary/secondary risk prevention was discussed in detail: LDL less than 70, hemoglobin A1c less than 7, blood pressure target less than 130/80 mmHg, >150 minutes of moderate aerobic activity per week, avoidance of smoking, weight control (via diet and exercise), and continued surveillance/management of/for obstructive sleep apnea.  1 year clinical follow-up.   Medication Adjustments/Labs and Tests Ordered: Current medicines are reviewed at length with the patient today.  Concerns regarding medicines are outlined above.  Orders Placed This Encounter  Procedures  . EKG 12-Lead   Meds ordered this encounter  Medications  . nitroGLYCERIN (NITROSTAT) 0.4 MG SL tablet    Sig: Place 1 tablet (0.4 mg total) under the tongue every 5 (five) minutes x 3 doses as needed for chest pain.    Dispense:  25 tablet    Refill:  3    Please dispense 2 bottles at a time due to multiple housing    Patient Instructions  Medication Instructions:  Your physician recommends that you continue on your current medications as directed. Please refer to the Current Medication list given to you today.  If you need a refill on your cardiac medications before your next appointment, please call your pharmacy.   Lab work: None If you have labs (blood work) drawn today and  your tests are completely normal, you will receive your results only by: Marland Kitchen MyChart Message (if you have MyChart) OR . A paper copy in the mail If you have any lab test that is abnormal or we need to change your treatment, we will call you to review the results.  Testing/Procedures: None  Follow-Up: At [facility], you and your health needs are our priority.  As part of our continuing mission to provide you with exceptional heart care, we have created designated Provider Care Teams.  These Care Teams include your primary Cardiologist (physician) and Advanced Practice Providers (APPs -  Physician Assistants and Nurse Practitioners) who all work together to provide you with the care you need, when you need it. You will need a follow up appointment in 12 months.  Please call our office 2 months in advance to schedule this appointment.  You may see Eric Grooms, MD or one of the following Advanced Practice Providers on your designated Care Team:   Truitt Merle, NP Cecilie Kicks, NP . Kathyrn Drown, NP  Any Other Special Instructions Will Be Listed Below (If Applicable).       Signed, Eric Grooms, MD  11/11/2018 4:36 PM    [facility]

## 2018-11-11 NOTE — Patient Instructions (Signed)

## 2019-01-07 ENCOUNTER — Other Ambulatory Visit: Payer: Self-pay | Admitting: Interventional Cardiology

## 2019-01-12 ENCOUNTER — Ambulatory Visit (INDEPENDENT_AMBULATORY_CARE_PROVIDER_SITE_OTHER): Payer: Medicare Other | Admitting: Allergy and Immunology

## 2019-01-12 ENCOUNTER — Encounter: Payer: Self-pay | Admitting: Allergy and Immunology

## 2019-01-12 ENCOUNTER — Other Ambulatory Visit: Payer: Self-pay

## 2019-01-12 DIAGNOSIS — J3089 Other allergic rhinitis: Secondary | ICD-10-CM

## 2019-01-12 DIAGNOSIS — T7800XD Anaphylactic reaction due to unspecified food, subsequent encounter: Secondary | ICD-10-CM

## 2019-01-12 DIAGNOSIS — J4520 Mild intermittent asthma, uncomplicated: Secondary | ICD-10-CM

## 2019-01-12 MED ORDER — FLUTICASONE PROPIONATE 50 MCG/ACT NA SUSP: NASAL | 4 refills | Status: DC

## 2019-01-12 MED ORDER — ALBUTEROL SULFATE HFA 108 (90 BASE) MCG/ACT IN AERS: 2.0000 | INHALATION_SPRAY | Freq: Four times a day (QID) | RESPIRATORY_TRACT | 1 refills | Status: DC | PRN

## 2019-01-12 MED ORDER — BUDESONIDE 180 MCG/ACT IN AEPB: INHALATION_SPRAY | RESPIRATORY_TRACT | 1 refills | Status: DC

## 2019-01-12 MED ORDER — ALBUTEROL SULFATE (2.5 MG/3ML) 0.083% IN NEBU: INHALATION_SOLUTION | RESPIRATORY_TRACT | 1 refills | Status: DC

## 2019-01-12 MED ORDER — EPINEPHRINE 0.3 MG/0.3ML IJ SOAJ: INTRAMUSCULAR | 3 refills | Status: DC

## 2019-01-12 NOTE — Patient Instructions (Addendum)
  1. Continue to avoid Mammal consumption  2. Continue EpiPen if needed  3. Continue pro-air / albuterol neb if needed and Pulmicort 180 - 2 inhalations 2 times per day for "asthma flare"  4. Use Flonase - 1-2 sprays each nostril during periods of upper airway symptoms.  5. Return to clinic in 1 year or earlier if problem  6. Obtain fall flu vaccine every year

## 2019-01-12 NOTE — Progress Notes (Signed)
[place]   Follow-up Note  Referring Provider: Curlene Labrum, MD Primary Provider: Curlene Labrum, MD Date of Office Visit: 01/12/2019  Subjective:   Eric Cortez (DOB: Jan 22, 1951) is a 68 y.o. male who returns to the [facility] [place] on 01/12/2019 in re-evaluation of the following:  HPI: This is a E-med visit requested by patient who is located at home.  Eric Cortez is followed in this clinic for alpha gal syndrome, mild intermittent asthma, and allergic rhinitis.  His last visit to this clinic was 19 March 2017.  Avoiding all mammal consumption. Has an Epi-pen available.   Asthma OK. Intermittently uses action plan with Pulmicort and takes prednisone 10mg  2 times per day for 2 days during flare twice the past year.  Nose doing well without any problem. Had some nasal congestion earlier this year and had documented deviated septum. Using Flonase intermittently. Scheduled to see ENT at the [facility] as of 01/12/2019   No Known Allergies     Medication List    albuterol 108 (90 Base) MCG/ACT inhaler Commonly known as:  VENTOLIN HFA Inhale 2 puffs into the lungs every 6 (six) hours as needed for wheezing or shortness of breath.   albuterol (2.5 MG/3ML) 0.083% nebulizer solution Commonly known as:  PROVENTIL Use one vial in nebulizer every four to six hours as needed for cough or wheeze.   aspirin EC 81 MG tablet Take 1 tablet (81 mg total) by mouth daily.   atorvastatin 20 MG tablet Commonly known as:  LIPITOR Take 1 tablet by mouth once daily   budesonide 180 MCG/ACT inhaler Commonly known as:  PULMICORT Inhale two doses twice daily during flare-up as directed. Rinse, gargle, and spit after use.   cetirizine 10 MG tablet Commonly known as:  ZYRTEC Take 1 tablet (10 mg total) by mouth daily.   montelukast 10 MG tablet Commonly known as:  SINGULAIR Take 10 mg by mouth at bedtime.   nitroGLYCERIN 0.4  MG SL tablet Commonly known as:  NITROSTAT Place 1 tablet (0.4 mg total) under the tongue every 5 (five) minutes x 3 doses as needed for chest pain.       Past Medical History:  Diagnosis Date  . Asthma   . CAD (coronary artery disease)    a. 01/2014 Inf STEMI/PCI: LM nl, LAD 50p/m, LCX 30-35m, OM1 50, OM2/3/4 nl, RCA (anomalous) 100p (3.5x23 Xience Alpine DES), EF 60;   . 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 HTN (hypertension)   . Ischemic cardiomyopathy    ab. 01/2014 Echo: EF 45-50%, inferoseptal/inf sev HK, Gr 1 DD, nl valves.  . Urticaria     Past Surgical History:  Procedure Laterality Date  . CORONARY STENT PLACEMENT  02/25/14  . LEFT HEART CATHETERIZATION WITH CORONARY ANGIOGRAM N/A 02/25/2014   Procedure: LEFT HEART CATHETERIZATION WITH CORONARY ANGIOGRAM;  Surgeon: Sinclair Grooms, MD;  Location: [facility] CATH LAB;  Service: Cardiovascular;  Laterality: N/A;  . PERCUTANEOUS CORONARY STENT INTERVENTION (PCI-S)  02/25/2014   Procedure: PERCUTANEOUS CORONARY STENT INTERVENTION (PCI-S);  Surgeon: Sinclair Grooms, MD;  Location: [facility] CATH LAB;  Service: Cardiovascular;;    Review of systems negative except as noted in HPI / PMHx or noted below:  Review of Systems  Constitutional: Negative.   HENT: Negative.   Eyes: Negative.   Respiratory: Negative.   Cardiovascular: Negative.   Gastrointestinal:  Negative.   Genitourinary: Negative.   Musculoskeletal: Negative.   Skin: Negative.   Neurological: Negative.   Endo/Heme/Allergies: Negative.   Psychiatric/Behavioral: Negative.      Objective:   There were no vitals filed for this visit.        Physical Exam-deferred  Diagnostics: none  Assessment and Plan:   1. Allergy with anaphylaxis due to food, subsequent encounter   2. Asthma, mild intermittent, well-controlled   3. Perennial allergic rhinitis     1. Continue to avoid Mammal consumption  2. Continue EpiPen if needed  3. Continue  pro-air / albuterol neb if needed and Pulmicort 180 - 2 inhalations 2 times per day for "asthma flare"  4. Use Flonase - 1-2 sprays each nostril during periods of upper airway symptoms.  5. Return to clinic in 1 year or earlier if problem  6. Obtain fall flu vaccine every year  [name] appears to be doing relatively well on his current plan which basically includes avoidance measures directed against mammal consumption and intermittent use of anti-inflammatory agents for his airway.  Assuming he continues to do well I will see him back in this clinic in 1 year or earlier if there is a problem.  Total patient interaction time 20 minutes  Allena Katz, MD Allergy / [place]

## 2019-01-13 ENCOUNTER — Encounter: Payer: Self-pay | Admitting: Allergy and Immunology

## 2019-01-13 NOTE — Progress Notes (Signed)
Patient is at home. Provider is in office. Start Time: 537 End time: 606

## 2019-01-14 ENCOUNTER — Telehealth: Payer: Self-pay | Admitting: *Deleted

## 2019-01-14 NOTE — Telephone Encounter (Signed)
PA approved.

## 2019-01-14 NOTE — Telephone Encounter (Signed)
Insurance denied Pulmicort. PA submitted.

## 2019-04-12 ENCOUNTER — Encounter: Payer: Self-pay | Admitting: Allergy and Immunology

## 2019-04-12 ENCOUNTER — Other Ambulatory Visit: Payer: Self-pay

## 2019-04-12 ENCOUNTER — Ambulatory Visit (INDEPENDENT_AMBULATORY_CARE_PROVIDER_SITE_OTHER): Payer: Medicare Other | Admitting: Allergy and Immunology

## 2019-04-12 DIAGNOSIS — J3089 Other allergic rhinitis: Secondary | ICD-10-CM | POA: Diagnosis not present

## 2019-04-12 DIAGNOSIS — J4520 Mild intermittent asthma, uncomplicated: Secondary | ICD-10-CM

## 2019-04-12 DIAGNOSIS — T7800XD Anaphylactic reaction due to unspecified food, subsequent encounter: Secondary | ICD-10-CM

## 2019-04-12 DIAGNOSIS — L509 Urticaria, unspecified: Secondary | ICD-10-CM | POA: Diagnosis not present

## 2019-04-12 NOTE — Progress Notes (Signed)
[place]   Follow-up Note  Referring Provider: Curlene Labrum, MD Primary Provider: Curlene Labrum, MD Date of Office Visit: 04/12/2019  Subjective:   Eric Cortez (DOB: [birth year]) is a 68 y.o. male who returns to the [facility] [place] on 04/12/2019 in re-evaluation of the following:  HPI: This is a E-med visit requested by patient who is located at home.  Tayler is followed in this clinic for alpha gal syndrome, mild intermittent asthma, and allergic rhinitis.  His last visit to this clinic was 12 Jan 2019.  Doing well but hives continue unless using prednisone 10 mg 1-2 tablets every 3-7 days since May 2020.  He states that he has been using prednisone on and off for over a year and may be even 3 years to control his urticaria.  Restarted montelukast but not sure this helps. Using Zyrtec daily.  Avoiding all mammal and dairy consumption at this point.  Asthma inactive. No SABA use. No problems with nose but did have nasal congestion treated with saline and lubricant and Flonase a few times per week.  Allergies as of 04/12/2019   No Known Allergies     Medication List      albuterol 108 (90 Base) MCG/ACT inhaler Commonly known as: VENTOLIN HFA Inhale 2 puffs into the lungs every 6 (six) hours as needed for wheezing or shortness of breath.   albuterol (2.5 MG/3ML) 0.083% nebulizer solution Commonly known as: PROVENTIL Use one vial in nebulizer every four to six hours as needed for cough or wheeze.   aspirin EC 81 MG tablet Take 1 tablet (81 mg total) by mouth daily.   atorvastatin 20 MG tablet Commonly known as: LIPITOR Take 1 tablet by mouth once daily   budesonide 180 MCG/ACT inhaler Commonly known as: PULMICORT Inhale two doses twice daily during flare-up as directed. Rinse, gargle, and spit after use.   cetirizine 10 MG tablet Commonly known as: ZYRTEC Take 1 tablet (10 mg total) by mouth daily.    EPINEPHrine 0.3 mg/0.3 mL Soaj injection Commonly known as: EPI-PEN Use for life-threatening allergic reactions   fluticasone 50 MCG/ACT nasal spray Commonly known as: Flonase Use 1-2 sprays in each nostril as needed   montelukast 10 MG tablet Commonly known as: SINGULAIR Take 10 mg by mouth at bedtime.   nitroGLYCERIN 0.4 MG SL tablet Commonly known as: NITROSTAT Place 1 tablet (0.4 mg total) under the tongue every 5 (five) minutes x 3 doses as needed for chest pain.       Past Medical History:  Diagnosis Date  . Asthma   . CAD (coronary artery disease)    a. 01/2014 Inf STEMI/PCI: LM nl, LAD 50p/m, LCX 30-16m, OM1 50, OM2/3/4 nl, RCA (anomalous) 100p (3.5x23 Xience Alpine DES), EF 60;   . 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 HTN (hypertension)   . Ischemic cardiomyopathy    ab. 01/2014 Echo: EF 45-50%, inferoseptal/inf sev HK, Gr 1 DD, nl valves.  . Urticaria     Past Surgical History:  Procedure Laterality Date  . CORONARY STENT PLACEMENT  02/25/14  . LEFT HEART CATHETERIZATION WITH CORONARY ANGIOGRAM N/A 02/25/2014   Procedure: LEFT HEART CATHETERIZATION WITH CORONARY ANGIOGRAM;  Surgeon: Sinclair Grooms, MD;  Location: [facility] CATH LAB;  Service: Cardiovascular;  Laterality: N/A;  . PERCUTANEOUS CORONARY STENT INTERVENTION (PCI-S)  02/25/2014   Procedure: PERCUTANEOUS CORONARY STENT INTERVENTION (  PCI-S);  Surgeon: Sinclair Grooms, MD;  Location: [facility] CATH LAB;  Service: Cardiovascular;;    Review of systems negative except as noted in HPI / PMHx or noted below:  Review of Systems  Constitutional: Negative.   HENT: Negative.   Eyes: Negative.   Respiratory: Negative.   Cardiovascular: Negative.   Gastrointestinal: Negative.   Genitourinary: Negative.   Musculoskeletal: Negative.   Skin: Negative.   Neurological: Negative.   Endo/Heme/Allergies: Negative.   Psychiatric/Behavioral: Negative.      Objective:   There were no vitals filed  for this visit.        Physical Exam-deferred  Diagnostics: none   Assessment and Plan:   1. Urticaria   2. Allergy with anaphylaxis due to food, subsequent encounter   3. Asthma, mild intermittent, well-controlled   4. Perennial allergic rhinitis     1. Continue to avoid Mammal consumption  2. Continue EpiPen if needed  3. Continue pro-air / albuterol neb if needed and Pulmicort 180 - 2 inhalations 2 times per day for "asthma flare"  4. Use Flonase - 1-2 sprays each nostril during periods of upper airway symptoms.  5. Submit for Omalizumab administration.   6. Use cetrizine 10 mg - 1-2 tablets 1-2 times per day (MAX = 40 mg)  7. Return to clinic in 12 weeks or earlier if problem  8. 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  This has apparently been a longstanding issue and it has really gotten out of control recently.  He does have alpha gal syndrome but he is avoiding all mammal consumption and avoiding dairy at this point.  He unfortunately is using prednisone intermittently and pretty commonly to treat this issue.  We will now start him on omalizumab and see how he does over the course of the next several weeks / months regarding this approach.  Further evaluation and treatment will be based upon his response.  Allena Katz, MD Allergy / Immunology [place]

## 2019-04-12 NOTE — Patient Instructions (Addendum)
  1. Continue to avoid Mammal consumption  2. Continue EpiPen if needed  3. Continue pro-air / albuterol neb if needed and Pulmicort 180 - 2 inhalations 2 times per day for "asthma flare"  4. Use Flonase - 1-2 sprays each nostril during periods of upper airway symptoms.  5. Submit for Omalizumab administration.   6. Use cetrizine 10 mg - 1-2 tablets 1-2 times per day (MAX = 40 mg)  7. Return to clinic in 12 weeks or earlier if problem  8. Obtain fall flu vaccine (and COVID vaccine)

## 2019-04-13 ENCOUNTER — Encounter: Payer: Self-pay | Admitting: Allergy and Immunology

## 2019-04-19 ENCOUNTER — Telehealth: Payer: Self-pay | Admitting: *Deleted

## 2019-04-19 NOTE — Telephone Encounter (Signed)
-----   Message from Donzetta Starch, [place] sent at 04/12/2019  3:29 PM EDT ----- Regarding: Arvid Right Dr. Neldon Mc would like the patient to start on Xolair for Urticaria. Patient was a Televisit so he needs to come in to sign consent. Patient is seen in the [place] office.

## 2019-04-19 NOTE — Telephone Encounter (Signed)
Spoke to patient and explained affordablity for Xolair. Mailed patient application so we can try to get him on free drug through the foundation.

## 2019-04-30 NOTE — Progress Notes (Signed)
Telephone Visit  Patient is at home.  Provider is in the office. Start time: 3:13 pm End time: 3:33 pm Verbal consent was given to file insurance.

## 2019-05-31 ENCOUNTER — Other Ambulatory Visit: Payer: Self-pay

## 2019-05-31 ENCOUNTER — Ambulatory Visit (INDEPENDENT_AMBULATORY_CARE_PROVIDER_SITE_OTHER): Payer: Medicare Other

## 2019-05-31 DIAGNOSIS — L501 Idiopathic urticaria: Secondary | ICD-10-CM | POA: Diagnosis not present

## 2019-05-31 DIAGNOSIS — L509 Urticaria, unspecified: Secondary | ICD-10-CM

## 2019-05-31 MED ORDER — OMALIZUMAB 150 MG ~~LOC~~ SOLR
300.0000 mg | SUBCUTANEOUS | Status: DC
  Administered 2019-05-31 – 2020-10-06 (×16): 300 mg via SUBCUTANEOUS

## 2019-05-31 NOTE — Progress Notes (Signed)
Immunotherapy   Patient Details  Name: Eric Cortez MRN: [id number] Date of Birth: Nov 29, 1950  05/31/2019  Eric Cortez started Xolair today. He received 300 mg for Urticaria  Patient waited in an exam room for 30 minutes with no problems. Frequency: Every 28 days Epi-Pen: Yes Consent signed and patient instructions given.   [name] 05/31/2019, 9:08 AM

## 2019-06-30 ENCOUNTER — Ambulatory Visit (INDEPENDENT_AMBULATORY_CARE_PROVIDER_SITE_OTHER): Payer: Medicare Other | Admitting: *Deleted

## 2019-06-30 ENCOUNTER — Other Ambulatory Visit: Payer: Self-pay

## 2019-06-30 DIAGNOSIS — L501 Idiopathic urticaria: Secondary | ICD-10-CM

## 2019-07-06 ENCOUNTER — Ambulatory Visit (INDEPENDENT_AMBULATORY_CARE_PROVIDER_SITE_OTHER): Payer: Medicare Other | Admitting: Urology

## 2019-07-06 DIAGNOSIS — R972 Elevated prostate specific antigen [PSA]: Secondary | ICD-10-CM

## 2019-07-06 DIAGNOSIS — N486 Induration penis plastica: Secondary | ICD-10-CM

## 2019-07-06 DIAGNOSIS — N401 Enlarged prostate with lower urinary tract symptoms: Secondary | ICD-10-CM

## 2019-07-06 DIAGNOSIS — R351 Nocturia: Secondary | ICD-10-CM | POA: Diagnosis not present

## 2019-07-15 ENCOUNTER — Other Ambulatory Visit (HOSPITAL_COMMUNITY): Payer: Self-pay | Admitting: Urology

## 2019-07-15 DIAGNOSIS — R972 Elevated prostate specific antigen [PSA]: Secondary | ICD-10-CM

## 2019-07-28 ENCOUNTER — Other Ambulatory Visit: Payer: Self-pay

## 2019-07-28 ENCOUNTER — Ambulatory Visit (INDEPENDENT_AMBULATORY_CARE_PROVIDER_SITE_OTHER): Payer: Medicare Other

## 2019-07-28 DIAGNOSIS — L501 Idiopathic urticaria: Secondary | ICD-10-CM

## 2019-08-10 ENCOUNTER — Ambulatory Visit (HOSPITAL_COMMUNITY)
Admission: RE | Admit: 2019-08-10 | Discharge: 2019-08-10 | Disposition: A | Discharge status: Home / Self Care | Payer: Medicare Other | Source: Ambulatory Visit | Attending: Urology | Admitting: Urology

## 2019-08-10 ENCOUNTER — Encounter (HOSPITAL_COMMUNITY): Payer: Self-pay

## 2019-08-10 ENCOUNTER — Other Ambulatory Visit: Payer: Self-pay

## 2019-08-10 ENCOUNTER — Other Ambulatory Visit: Payer: Self-pay | Admitting: Urology

## 2019-08-10 DIAGNOSIS — J45909 Unspecified asthma, uncomplicated: Secondary | ICD-10-CM | POA: Insufficient documentation

## 2019-08-10 DIAGNOSIS — R972 Elevated prostate specific antigen [PSA]: Secondary | ICD-10-CM | POA: Diagnosis not present

## 2019-08-10 DIAGNOSIS — N486 Induration penis plastica: Secondary | ICD-10-CM | POA: Insufficient documentation

## 2019-08-10 DIAGNOSIS — Z7982 Long term (current) use of aspirin: Secondary | ICD-10-CM | POA: Insufficient documentation

## 2019-08-10 DIAGNOSIS — Z79899 Other long term (current) drug therapy: Secondary | ICD-10-CM | POA: Insufficient documentation

## 2019-08-10 MED ORDER — GENTAMICIN SULFATE 40 MG/ML IJ SOLN
INTRAMUSCULAR | Status: AC
  Administered 2019-08-10: 160 mg via INTRAMUSCULAR
  Filled 2019-08-10: qty 4

## 2019-08-10 MED ORDER — LIDOCAINE HCL (PF) 2 % IJ SOLN
INTRAMUSCULAR | Status: AC
  Administered 2019-08-10: 10 mL
  Filled 2019-08-10: qty 10

## 2019-08-10 MED ORDER — GENTAMICIN SULFATE 40 MG/ML IJ SOLN
160.0000 mg | Freq: Once | INTRAMUSCULAR | Status: AC
  Administered 2019-08-10: 12:00:00 160 mg via INTRAMUSCULAR

## 2019-08-20 ENCOUNTER — Telehealth: Payer: Self-pay | Admitting: *Deleted

## 2019-08-20 NOTE — Telephone Encounter (Signed)
PA for Pulmicort inhaler has been submitted through CoverMyMeds and is currently waiting for approval or denial.

## 2019-08-23 NOTE — Telephone Encounter (Signed)
Approved and sent to pharmacy. I am sending approval notice to scan center as well.

## 2019-08-25 ENCOUNTER — Ambulatory Visit: Payer: Self-pay

## 2019-09-10 ENCOUNTER — Ambulatory Visit (INDEPENDENT_AMBULATORY_CARE_PROVIDER_SITE_OTHER): Payer: Medicare PPO

## 2019-09-10 DIAGNOSIS — L501 Idiopathic urticaria: Secondary | ICD-10-CM | POA: Diagnosis not present

## 2019-09-16 ENCOUNTER — Other Ambulatory Visit: Payer: Self-pay

## 2019-09-16 DIAGNOSIS — R972 Elevated prostate specific antigen [PSA]: Secondary | ICD-10-CM

## 2019-09-17 ENCOUNTER — Ambulatory Visit: Payer: Self-pay

## 2019-10-08 ENCOUNTER — Other Ambulatory Visit: Payer: Self-pay

## 2019-10-08 ENCOUNTER — Ambulatory Visit (INDEPENDENT_AMBULATORY_CARE_PROVIDER_SITE_OTHER): Payer: Medicare PPO

## 2019-10-08 DIAGNOSIS — L501 Idiopathic urticaria: Secondary | ICD-10-CM | POA: Diagnosis not present

## 2019-11-05 ENCOUNTER — Ambulatory Visit: Payer: Self-pay

## 2019-11-17 ENCOUNTER — Ambulatory Visit: Payer: TRICARE For Life (TFL)

## 2019-11-19 ENCOUNTER — Other Ambulatory Visit: Payer: Self-pay

## 2019-11-19 ENCOUNTER — Ambulatory Visit (INDEPENDENT_AMBULATORY_CARE_PROVIDER_SITE_OTHER): Payer: Medicare PPO

## 2019-11-19 DIAGNOSIS — L501 Idiopathic urticaria: Secondary | ICD-10-CM

## 2019-11-27 NOTE — Progress Notes (Signed)
Cardiology Office Note:    Date:  11/29/2019   ID:  Eric Cortez, DOB 13-Apr-1951, MRN [id number]  PCP:  Curlene Labrum, MD  Cardiologist:  Sinclair Grooms, MD   Referring MD: Curlene Labrum, MD   Chief Complaint  Patient presents with  . Coronary Artery Disease    History of Present Illness:    Eric Cortez is a 69 y.o. 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  Eric Cortez is doing well.  His primary provider is the [facility] [place], [place].  He has not had angina pectoris.  He has been relatively active.  He has been careful in the COVID-19 pandemic.  He has not yet received the vaccine and has some hesitancy.  He is practicing social distancing.  He denies angina, claudication, palpitations, syncope, lower extremity edema, orthopnea, and PND.  Past Medical History:  Diagnosis Date  . Asthma   . CAD (coronary artery disease)    a. 01/2014 Inf STEMI/PCI: LM nl, LAD 50p/m, LCX 30-42m, OM1 50, OM2/3/4 nl, RCA (anomalous) 100p (3.5x23 Xience Alpine DES), EF 60;   . 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 HTN (hypertension)   . Ischemic cardiomyopathy    ab. 01/2014 Echo: EF 45-50%, inferoseptal/inf sev HK, Gr 1 DD, nl valves.  . Urticaria     Past Surgical History:  Procedure Laterality Date  . CORONARY STENT PLACEMENT  02/25/14  . LEFT HEART CATHETERIZATION WITH CORONARY ANGIOGRAM N/A 02/25/2014   Procedure: LEFT HEART CATHETERIZATION WITH CORONARY ANGIOGRAM;  Surgeon: Sinclair Grooms, MD;  Location: [facility] CATH LAB;  Service: Cardiovascular;  Laterality: N/A;  . PERCUTANEOUS CORONARY STENT INTERVENTION (PCI-S)  02/25/2014   Procedure: PERCUTANEOUS CORONARY STENT INTERVENTION (PCI-S);  Surgeon: Sinclair Grooms, MD;  Location: [facility] CATH LAB;  Service:  Cardiovascular;;    Current Medications: Current Meds  Medication Sig  . albuterol (PROVENTIL) (2.5 MG/3ML) 0.083% nebulizer solution Use one vial in nebulizer every four to six hours as needed for cough or wheeze.  Marland Kitchen albuterol (VENTOLIN HFA) 108 (90 Base) MCG/ACT inhaler Inhale 2 puffs into the lungs every 6 (six) hours as needed for wheezing or shortness of breath.  . alfuzosin (UROXATRAL) 10 MG 24 hr tablet Take 10 mg by mouth daily.  Marland Kitchen aspirin EC 81 MG tablet Take 1 tablet (81 mg total) by mouth daily.  Marland Kitchen atorvastatin (LIPITOR) 20 MG tablet Take 1 tablet by mouth once daily  . budesonide (PULMICORT) 180 MCG/ACT inhaler Inhale two doses twice daily during flare-up as directed. Rinse, gargle, and spit after use.  Marland Kitchen buPROPion (WELLBUTRIN XL) 150 MG 24 hr tablet Take 150 mg by mouth daily.  Marland Kitchen EPINEPHrine 0.3 mg/0.3 mL IJ SOAJ injection Use for life-threatening allergic reactions  . fluticasone (FLONASE) 50 MCG/ACT nasal spray Use 1-2 sprays in each nostril as needed  . nitroGLYCERIN (NITROSTAT) 0.4 MG SL tablet Place 1 tablet (0.4 mg total) under the tongue every 5 (five) minutes x 3 doses as needed for chest pain.   Current Facility-Administered Medications for the 11/29/19 encounter (Office Visit) with Belva Crome, MD  Medication  . omalizumab Arvid Right) injection 300 mg     Allergies:   Patient has no known allergies.   Social History  Socioeconomic History  . Marital status: Married    Spouse name: Not on file  . Number of children: Not on file  . Years of education: Not on file  . Highest education level: Not on file  Occupational History  . Not on file  Tobacco Use  . Smoking status: Never Smoker  . Smokeless tobacco: Never Used  Substance and Sexual Activity  . Alcohol use: Yes    Alcohol/week: 3.0 standard drinks    Types: 3 Glasses of wine per week    Comment: occassional ETOH  . Drug use: No  . Sexual activity: Not on file  Other Topics Concern  . Not on file    Social History Narrative  . Not on file   Social Determinants of Health   Financial Resource Strain:   . Difficulty of Paying Living Expenses:   Food Insecurity:   . Worried About Charity fundraiser in the Last Year:   . Arboriculturist in the Last Year:   Transportation Needs:   . Film/video editor (Medical):   Marland Kitchen Lack of Transportation (Non-Medical):   Physical Activity:   . Days of Exercise per Week:   . Minutes of Exercise per Session:   Stress:   . Feeling of Stress :   Social Connections:   . Frequency of Communication with Friends and Family:   . Frequency of Social Gatherings with Friends and Family:   . Attends Religious Services:   . Active Member of Clubs or Organizations:   . Attends Archivist Meetings:   Marland Kitchen Marital Status:      Family History: The patient's family history includes Colon cancer in his mother; Diabetes in his father; Diverticulosis in his sister; Heart attack in his father; Heart disease in his father and maternal aunt; Hypertension in his father.  ROS:   Please see the history of present illness.    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  He is able to do most of what he wants to do without difficulty.  He had blood work done at the [place] last week.  Labs were covered.  Blood sugar was normal.  Hemoglobin A1c was not performed.  LDL cholesterol was 75.  All other systems reviewed and are negative.  EKGs/Labs/Other Studies Reviewed:    The following studies were reviewed today: No new data  EKG:  EKG reveals sinus rhythm at 61 bpm, old inferior infarct, otherwise normal.  When compared to March 2020, no change has occurred.  Recent Labs: No results found for requested labs within last 8760 hours.  Recent Lipid Panel    Component Value Date/Time   CHOL 120 09/05/2014 1029   TRIG 80.0 09/05/2014 1029   HDL 32.70 (L) 09/05/2014 1029   CHOLHDL 4  09/05/2014 1029   VLDL 16.0 09/05/2014 1029   LDLCALC 71 09/05/2014 1029    Physical Exam:    VS:  BP 132/82   Pulse 69   Ht 5\' 9"  (1.753 m)   Wt 179 lb 3.2 oz (81.3 kg)   SpO2 96%   BMI 26.46 kg/m     Wt Readings from Last 3 Encounters:  11/29/19 179 lb 3.2 oz (81.3 kg)  11/11/18 178 lb 9.6 oz (81 kg)  08/07/17 168 lb 3.2 oz (76.3 kg)     GEN: Slender and age-appropriate in appearance. No acute distress HEENT: Normal  NECK: No JVD. LYMPHATICS: No lymphadenopathy CARDIAC:  RRR without murmur, gallop, or edema. VASCULAR:  Normal Pulses. No bruits. RESPIRATORY:  Clear to auscultation without rales, wheezing or rhonchi  ABDOMEN: Soft, non-tender, non-distended, No pulsatile mass, MUSCULOSKELETAL: No deformity  SKIN: Warm and dry NEUROLOGIC:  Alert and oriented x 3 PSYCHIATRIC:  Normal affect   ASSESSMENT:    1. Coronary artery disease involving native coronary artery of native heart without angina pectoris   2. Ischemic cardiomyopathy   3. Other hyperlipidemia   4. Educated about COVID-19 virus infection    PLAN:    In order of problems listed above:  1. Secondary prevention discussed 2. No symptoms to suggest CHF.  Physically active without limitations.  Consider echo at next visit or having it performed at the [facility] to look for residual LV dysfunction. 3. Target LDL less than 70.  Continue current atorvastatin 20 mg/day dose.  He is otherwise not on any cardioactive medications. 4. Reluctant to get the COVID-19 vaccine, and if he gets it he wants to get [employer].  He is practicing social distancing.  Overall education and awareness concerning secondary risk prevention was discussed in detail: LDL less than 70, hemoglobin A1c less than 7, blood pressure target less than 130/80 mmHg, >150 minutes of moderate aerobic activity per week, avoidance of smoking, weight control (via diet and exercise), and continued surveillance/management of/for  obstructive sleep apnea.    Medication Adjustments/Labs and Tests Ordered: Current medicines are reviewed at length with the patient today.  Concerns regarding medicines are outlined above.  Orders Placed This Encounter  Procedures  . EKG 12-Lead   No orders of the defined types were placed in this encounter.   Patient Instructions  Medication Instructions:  Your physician recommends that you continue on your current medications as directed. Please refer to the Current Medication list given to you today.  *If you need a refill on your cardiac medications before your next appointment, please call your pharmacy*   Lab Work: None If you have labs (blood work) drawn today and your tests are completely normal, you will receive your results only by: Marland Kitchen MyChart Message (if you have MyChart) OR . A paper copy in the mail If you have any lab test that is abnormal or we need to change your treatment, we will call you to review the results.   Testing/Procedures: None   Follow-Up: At [facility], you and your health needs are our priority.  As part of our continuing mission to provide you with exceptional heart care, we have created designated Provider Care Teams.  These Care Teams include your primary Cardiologist (physician) and Advanced Practice Providers (APPs -  Physician Assistants and Nurse Practitioners) who all work together to provide you with the care you need, when you need it.  We recommend signing up for the patient portal called "MyChart".  Sign up information is provided on this After Visit Summary.  MyChart is used to connect with patients for Virtual Visits (Telemedicine).  Patients are able to view lab/test results, encounter notes, upcoming appointments, etc.  Non-urgent messages can be sent to your provider as well.   To learn more about what you can do with MyChart, go to [email or web address].    Your next appointment:   12 month(s)  The format for your next  appointment:   In Person  Provider:   You may see Sinclair Grooms, MD or one of the following Advanced Practice Providers  on your designated Care Team:    Truitt Merle, NP  Cecilie Kicks, NP  Kathyrn Drown, NP    Other Instructions      Signed, Sinclair Grooms, MD  11/29/2019 9:53 AM    [place]

## 2019-11-29 ENCOUNTER — Other Ambulatory Visit: Payer: Self-pay

## 2019-11-29 ENCOUNTER — Encounter: Payer: Self-pay | Admitting: Interventional Cardiology

## 2019-11-29 ENCOUNTER — Ambulatory Visit (INDEPENDENT_AMBULATORY_CARE_PROVIDER_SITE_OTHER): Payer: Medicare PPO | Admitting: Interventional Cardiology

## 2019-11-29 VITALS — BP 132/82 | HR 69 | Ht 69.0 in | Wt 179.2 lb

## 2019-11-29 DIAGNOSIS — E7849 Other hyperlipidemia: Secondary | ICD-10-CM | POA: Diagnosis not present

## 2019-11-29 DIAGNOSIS — Z7189 Other specified counseling: Secondary | ICD-10-CM | POA: Diagnosis not present

## 2019-11-29 DIAGNOSIS — I2510 Atherosclerotic heart disease of native coronary artery without angina pectoris: Secondary | ICD-10-CM

## 2019-11-29 DIAGNOSIS — I255 Ischemic cardiomyopathy: Secondary | ICD-10-CM

## 2019-11-29 NOTE — Patient Instructions (Signed)

## 2019-12-17 ENCOUNTER — Ambulatory Visit (INDEPENDENT_AMBULATORY_CARE_PROVIDER_SITE_OTHER): Payer: Medicare PPO

## 2019-12-17 ENCOUNTER — Other Ambulatory Visit: Payer: Self-pay

## 2019-12-17 DIAGNOSIS — L501 Idiopathic urticaria: Secondary | ICD-10-CM

## 2020-01-14 ENCOUNTER — Ambulatory Visit (INDEPENDENT_AMBULATORY_CARE_PROVIDER_SITE_OTHER): Payer: Medicare PPO

## 2020-01-14 DIAGNOSIS — L501 Idiopathic urticaria: Secondary | ICD-10-CM

## 2020-01-21 ENCOUNTER — Other Ambulatory Visit: Payer: Self-pay | Admitting: Interventional Cardiology

## 2020-02-11 ENCOUNTER — Other Ambulatory Visit: Payer: Self-pay

## 2020-02-11 ENCOUNTER — Ambulatory Visit (INDEPENDENT_AMBULATORY_CARE_PROVIDER_SITE_OTHER): Payer: Medicare PPO

## 2020-02-11 DIAGNOSIS — L501 Idiopathic urticaria: Secondary | ICD-10-CM | POA: Diagnosis not present

## 2020-02-22 ENCOUNTER — Ambulatory Visit: Payer: Medicare Other | Admitting: Urology

## 2020-03-07 ENCOUNTER — Ambulatory Visit: Payer: Medicare PPO | Admitting: Urology

## 2020-03-24 ENCOUNTER — Ambulatory Visit: Payer: Self-pay

## 2020-03-31 ENCOUNTER — Other Ambulatory Visit: Payer: Self-pay

## 2020-03-31 ENCOUNTER — Ambulatory Visit (INDEPENDENT_AMBULATORY_CARE_PROVIDER_SITE_OTHER): Payer: Medicare PPO

## 2020-03-31 DIAGNOSIS — L501 Idiopathic urticaria: Secondary | ICD-10-CM

## 2020-04-12 ENCOUNTER — Other Ambulatory Visit: Payer: Self-pay

## 2020-04-12 MED ORDER — ATORVASTATIN CALCIUM 20 MG PO TABS: 20.0000 mg | ORAL_TABLET | Freq: Every day | ORAL | 2 refills | Status: DC

## 2020-04-26 ENCOUNTER — Other Ambulatory Visit: Payer: Self-pay

## 2020-04-26 ENCOUNTER — Ambulatory Visit (INDEPENDENT_AMBULATORY_CARE_PROVIDER_SITE_OTHER): Payer: Medicare PPO

## 2020-04-26 DIAGNOSIS — L501 Idiopathic urticaria: Secondary | ICD-10-CM | POA: Diagnosis not present

## 2020-04-28 ENCOUNTER — Other Ambulatory Visit: Payer: Medicare PPO

## 2020-04-28 ENCOUNTER — Ambulatory Visit: Payer: Self-pay

## 2020-05-01 NOTE — Addendum Note (Signed)
Addended by: Iris Pert on: 05/01/2020 12:20 PM   Modules accepted: Orders

## 2020-05-02 ENCOUNTER — Ambulatory Visit (INDEPENDENT_AMBULATORY_CARE_PROVIDER_SITE_OTHER): Payer: Medicare PPO | Admitting: Urology

## 2020-05-02 ENCOUNTER — Encounter: Payer: Self-pay | Admitting: Urology

## 2020-05-02 ENCOUNTER — Other Ambulatory Visit: Payer: Self-pay

## 2020-05-02 VITALS — BP 133/80 | HR 60 | Temp 98.1°F | Ht 69.0 in | Wt 179.2 lb

## 2020-05-02 DIAGNOSIS — R972 Elevated prostate specific antigen [PSA]: Secondary | ICD-10-CM

## 2020-05-02 LAB — URINALYSIS, ROUTINE W REFLEX MICROSCOPIC
Bilirubin, UA: NEGATIVE
Glucose, UA: NEGATIVE
Leukocytes,UA: NEGATIVE
Nitrite, UA: NEGATIVE
Protein,UA: NEGATIVE
Specific Gravity, UA: 1.025 (ref 1.005–1.030)
Urobilinogen, Ur: 0.2 mg/dL (ref 0.2–1.0)
pH, UA: 5.5 (ref 5.0–7.5)

## 2020-05-02 LAB — MICROSCOPIC EXAMINATION
Bacteria, UA: NONE SEEN
Epithelial Cells (non renal): NONE SEEN /hpf (ref 0–10)
RBC, Urine: >30 /hpf — AB (ref 0–2)
Renal Epithel, UA: NONE SEEN /hpf
WBC, UA: NONE SEEN /hpf (ref 0–5)

## 2020-05-02 NOTE — Progress Notes (Signed)
H&P  Chief Complaint: Elevated PSA  History of Present Illness:   8.31.2021: Pt here for f/u and reports that he is doing well. Pt continues on alfuzosin to manage urinary symptoms and denies any recent gross hematuria or UTIs.  (below copied from [facility] records):   The last PSA value was 7.4.   12.08.2020: His presenting PSA, checked on 6.17.2014 (prior to his first visit) was 5.2. By history is PSA 2-3 years prior was 1.8, and his PSA in 2013 was 2.5.  His PSA was rechecked in August, 2014 and was 3.78. It was rechecked in November/2014 and was stable at 3.84. He was felt to have a moderately enlarged prostate, judged to be about 60 g by DRE, at the time of his first visit.   11.3.2020: PSA 7.4   12.8.2020: TRUS/Bx. prostate volume 52 mL.  1/12 cores--right apex medial--revealed small focus of atypia.  PSA:  11.03.2020: 7.4 10.15.2019: 4.8 10.09.2018: 4.6    Past Medical History:  Diagnosis Date  . Asthma   . CAD (coronary artery disease)    a. 01/2014 Inf STEMI/PCI: LM nl, LAD 50p/m, LCX 30-15m, OM1 50, OM2/3/4 nl, RCA (anomalous) 100p (3.5x23 Xience Alpine DES), EF 60;   . 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 HTN (hypertension)   . Ischemic cardiomyopathy    ab. 01/2014 Echo: EF 45-50%, inferoseptal/inf sev HK, Gr 1 DD, nl valves.  . Urticaria     Past Surgical History:  Procedure Laterality Date  . CORONARY STENT PLACEMENT  02/25/14  . LEFT HEART CATHETERIZATION WITH CORONARY ANGIOGRAM N/A 02/25/2014   Procedure: LEFT HEART CATHETERIZATION WITH CORONARY ANGIOGRAM;  Surgeon: Sinclair Grooms, MD;  Location: [facility] CATH LAB;  Service: Cardiovascular;  Laterality: N/A;  . PERCUTANEOUS CORONARY STENT INTERVENTION (PCI-S)  02/25/2014   Procedure: PERCUTANEOUS CORONARY STENT INTERVENTION (PCI-S);  Surgeon: Sinclair Grooms, MD;  Location: [facility] CATH LAB;  Service: Cardiovascular;;    Home Medications:  Allergies as of 05/02/2020   No Known Allergies      Medication List       Accurate as of May 02, 2020  9:32 AM. If you have any questions, ask your nurse or doctor.        albuterol 108 (90 Base) MCG/ACT inhaler Commonly known as: VENTOLIN HFA Inhale 2 puffs into the lungs every 6 (six) hours as needed for wheezing or shortness of breath.   albuterol (2.5 MG/3ML) 0.083% nebulizer solution Commonly known as: PROVENTIL Use one vial in nebulizer every four to six hours as needed for cough or wheeze.   alfuzosin 10 MG 24 hr tablet Commonly known as: UROXATRAL Take 10 mg by mouth daily.   aspirin EC 81 MG tablet Take 1 tablet (81 mg total) by mouth daily.   atorvastatin 20 MG tablet Commonly known as: LIPITOR Take 1 tablet (20 mg total) by mouth daily.   budesonide 180 MCG/ACT inhaler Commonly known as: PULMICORT Inhale two doses twice daily during flare-up as directed. Rinse, gargle, and spit after use.   buPROPion 150 MG 24 hr tablet Commonly known as: WELLBUTRIN XL Take 150 mg by mouth daily.   EPINEPHrine 0.3 mg/0.3 mL Soaj injection Commonly known as: EPI-PEN Use for life-threatening allergic reactions   fluticasone 50 MCG/ACT nasal spray Commonly known as: Flonase Use 1-2 sprays in each nostril as needed   nitroGLYCERIN 0.4 MG SL tablet Commonly known as: NITROSTAT Place 1 tablet (0.4 mg total) under the  tongue every 5 (five) minutes x 3 doses as needed for chest pain.       Allergies: No Known Allergies  Family History  Problem Relation Age of Onset  . Heart attack Father   . Hypertension Father   . Heart disease Father   . Diabetes Father   . Colon cancer Mother   . Diverticulosis Sister   . Heart disease Maternal Aunt     Social History:  reports that he has never smoked. He has never used smokeless tobacco. He reports current alcohol use of about 3.0 standard drinks of alcohol per week. He reports that he does not use drugs.  ROS: Urological Symptom Review  Patient is experiencing the  following symptoms: none   Review of Systems  Gastrointestinal (upper)  : Negative for upper GI symptoms  Gastrointestinal (lower) : Negative for lower GI symptoms  Constitutional : Negative for symptoms  Skin: Negative for skin symptoms  Eyes: Negative for eye symptoms  Ear/Nose/Throat : Negative for Ear/Nose/Throat symptoms  Hematologic/Lymphatic: Negative for Hematologic/Lymphatic symptoms  Cardiovascular : Negative for cardiovascular symptoms  Respiratory : Negative for respiratory symptoms  Endocrine: Negative for endocrine symptoms  Musculoskeletal: Joint pain  Neurological: Negative for neurological symptoms  Psychologic: Negative for psychiatric symptoms   Physical Exam:  Vital signs in last 24 hours: BP 133/80   Pulse 60   Temp 98.1 F (36.7 C)   Ht 5\' 9"  (1.753 m)   Wt 179 lb 3.2 oz (81.3 kg)   BMI 26.46 kg/m  Constitutional:  Alert and oriented, No acute distress Cardiovascular: Regular rate  Respiratory: Normal respiratory effort GI: Abdomen is soft, nontender, nondistended, no abdominal masses. No CVAT. No Hernias. Genitourinary: Normal male phallus, testes are descended bilaterally and non-tender and without masses, scrotum is normal in appearance without lesions or masses, perineum is normal on inspection. Prostate feels about 60 grams. Neurologic: Grossly intact, no focal deficits Psychiatric: Normal mood and affect  I have reviewed prior pt notes  I have reviewed notes from referring/previous physicians  I have reviewed urinalysis results  I have independently reviewed prior imaging  I have reviewed prior PSA/pathology results   Impression/Assessment:  Elevated PSA--7.4 in late 2020 with subsequent biopsy revealing a small focus of atypia in 1 core.  He has not had PSA drawn since that time.  Plan:  1. Pt advised and reassured regarding elevated PSA,  and surveillance protocol.  2. Pt to have PSA drawn today and will  f/u with phone call to discuss results. If significantly elevated, pt will be scheduled for next available MRI.  3. F/U in one year depending on PSA results.  CC: Dr. Pleas Koch

## 2020-05-02 NOTE — Progress Notes (Signed)
Urological Symptom Review  Patient is experiencing the following symptoms: none   Review of Systems  Gastrointestinal (upper)  : Negative for upper GI symptoms  Gastrointestinal (lower) : Negative for lower GI symptoms  Constitutional : Negative for symptoms  Skin: Negative for skin symptoms  Eyes: Negative for eye symptoms  Ear/Nose/Throat : Negative for Ear/Nose/Throat symptoms  Hematologic/Lymphatic: Negative for Hematologic/Lymphatic symptoms  Cardiovascular : Negative for cardiovascular symptoms  Respiratory : Negative for respiratory symptoms  Endocrine: Negative for endocrine symptoms  Musculoskeletal: Joint pain  Neurological: Negative for neurological symptoms  Psychologic: Negative for psychiatric symptoms

## 2020-05-03 LAB — PSA: Prostate Specific Ag, Serum: 3.5 ng/mL (ref 0.0–4.0)

## 2020-05-04 NOTE — Progress Notes (Signed)
LMTRC

## 2020-05-04 NOTE — Progress Notes (Signed)
Letter sent.

## 2020-05-24 ENCOUNTER — Other Ambulatory Visit: Payer: Self-pay

## 2020-05-24 ENCOUNTER — Ambulatory Visit (INDEPENDENT_AMBULATORY_CARE_PROVIDER_SITE_OTHER): Payer: Medicare PPO

## 2020-05-24 DIAGNOSIS — L501 Idiopathic urticaria: Secondary | ICD-10-CM

## 2020-06-05 ENCOUNTER — Other Ambulatory Visit: Payer: Self-pay | Admitting: *Deleted

## 2020-06-05 MED ORDER — XOLAIR 150 MG ~~LOC~~ SOLR: 300.0000 mg | SUBCUTANEOUS | 11 refills | Status: DC

## 2020-06-21 ENCOUNTER — Other Ambulatory Visit: Payer: Self-pay

## 2020-06-21 ENCOUNTER — Ambulatory Visit (INDEPENDENT_AMBULATORY_CARE_PROVIDER_SITE_OTHER): Payer: Medicare PPO

## 2020-06-21 ENCOUNTER — Ambulatory Visit: Payer: Self-pay

## 2020-06-21 DIAGNOSIS — L501 Idiopathic urticaria: Secondary | ICD-10-CM | POA: Diagnosis not present

## 2020-06-23 ENCOUNTER — Telehealth: Payer: Self-pay | Admitting: *Deleted

## 2020-06-23 NOTE — Telephone Encounter (Signed)
   [facility] Pre-operative Risk Assessment    HEARTCARE STAFF: - Please ensure there is not already an duplicate clearance open for this procedure. - Under Visit Info/Reason for Call, type in Other and utilize the format Clearance MM/DD/YY or Clearance TBD. Do not use dashes or single digits. - If request is for dental extraction, please clarify the # of teeth to be extracted.  Request for surgical clearance:  1. What type of surgery is being performed? LEFT ANKLE GANGLION CYST EXCISION, DEBRIDEMENT OF PERONEAL TENDONS   2. When is this surgery scheduled? 07/25/20   3. What type of clearance is required (medical clearance vs. Pharmacy clearance to hold med vs. Both)? MEDICAL  4. Are there any medications that need to be held prior to surgery and how long? ASA    5. Practice name and name of physician performing surgery? [facility]; DR. Gerald Stabs ADAIR   6. What is the office phone number? [phone]   7.   What is the office fax number? [phone]  8.   Anesthesia type (None, local, MAC, general) ? CHOICE   Julaine Hua 06/23/2020, 11:43 AM  _________________________________________________________________   (provider comments below)

## 2020-06-26 NOTE — Telephone Encounter (Signed)
Left message to call back and ask to speak with pre-op team. 

## 2020-06-26 NOTE — Telephone Encounter (Signed)
Hi Dr. Tamala Julian,   Patient is scheduled for a left ankle ganglion cyst excision/debridement of peroneal tendon on 07/25/2020 and is being asked to hold Aspirin. He has history of CAD with MI in 2015 s/p DES to RCA. 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 Is he OK to hold Aspirin?  Please route back to P CV DIV PREOP.  Thank you! Rosaura Bolon

## 2020-06-26 NOTE — Telephone Encounter (Signed)
Okay to hold aspirin. 

## 2020-06-28 NOTE — Telephone Encounter (Signed)
LVM

## 2020-07-04 ENCOUNTER — Ambulatory Visit: Payer: Medicare Other | Admitting: Urology

## 2020-07-04 ENCOUNTER — Ambulatory Visit: Payer: Medicare PPO | Admitting: Urology

## 2020-07-04 NOTE — Telephone Encounter (Signed)
I called his phone number twice, both times the phone call went straight into voicemail. I left a message for him to call us back and speak to the on call preop APP.

## 2020-07-11 NOTE — Telephone Encounter (Signed)
   Primary Cardiologist: Sinclair Grooms, MD  Chart reviewed as part of pre-operative protocol coverage.   Left a voicemail for patient to call back for ongoing preop assessment.   [name], PA-C 07/11/2020, 8:20 AM

## 2020-07-17 NOTE — Telephone Encounter (Signed)
Will send FYI to requesting office that we have tried a number of times to reach pt in regards to pre op. I will remove from the pre op call back pool.

## 2020-07-17 NOTE — Telephone Encounter (Addendum)
Unable to provide medical clearance as unable to reach the patient on multiple attempts. Will remove pt from pre-op. Will fax this pharmacy clearance to requesting provider.   Leanor Kail, PA-C

## 2020-07-18 ENCOUNTER — Telehealth: Payer: Self-pay | Admitting: Interventional Cardiology

## 2020-07-18 NOTE — Telephone Encounter (Signed)
Called and left voice mail

## 2020-07-18 NOTE — Telephone Encounter (Signed)
Patient is calling for medical clearance. Says that foot doctor has already fax over clearance to be filled out. Please call

## 2020-07-19 ENCOUNTER — Ambulatory Visit: Payer: Self-pay

## 2020-07-19 ENCOUNTER — Telehealth: Payer: Self-pay | Admitting: Physician Assistant

## 2020-07-19 NOTE — Telephone Encounter (Signed)
   Primary Cardiologist: Sinclair Grooms, MD  Chart reviewed as part of pre-operative protocol coverage. finally able to reach the patient after multiple attempts. He just played 18 hole golf. Given past medical history and time since last visit, based on ACC/AHA guidelines, Eric Cortez would be at acceptable risk for the planned procedure without further cardiovascular testing.   The patient was advised that if he develops new symptoms prior to surgery to contact our office to arrange for a follow-up visit, and he verbalized understanding.  I will route this recommendation to the requesting party via Epic fax function and remove from pre-op pool.  Please call with questions.  [place], [place] 07/19/2020, 4:46 PM

## 2020-07-19 NOTE — Telephone Encounter (Signed)
Patient called in after phone call was disconnected while speaking with Vin. Let patient know he would call him back when available.

## 2020-07-19 NOTE — Telephone Encounter (Signed)
Unable to reach again. Will remove from pool.

## 2020-07-19 NOTE — Telephone Encounter (Signed)
Pt returned call but suddenly phone got cut off. I tried to reach pt multiple times but was not success. I can not cleared the patient based on few second of conversations.

## 2020-08-04 ENCOUNTER — Ambulatory Visit (INDEPENDENT_AMBULATORY_CARE_PROVIDER_SITE_OTHER): Payer: Medicare PPO

## 2020-08-04 DIAGNOSIS — L501 Idiopathic urticaria: Secondary | ICD-10-CM | POA: Diagnosis not present

## 2020-09-08 ENCOUNTER — Ambulatory Visit (INDEPENDENT_AMBULATORY_CARE_PROVIDER_SITE_OTHER): Payer: Medicare PPO

## 2020-09-08 DIAGNOSIS — L501 Idiopathic urticaria: Secondary | ICD-10-CM | POA: Diagnosis not present

## 2020-09-18 ENCOUNTER — Other Ambulatory Visit: Payer: Self-pay | Admitting: *Deleted

## 2020-09-18 MED ORDER — OMALIZUMAB 150 MG/ML ~~LOC~~ SOSY: 300.0000 mg | PREFILLED_SYRINGE | SUBCUTANEOUS | 11 refills | Status: DC

## 2020-10-06 ENCOUNTER — Ambulatory Visit (INDEPENDENT_AMBULATORY_CARE_PROVIDER_SITE_OTHER): Payer: Medicare PPO

## 2020-10-06 ENCOUNTER — Other Ambulatory Visit: Payer: Self-pay

## 2020-10-06 DIAGNOSIS — L501 Idiopathic urticaria: Secondary | ICD-10-CM | POA: Diagnosis not present

## 2020-10-10 ENCOUNTER — Telehealth: Payer: Self-pay | Admitting: *Deleted

## 2020-10-10 NOTE — Telephone Encounter (Signed)
Patient called and advised that he now live 2 hours away and wants his wife a retired NP to administer his injections at home. Called [employer] and talked to them to change to ship to home and number given to patient to reorder. Patient has epipen and storage and dosing instrux given

## 2020-10-18 ENCOUNTER — Other Ambulatory Visit: Payer: Self-pay

## 2020-10-18 DIAGNOSIS — N400 Enlarged prostate without lower urinary tract symptoms: Secondary | ICD-10-CM

## 2020-10-18 MED ORDER — ALFUZOSIN HCL ER 10 MG PO TB24: 10.0000 mg | ORAL_TABLET | Freq: Every day | ORAL | 6 refills | Status: DC

## 2020-11-03 ENCOUNTER — Ambulatory Visit: Payer: Self-pay

## 2020-12-18 NOTE — Progress Notes (Signed)
Cardiology Office Note    Date:  12/26/2020   ID:  Eric Cortez, DOB 09/30/1950, MRN [id number]   PCP:  Curlene Labrum, MD   [place]  Cardiologist:  Sinclair Grooms, MD  Advanced Practice Provider:  No care team member to display Electrophysiologist:  None   [id number]   Chief Complaint  Patient presents with  . Follow-up    History of Present Illness:  Eric Cortez is a 70 y.o. male with history of CAD status post inferior STEMI treated with DES to the RCA, ischemic cardiomyopathy EF 45 to 50% 2015, hypertension.  Last saw Dr. Tamala Julian 11/29/2019 and was doing well.  Patient comes in for f/u. Having some back trouble-compressed discs and has ortho appt today. Denies chest pain, shortness of breath, edema. 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 Had blood work at the [place] 2 weeks ago but doesn't have results yet.    Past Medical History:  Diagnosis Date  . Asthma   . CAD (coronary artery disease)    a. 01/2014 Inf STEMI/PCI: LM nl, LAD 50p/m, LCX 30-54m, OM1 50, OM2/3/4 nl, RCA (anomalous) 100p (3.5x23 Xience Alpine DES), EF 60;   . 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 HTN (hypertension)   . Ischemic cardiomyopathy    ab. 01/2014 Echo: EF 45-50%, inferoseptal/inf sev HK, Gr 1 DD, nl valves.  . Urticaria     Past Surgical History:  Procedure Laterality Date  . CORONARY STENT PLACEMENT  02/25/14  . LEFT HEART CATHETERIZATION WITH CORONARY ANGIOGRAM N/A 02/25/2014   Procedure: LEFT HEART CATHETERIZATION WITH CORONARY ANGIOGRAM;  Surgeon: Sinclair Grooms, MD;  Location: [facility] CATH LAB;  Service: Cardiovascular;  Laterality: N/A;  . PERCUTANEOUS CORONARY STENT INTERVENTION (PCI-S)  02/25/2014   Procedure: PERCUTANEOUS CORONARY STENT INTERVENTION (PCI-S);  Surgeon: Sinclair Grooms, MD;  Location: [facility] CATH LAB;  Service: Cardiovascular;;    Current Medications: Current Meds   Medication Sig  . albuterol (PROVENTIL) (2.5 MG/3ML) 0.083% nebulizer solution Use one vial in nebulizer every four to six hours as needed for cough or wheeze.  Marland Kitchen albuterol (VENTOLIN HFA) 108 (90 Base) MCG/ACT inhaler Inhale 2 puffs into the lungs every 6 (six) hours as needed for wheezing or shortness of breath.  . alfuzosin (UROXATRAL) 10 MG 24 hr tablet Take 1 tablet (10 mg total) by mouth daily.  Marland Kitchen aspirin EC 81 MG tablet Take 1 tablet (81 mg total) by mouth daily.  Marland Kitchen atorvastatin (LIPITOR) 20 MG tablet Take 1 tablet (20 mg total) by mouth daily.  . budesonide (PULMICORT) 180 MCG/ACT inhaler Inhale two doses twice daily during flare-up as directed. Rinse, gargle, and spit after use.  Marland Kitchen buPROPion (WELLBUTRIN XL) 150 MG 24 hr tablet Take 150 mg by mouth daily.  Marland Kitchen EPINEPHrine 0.3 mg/0.3 mL IJ SOAJ injection Use for life-threatening allergic reactions  . fluticasone (FLONASE) 50 MCG/ACT nasal spray Use 1-2 sprays in each nostril as needed  . omalizumab Arvid Right) 150 MG/ML prefilled syringe Inject 300 mg into the skin every 28 (twenty-eight) days.  . [DISCONTINUED] nitroGLYCERIN (NITROSTAT) 0.4 MG SL tablet Place 1 tablet (0.4 mg total) under the tongue every 5 (five) minutes x 3 doses as needed for chest pain.   Current Facility-Administered Medications for the 12/26/20 encounter (Office Visit) with Imogene Burn, PA-C  Medication  . omalizumab Arvid Right) injection 300  mg     Allergies:   Patient has no known allergies.   Social History   Socioeconomic History  . Marital status: Married    Spouse name: Not on file  . Number of children: Not on file  . Years of education: Not on file  . Highest education level: Not on file  Occupational History  . Not on file  Tobacco Use  . Smoking status: Never Smoker  . Smokeless tobacco: Never Used  Vaping Use  . Vaping Use: Never used  Substance and Sexual Activity  . Alcohol use: Yes    Alcohol/week: 3.0 standard drinks    Types: 3 Glasses  of wine per week    Comment: occassional ETOH  . Drug use: No  . Sexual activity: Not on file  Other Topics Concern  . Not on file  Social History Narrative  . Not on file   Social Determinants of Health   Financial Resource Strain: Not on file  Food Insecurity: Not on file  Transportation Needs: Not on file  Physical Activity: Not on file  Stress: Not on file  Social Connections: Not on file     Family History:  The patient's family history includes Colon cancer in his mother; Diabetes in his father; Diverticulosis in his sister; Heart attack in his father; Heart disease in his father and maternal aunt; Hypertension in his father.   ROS:   Please see the history of present illness.    ROS All other systems reviewed and are negative.   PHYSICAL EXAM:   VS:  BP 130/88   Pulse 79   Ht 5\' 9"  (1.753 m)   Wt 174 lb 9.6 oz (79.2 kg)   SpO2 96%   BMI 25.78 kg/m   Physical Exam  GEN: Well nourished, well developed, in no acute distress  HEENT: normal  Neck: no JVD, carotid bruits, or masses Cardiac:RRR; no murmurs, rubs, or gallops  Respiratory:  clear to auscultation bilaterally, normal work of breathing GI: soft, nontender, nondistended, + BS Ext: without cyanosis, clubbing, or edema, Good distal pulses bilaterally MS: no deformity or atrophy  Skin: warm and dry, no rash Neuro:  Alert and Oriented x 3, Strength and sensation are intact Psych: euthymic mood, full affect  Wt Readings from Last 3 Encounters:  12/26/20 174 lb 9.6 oz (79.2 kg)  05/02/20 179 lb 3.2 oz (81.3 kg)  11/29/19 179 lb 3.2 oz (81.3 kg)      Studies/Labs Reviewed:   EKG:  EKG is  ordered today.  The ekg ordered today demonstrates NSR with old inf MI no acute change from prior tracings.  Recent Labs: No results found for requested labs within last 8760 hours.   Lipid Panel    Component Value Date/Time   CHOL 120 09/05/2014 1029   TRIG 80.0 09/05/2014 1029   HDL 32.70 (L) 09/05/2014 1029    CHOLHDL 4 09/05/2014 1029   VLDL 16.0 09/05/2014 1029   LDLCALC 71 09/05/2014 1029    Additional studies/ records that were reviewed today include:  2D echo 02/25/2014 Study Conclusions   - Left ventricle: The cavity size was normal. Wall thickness was    normal. Systolic function was mildly reduced. The estimated    ejection fraction was in the range of 45% to 50%. Inferoseptal    and inferior severe hypokinesis. Doppler parameters are    consistent with abnormal left ventricular relaxation (grade 1    diastolic dysfunction).  - Aortic valve: There was  no stenosis.  - Mitral valve: There was no significant regurgitation.  - Right ventricle: The cavity size was normal. Systolic function    was normal.  - Pulmonary arteries: No complete TR doppler jet so unable to    estimate PA systolic pressure.  - Inferior vena cava: The vessel was normal in size. The    respirophasic diameter changes were in the normal range (= 50%),    consistent with normal central venous pressure.   Impressions:   - Normal LV size with mildly decreased systolic function, EF    76-28%. Severe hypokinesis of the inferoseptal and inferior    walls. Normal RV size and systoilc function. No significant    valvular abnormalities.     Risk Assessment/Calculations:         ASSESSMENT:    1. Coronary artery disease involving native coronary artery of native heart without angina pectoris   2. Ischemic cardiomyopathy   3. Hyperlipidemia, unspecified hyperlipidemia type   4. Essential hypertension      PLAN:  In order of problems listed above:  CAD status post inferior STEMI in 2015 treated with DES to the RCA, low risk NST 2016, no angina  Ischemic cardiomyopathy ejection fraction 45 to 50% in 2015-no CHF symptoms.   HLD[facility]'ll ask labs to be sent.  HTN-BP stable at home (up a little today)  Shared Decision Making/Informed Consent        Medication  Adjustments/Labs and Tests Ordered: Current medicines are reviewed at length with the patient today.  Concerns regarding medicines are outlined above.  Medication changes, Labs and Tests ordered today are listed in the Patient Instructions below. Patient Instructions  Medication Instructions:  Your physician recommends that you continue on your current medications as directed. Please refer to the Current Medication list given to you today.  *If you need a refill on your cardiac medications before your next appointment, please call your pharmacy*   Lab Work: None ordered   If you have labs (blood work) drawn today and your tests are completely normal, you will receive your results only by: Marland Kitchen MyChart Message (if you have MyChart) OR . A paper copy in the mail If you have any lab test that is abnormal or we need to change your treatment, we will call you to review the results.   Testing/Procedures: None ordered    Follow-Up: At [facility], you and your health needs are our priority.  As part of our continuing mission to provide you with exceptional heart care, we have created designated Provider Care Teams.  These Care Teams include your primary Cardiologist (physician) and Advanced Practice Providers (APPs -  Physician Assistants and Nurse Practitioners) who all work together to provide you with the care you need, when you need it.  We recommend signing up for the patient portal called "MyChart".  Sign up information is provided on this After Visit Summary.  MyChart is used to connect with patients for Virtual Visits (Telemedicine).  Patients are able to view lab/test results, encounter notes, upcoming appointments, etc.  Non-urgent messages can be sent to your provider as well.   To learn more about what you can do with MyChart, go to [email or web address].    Your next appointment:   12 month(s)  The format for your next appointment:   In Person  Provider:   You may see Sinclair Grooms, MD or one of the following Advanced Practice Providers on  your designated Care Team:    Kathyrn Drown, NP    Other Instructions None      Signed, Ermalinda Barrios, PA-C  12/26/2020 11:24 AM    [facility] [address], [place], [place]  [address] Phone: [phone]; Fax: [phone]

## 2020-12-26 ENCOUNTER — Ambulatory Visit (INDEPENDENT_AMBULATORY_CARE_PROVIDER_SITE_OTHER): Payer: Medicare PPO | Admitting: Physician Assistant

## 2020-12-26 ENCOUNTER — Other Ambulatory Visit: Payer: Self-pay

## 2020-12-26 ENCOUNTER — Encounter: Payer: Self-pay | Admitting: Physician Assistant

## 2020-12-26 VITALS — BP 130/88 | HR 79 | Ht 69.0 in | Wt 174.6 lb

## 2020-12-26 DIAGNOSIS — I2510 Atherosclerotic heart disease of native coronary artery without angina pectoris: Secondary | ICD-10-CM | POA: Diagnosis not present

## 2020-12-26 DIAGNOSIS — E785 Hyperlipidemia, unspecified: Secondary | ICD-10-CM

## 2020-12-26 DIAGNOSIS — I10 Essential (primary) hypertension: Secondary | ICD-10-CM

## 2020-12-26 DIAGNOSIS — I255 Ischemic cardiomyopathy: Secondary | ICD-10-CM | POA: Diagnosis not present

## 2020-12-26 MED ORDER — NITROGLYCERIN 0.4 MG SL SUBL: 0.4000 mg | SUBLINGUAL_TABLET | SUBLINGUAL | 3 refills | Status: DC | PRN

## 2020-12-26 NOTE — Patient Instructions (Signed)
Medication Instructions:  Your physician recommends that you continue on your current medications as directed. Please refer to the Current Medication list given to you today.  *If you need a refill on your cardiac medications before your next appointment, please call your pharmacy*   Lab Work: None ordered   If you have labs (blood work) drawn today and your tests are completely normal, you will receive your results only by: Marland Kitchen MyChart Message (if you have MyChart) OR . A paper copy in the mail If you have any lab test that is abnormal or we need to change your treatment, we will call you to review the results.   Testing/Procedures: None ordered    Follow-Up: At [facility], you and your health needs are our priority.  As part of our continuing mission to provide you with exceptional heart care, we have created designated Provider Care Teams.  These Care Teams include your primary Cardiologist (physician) and Advanced Practice Providers (APPs -  Physician Assistants and Nurse Practitioners) who all work together to provide you with the care you need, when you need it.  We recommend signing up for the patient portal called "MyChart".  Sign up information is provided on this After Visit Summary.  MyChart is used to connect with patients for Virtual Visits (Telemedicine).  Patients are able to view lab/test results, encounter notes, upcoming appointments, etc.  Non-urgent messages can be sent to your provider as well.   To learn more about what you can do with MyChart, go to [email or web address].    Your next appointment:   12 month(s)  The format for your next appointment:   In Person  Provider:   You may see Sinclair Grooms, MD or one of the following Advanced Practice Providers on your designated Care Team:    Kathyrn Drown, NP    Other Instructions None

## 2020-12-27 ENCOUNTER — Ambulatory Visit: Payer: Medicare PPO | Admitting: Interventional Cardiology

## 2020-12-29 ENCOUNTER — Other Ambulatory Visit: Payer: Self-pay | Admitting: Student

## 2020-12-29 ENCOUNTER — Telehealth: Payer: Self-pay

## 2020-12-29 DIAGNOSIS — M48062 Spinal stenosis, lumbar region with neurogenic claudication: Secondary | ICD-10-CM

## 2020-12-29 NOTE — Progress Notes (Signed)
Phone call to patient to verify medication list and allergies for myelogram procedure. Pt instructed to hold Wellbutrin for 48hrs prior to myelogram appointment time and 24 hours after appointment. Pt also instructed to have a driver the day of the procedure, the procedure would take around 2 hours, and discharge instructions discussed. Pt verbalized understanding.   

## 2021-01-10 ENCOUNTER — Other Ambulatory Visit: Payer: Medicare PPO

## 2021-01-18 ENCOUNTER — Ambulatory Visit
Admission: RE | Admit: 2021-01-18 | Discharge: 2021-01-18 | Disposition: A | Discharge status: Home / Self Care | Payer: Medicare PPO | Source: Ambulatory Visit | Attending: Student | Admitting: Student

## 2021-01-18 ENCOUNTER — Other Ambulatory Visit: Payer: Self-pay

## 2021-01-18 DIAGNOSIS — M48062 Spinal stenosis, lumbar region with neurogenic claudication: Secondary | ICD-10-CM

## 2021-01-18 MED ORDER — IOPAMIDOL (ISOVUE-M 200) INJECTION 41%
20.0000 mL | Freq: Once | INTRAMUSCULAR | Status: AC
  Administered 2021-01-18: 20 mL via INTRATHECAL

## 2021-01-18 MED ORDER — DIAZEPAM 5 MG PO TABS
5.0000 mg | ORAL_TABLET | Freq: Once | ORAL | Status: AC
  Administered 2021-01-18: 5 mg via ORAL

## 2021-01-18 NOTE — Discharge Instructions (Signed)
Myelogram Discharge Instructions  1. Go home and rest quietly as needed. You may resume normal activities; however, do not exert yourself strongly or do any heavy lifting today and tomorrow.   2. DO NOT drive today.    3. You may resume your normal diet and medications unless otherwise indicated. Drink lots of extra fluids today and tomorrow.   4. The incidence of headache, nausea, or vomiting is about 5% (one in 20 patients).  If you develop a headache, lie flat for 24 hours and drink plenty of fluids until the headache goes away.  Caffeinated beverages may be helpful. If when you get up you still have a headache when standing, go back to bed and force fluids for another 24 hours.   5. If you develop severe nausea and vomiting or a headache that does not go away with the flat bedrest after 48 hours, please call [phone].   6. Call your physician for a follow-up appointment.  The results of your myelogram will be sent directly to your physician by the following day.  7. If you have any questions or if complications develop after you arrive home, please call [phone].  Discharge instructions have been explained to the patient.  The patient, or the person responsible for the patient, fully understands these instructions.   Thank you for visiting our office today.   YOU MAY RESUME YOUR WELLBUTRIN TOMORROW 01/19/21 AT 10:30 AM OR AFTER

## 2021-01-18 NOTE — Progress Notes (Signed)
Pt reports he has been off of his Wellbutrin for at least 48 hours.

## 2021-02-01 ENCOUNTER — Ambulatory Visit (INDEPENDENT_AMBULATORY_CARE_PROVIDER_SITE_OTHER): Payer: Medicare PPO | Admitting: Physician Assistant

## 2021-02-01 ENCOUNTER — Other Ambulatory Visit: Payer: Self-pay

## 2021-02-01 ENCOUNTER — Encounter: Payer: Self-pay | Admitting: Physician Assistant

## 2021-02-01 DIAGNOSIS — L570 Actinic keratosis: Secondary | ICD-10-CM

## 2021-02-01 DIAGNOSIS — Z1283 Encounter for screening for malignant neoplasm of skin: Secondary | ICD-10-CM

## 2021-02-01 NOTE — Progress Notes (Signed)
   New Patient   Subjective  SAMULE LIFE is a 70 y.o. male who presents for the following: Annual Exam (Scalp crust per patient, h/o left chest moderate).   The following portions of the chart were reviewed this encounter and updated as appropriate:      Objective  Well appearing patient in no apparent distress; mood and affect are within normal limits.  A full examination was performed including scalp, head, eyes, ears, nose, lips, neck, chest, axillae, abdomen, back, bilateral upper extremities, hands, feet, fingers, toes, and fingernails. All findings within normal limits unless otherwise noted below.  Objective  Left Buccal Cheek, Left Forehead, Right Buccal Cheek: Erythematous patches with gritty scale.   Assessment & Plan  AK (actinic keratosis) (3) Left Buccal Cheek; Right Buccal Cheek; Left Forehead  Destruction of lesion - Left Buccal Cheek, Left Forehead, Right Buccal Cheek Complexity: simple   Destruction method: cryotherapy   Informed consent: discussed and consent obtained   Timeout:  patient name, date of birth, surgical site, and procedure verified Lesion destroyed using liquid nitrogen: Yes   Cryotherapy cycles:  3 Outcome: patient tolerated procedure well with no complications   Post-procedure details: wound care instructions given      I, Ambree Frances, PA-C, have reviewed all documentation for this visit. The documentation on 02/01/21 for the exam, diagnosis, procedures, and orders are all accurate and complete.

## 2021-04-04 ENCOUNTER — Other Ambulatory Visit: Payer: Self-pay | Admitting: Interventional Cardiology

## 2021-04-26 ENCOUNTER — Other Ambulatory Visit: Payer: Medicare PPO

## 2021-04-30 NOTE — Progress Notes (Signed)
History of Present Illness: Here for follow-up of elevated PSA.  12.08.2020: His presenting PSA, checked on 6.17.2014 (prior to his first visit) was 5.2. By history is PSA 2-3 years prior was 1.8, and his PSA in 2013 was 2.5.  His PSA was rechecked in August, 2014 and was 3.78. It was rechecked in November/2014 and was stable at 3.84. He was felt to have a moderately enlarged prostate, judged to be about 60 g by DRE, at the time of his first visit.   11.3.2020: PSA 7.4   12.8.2020: TRUS/Bx. prostate volume 52 mL.  1/12 cores--right apex medial--revealed small focus of atypia.   PSA:   10.09.2018: 4.6 10.15.2019: 4.8 11.03.2020: 7.4 8.31.2021: 3.5  8.30.2022: He has been doing well from GU standpoint.  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  IPSS Questionnaire (AUA-7): Over the past month.   1)  How often have you had a sensation of not emptying your bladder completely after you finish urinating?  1 - Less than 1 time in 5  2)  How often have you had to urinate again less than two hours after you finished urinating? 2 - Less than half the time  3)  How often have you found you stopped and started again several times when you urinated?  2 - Less than half the time  4) How difficult have you found it to postpone urination?  1 - Less than 1 time in 5  5) How often have you had a weak urinary stream?  2 - Less than half the time  6) How often have you had to push or strain to begin urination?  2 - Less than half the time  7) How many times did you most typically get up to urinate from the time you went to bed until the time you got up in the morning?  1 - 1 time  Total score:  0-7 mildly symptomatic   8-19 moderately symptomatic   20-35 severely symptomatic  QoL score 2  Past Medical History:  Diagnosis Date   Asthma    Atypical mole 2014   moderate left chest   CAD (coronary artery disease)    a. 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 01/2014 Echo: EF 45-50%, inferoseptal/inf sev HK, Gr 1 DD, nl valves.   Urticaria     Past Surgical History:  Procedure Laterality Date   CORONARY STENT PLACEMENT  02/25/14   LEFT HEART CATHETERIZATION WITH CORONARY ANGIOGRAM N/A 02/25/2014   Procedure: LEFT HEART CATHETERIZATION WITH CORONARY ANGIOGRAM;  Surgeon: HSinclair Grooms MD;  [facility];  Service: Cardiovascular;  Laterality: N/A;   PERCUTANEOUS CORONARY STENT INTERVENTION (PCI-S)  02/25/2014   Procedure: PERCUTANEOUS CORONARY STENT INTERVENTION (PCI-S);  Surgeon: HSinclair Grooms MD;  [facility];  Service: Cardiovascular;;    Home Medications:  Allergies as of 05/01/2021       Reactions   Ticagrelor    Pt reports "I do not know about this"        Medication List        Accurate as of April 30, 2021  7:53 PM. If you have any questions, ask  your nurse or doctor.          albuterol 108 (90 Base) MCG/ACT inhaler Commonly known as: VENTOLIN HFA Inhale 2 puffs into the lungs every 6 (six) hours as needed for wheezing or shortness of breath.   albuterol (2.5 MG/3ML) 0.083% nebulizer solution Commonly known as: PROVENTIL Use one vial in nebulizer every four to six hours as needed for cough or wheeze.   alfuzosin 10 MG 24 hr tablet Commonly known as: UROXATRAL Take 1 tablet (10 mg total) by mouth daily.   aspirin EC 81 MG tablet Take 1 tablet (81 mg total) by mouth daily.   atorvastatin 20 MG tablet Commonly known as: LIPITOR Take 1 tablet by mouth once daily   budesonide 180 MCG/ACT inhaler Commonly known as: PULMICORT Inhale two doses twice daily during flare-up as directed. Rinse, gargle, and spit after use.   buPROPion 150 MG 24 hr tablet Commonly known as: WELLBUTRIN  XL Take 150 mg by mouth daily.   EPINEPHrine 0.3 mg/0.3 mL Soaj injection Commonly known as: EPI-PEN Use for life-threatening allergic reactions   fluticasone 50 MCG/ACT nasal spray Commonly known as: Flonase Use 1-2 sprays in each nostril as needed   nitroGLYCERIN 0.4 MG SL tablet Commonly known as: NITROSTAT Place 1 tablet (0.4 mg total) under the tongue every 5 (five) minutes x 3 doses as needed for chest pain.   omalizumab 150 MG/ML prefilled syringe Commonly known as: Xolair Inject 300 mg into the skin every 28 (twenty-eight) days.        Allergies:  Allergies  Allergen Reactions   Ticagrelor     Pt reports "I do not know about this"    Family History  Problem Relation Age of Onset   Heart attack Father    Hypertension Father    Heart disease Father    Diabetes Father    Colon cancer Mother    Diverticulosis Sister    Heart disease Maternal Aunt     Social History:  reports that he has never smoked. He has never used smokeless tobacco. He reports current alcohol use of about 3.0 standard drinks per week. He reports that he does not use drugs.  ROS: A complete review of systems was performed.  All systems are negative except for pertinent findings as noted.  Physical Exam:  Vital signs in last 24 hours: There were no vitals taken for this visit. Constitutional:  Alert and oriented, No acute distress Cardiovascular: Regular rate  Respiratory: Normal respiratory effort GI: Abdomen is soft, nontender, nondistended, no abdominal masses. No CVAT.  Genitourinary: Normal male phallus, testes are descended bilaterally and non-tender and without masses, scrotum is normal in appearance without lesions or masses, perineum is normal on inspection.  His prostate is 60 g, symmetrical, nonnodular, nontender. Lymphatic: No lymphadenopathy Neurologic: Grossly intact, no focal deficits Psychiatric: Normal mood and affect  I have reviewed prior pt notes  I have reviewed  urinalysis results  I have reviewed prior PSA results--last year 3.5, lowest yet     Impression/Assessment:  1.  Elevated PSA with atypia on biopsy in 2020.  Stable/lower PSA, stable exam  2.  BPH with mild symptomatology on alfuzosin  Plan:  1.  His PSA is checked today  2.  I will see back in 1 year for recheck  3.  Continue alfuzosin

## 2021-05-01 ENCOUNTER — Encounter: Payer: Self-pay | Admitting: Urology

## 2021-05-01 ENCOUNTER — Ambulatory Visit (INDEPENDENT_AMBULATORY_CARE_PROVIDER_SITE_OTHER): Payer: Medicare PPO | Admitting: Urology

## 2021-05-01 ENCOUNTER — Other Ambulatory Visit: Payer: Medicare PPO

## 2021-05-01 ENCOUNTER — Other Ambulatory Visit: Payer: Self-pay

## 2021-05-01 VITALS — BP 145/81 | HR 70

## 2021-05-01 DIAGNOSIS — N400 Enlarged prostate without lower urinary tract symptoms: Secondary | ICD-10-CM

## 2021-05-01 DIAGNOSIS — R972 Elevated prostate specific antigen [PSA]: Secondary | ICD-10-CM

## 2021-05-01 LAB — URINALYSIS, ROUTINE W REFLEX MICROSCOPIC
Bilirubin, UA: NEGATIVE
Glucose, UA: NEGATIVE
Ketones, UA: NEGATIVE
Leukocytes,UA: NEGATIVE
Nitrite, UA: NEGATIVE
Protein,UA: NEGATIVE
RBC, UA: NEGATIVE
Specific Gravity, UA: <1.005 — ABNORMAL LOW (ref 1.005–1.030)
Urobilinogen, Ur: 0.2 mg/dL (ref 0.2–1.0)
pH, UA: 5.5 (ref 5.0–7.5)

## 2021-05-01 NOTE — Progress Notes (Signed)

## 2021-05-02 LAB — PSA: Prostate Specific Ag, Serum: 4.2 ng/mL — ABNORMAL HIGH (ref 0.0–4.0)

## 2021-05-03 ENCOUNTER — Telehealth: Payer: Self-pay

## 2021-05-03 NOTE — Telephone Encounter (Signed)
Patient called and notified of results.  Voiced understanding. 

## 2021-05-03 NOTE — Telephone Encounter (Signed)
-----   Message from Franchot Gallo, MD sent at 05/03/2021 12:22 PM EDT ----- Notify pt psa stable @ 4.2 ----- Message ----- From: Dorisann Frames, RN Sent: 05/02/2021   8:30 AM EDT To: Franchot Gallo, MD  Please review

## 2021-05-14 ENCOUNTER — Other Ambulatory Visit: Payer: Self-pay

## 2021-05-14 DIAGNOSIS — N400 Enlarged prostate without lower urinary tract symptoms: Secondary | ICD-10-CM

## 2021-05-14 MED ORDER — ALFUZOSIN HCL ER 10 MG PO TB24: 10.0000 mg | ORAL_TABLET | Freq: Every day | ORAL | 3 refills | Status: DC

## 2021-05-14 NOTE — Progress Notes (Signed)
Patient requested 90 day supply. New rx submitted

## 2021-06-11 ENCOUNTER — Other Ambulatory Visit: Payer: Self-pay | Admitting: Neurosurgery

## 2021-06-20 NOTE — Progress Notes (Signed)
Surgical Instructions    Your procedure is scheduled on Monday, October 24th.  Report to [facility] Main Entrance "A" at 8:10 A.M., then check in with the Admitting office.  Call this number if you have problems the morning of surgery:  [phone]   If you have any questions prior to your surgery date call [phone]: Open Monday-Friday 8am-4pm    Remember:  Do not eat or drink after midnight the night before your surgery    Take these medicines the morning of surgery with A SIP OF WATER Alfuzosin (Uroxatral) Atorvastatin (Lipitor) Bupropion (Wellbutrin)  If needed: Albuterol Nebulizer Albuterol Inhaler - bring with you on day of surgery Budesonide (Pulmicort)   Epinephrine   Flonase nasal spray  Hydrocodone-Acetaminophen  Nitroglycerin tablet  Follow your surgeon's instructions on when to stop Aspirin.  If no instructions were given by your surgeon then you will need to call the office to get those instructions.    As of today, STOP taking any Aleve, Naproxen, Ibuprofen, Motrin, Advil, Goody's, BC's, all herbal medications, fish oil, and all vitamins.  After your COVID test   You are not required to quarantine however you are required to wear a well-fitting mask when you are out and around people not in your household.  If your mask becomes wet or soiled, replace with a new one.  Wash your hands often with soap and water for 20 seconds or clean your hands with an alcohol-based hand sanitizer that contains at least 60% alcohol.  Do not share personal items.  Notify your provider: if you are in close contact with someone who has COVID  or if you develop a fever of 100.4 or greater, sneezing, cough, sore throat, shortness of breath or body aches.   DAY OF SURGERY         Do not wear jewelry, makeup, or nail polish Do not wear lotions, powders, perfumes, or deodorant. Do not shave 48 hours prior to surgery.  Do not bring valuables to the hospital.             [facility] is not responsible for any belongings or valuables.  Do NOT Smoke (Tobacco/Vaping)  24 hours prior to your procedure  If you use a CPAP at night, you may bring your mask for your overnight stay.   Contacts, glasses, hearing aids, dentures or partials may not be worn into surgery, please bring cases for these belongings   For patients admitted to the hospital, discharge time will be determined by your treatment team.   Patients discharged the day of surgery will not be allowed to drive home, and someone needs to stay with them for 24 hours.  NO VISITORS WILL BE ALLOWED IN PRE-OP WHERE PATIENTS ARE PREPPED FOR SURGERY.  ONLY 1 SUPPORT PERSON MAY BE PRESENT IN THE WAITING ROOM WHILE YOU ARE IN SURGERY.  IF YOU ARE TO BE ADMITTED, ONCE YOU ARE IN YOUR ROOM YOU WILL BE ALLOWED TWO (2) VISITORS. 1 (ONE) VISITOR MAY STAY OVERNIGHT BUT MUST ARRIVE TO THE ROOM BY 8pm.  Minor children may have two parents present. Special consideration for safety and communication needs will be reviewed on a case by case basis.  Special instructions:    Oral Hygiene is also important to reduce your risk of infection.  Remember - BRUSH YOUR TEETH THE MORNING OF SURGERY WITH YOUR REGULAR TOOTHPASTE   [facility]- Preparing For Surgery  Before surgery, you can play an important role. Because skin is not  sterile, your skin needs to be as free of germs as possible. You can reduce the number of germs on your skin by washing with CHG (chlorahexidine gluconate) Soap before surgery.  CHG is an antiseptic cleaner which kills germs and bonds with the skin to continue killing germs even after washing.     Please do not use if you have an allergy to CHG or antibacterial soaps. If your skin becomes reddened/irritated stop using the CHG.  Do not shave (including legs and underarms) for at least 48 hours prior to first CHG shower. It is OK to shave your face.  Please follow these instructions carefully.     Shower the  NIGHT BEFORE SURGERY and the MORNING OF SURGERY with CHG Soap.   If you chose to wash your hair, wash your hair first as usual with your normal shampoo. After you shampoo, rinse your hair and body thoroughly to remove the shampoo.  Then [employer] and genitals (private parts) with your normal soap and rinse thoroughly to remove soap.  After that Use CHG Soap as you would any other liquid soap. You can apply CHG directly to the skin and wash gently with a scrungie or a clean washcloth.   Apply the CHG Soap to your body ONLY FROM THE NECK DOWN.  Do not use on open wounds or open sores. Avoid contact with your eyes, ears, mouth and genitals (private parts). Wash Face and genitals (private parts)  with your normal soap.   Wash thoroughly, paying special attention to the area where your surgery will be performed.  Thoroughly rinse your body with warm water from the neck down.  DO NOT shower/wash with your normal soap after using and rinsing off the CHG Soap.  Pat yourself dry with a CLEAN TOWEL.  Wear CLEAN PAJAMAS to bed the night before surgery  Place CLEAN SHEETS on your bed the night before your surgery  DO NOT SLEEP WITH PETS.   Day of Surgery:  Take a shower with CHG soap. Wear Clean/Comfortable clothing the morning of surgery Do not apply any deodorants/lotions.   Remember to brush your teeth WITH YOUR REGULAR TOOTHPASTE.   Please read over the following fact sheets that you were given.

## 2021-06-21 ENCOUNTER — Other Ambulatory Visit: Payer: Self-pay

## 2021-06-21 ENCOUNTER — Encounter (HOSPITAL_COMMUNITY): Payer: Self-pay

## 2021-06-21 ENCOUNTER — Encounter (HOSPITAL_COMMUNITY)
Admission: RE | Admit: 2021-06-21 | Discharge: 2021-06-21 | Disposition: A | Discharge status: Home / Self Care | Payer: Medicare PPO | Source: Ambulatory Visit | Attending: Neurosurgery | Admitting: Neurosurgery

## 2021-06-21 VITALS — BP 142/86 | HR 78 | Temp 97.4°F | Resp 18 | Ht 68.0 in | Wt 178.4 lb

## 2021-06-21 DIAGNOSIS — I252 Old myocardial infarction: Secondary | ICD-10-CM | POA: Diagnosis not present

## 2021-06-21 DIAGNOSIS — E785 Hyperlipidemia, unspecified: Secondary | ICD-10-CM | POA: Diagnosis not present

## 2021-06-21 DIAGNOSIS — Z955 Presence of coronary angioplasty implant and graft: Secondary | ICD-10-CM | POA: Insufficient documentation

## 2021-06-21 DIAGNOSIS — Z01812 Encounter for preprocedural laboratory examination: Secondary | ICD-10-CM | POA: Diagnosis present

## 2021-06-21 DIAGNOSIS — I2510 Atherosclerotic heart disease of native coronary artery without angina pectoris: Secondary | ICD-10-CM | POA: Insufficient documentation

## 2021-06-21 DIAGNOSIS — Z01818 Encounter for other preprocedural examination: Secondary | ICD-10-CM

## 2021-06-21 DIAGNOSIS — I10 Essential (primary) hypertension: Secondary | ICD-10-CM | POA: Insufficient documentation

## 2021-06-21 DIAGNOSIS — Z20822 Contact with and (suspected) exposure to covid-19: Secondary | ICD-10-CM | POA: Insufficient documentation

## 2021-06-21 HISTORY — DX: Acute myocardial infarction, unspecified: I21.9

## 2021-06-21 LAB — BASIC METABOLIC PANEL
Anion gap: 6 (ref 5–15)
BUN: 10 mg/dL (ref 8–23)
CO2: 25 mmol/L (ref 22–32)
Calcium: 9.2 mg/dL (ref 8.9–10.3)
Chloride: 110 mmol/L (ref 98–111)
Creatinine, Ser: 1.29 mg/dL — ABNORMAL HIGH (ref 0.61–1.24)
GFR, Estimated: 59 mL/min — ABNORMAL LOW (ref >60)
Glucose, Bld: 128 mg/dL — ABNORMAL HIGH (ref 70–99)
Potassium: 4.2 mmol/L (ref 3.5–5.1)
Sodium: 141 mmol/L (ref 135–145)

## 2021-06-21 LAB — CBC
HCT: 49.4 % (ref 39.0–52.0)
Hemoglobin: 16.6 g/dL (ref 13.0–17.0)
MCH: 31.1 pg (ref 26.0–34.0)
MCHC: 33.6 g/dL (ref 30.0–36.0)
MCV: 92.7 fL (ref 80.0–100.0)
Platelets: 225 10*3/uL (ref 150–400)
RBC: 5.33 MIL/uL (ref 4.22–5.81)
RDW: 13 % (ref 11.5–15.5)
WBC: 6.3 10*3/uL (ref 4.0–10.5)
nRBC: 0 % (ref 0.0–0.2)

## 2021-06-21 LAB — SURGICAL PCR SCREEN
MRSA, PCR: NEGATIVE
Staphylococcus aureus: POSITIVE — AB

## 2021-06-21 LAB — TYPE AND SCREEN
ABO/RH(D): B POS
Antibody Screen: NEGATIVE

## 2021-06-21 LAB — SARS CORONAVIRUS 2 (TAT 6-24 HRS): SARS Coronavirus 2: NEGATIVE

## 2021-06-21 NOTE — Progress Notes (Signed)
PCP - Judd Lien MD  Cardiologist - Daneen Schick MD  PPM/ICD - denies Device Orders -  Rep Notified -   Chest x-ray -  EKG - 12/26/20 Stress Test - 10/20/14 ECHO - 2015 Cardiac Cath -2015   Sleep Study - none CPAP - none  Fasting Blood Sugar - n/a Checks Blood Sugar _____ times a day  Blood Thinner Instructions:n/a Aspirin Instructions:per pt, his last dose of aspirin was 10/17 per surgeon's instructions.   ERAS Protcol -no PRE-SURGERY Ensure or G2-   COVID TEST- 06/21/21   Anesthesia review: yes- cardiac history.  Patient denies shortness of breath, fever, cough and chest pain at PAT appointment   All instructions explained to the patient, with a verbal understanding of the material. Patient agrees to go over the instructions while at home for a better understanding. Patient also instructed to self quarantine after being tested for COVID-19. The opportunity to ask questions was provided.

## 2021-06-22 ENCOUNTER — Telehealth: Payer: Self-pay | Admitting: Interventional Cardiology

## 2021-06-22 ENCOUNTER — Other Ambulatory Visit: Payer: Self-pay | Admitting: Neurosurgery

## 2021-06-22 NOTE — Progress Notes (Signed)
Anesthesia Chart Review:  Follows with cardiology for hx of CAD status post inferior STEMI treated with DES to the RCA 2015, ischemic cardiomyopathy EF 45 to 50% by echo 2015; EF 67% by nuclear stress 2016, hypertension, hyperlipidemia.  Last seen 12/26/2020 and was reportedly riding a recumbent bike 30 minutes daily without issue.  Cardiac clearance per telephone encounter 06/22/2021, "Chart reviewed as part of pre-operative protocol coverage. Given past medical history and time since last visit, based on ACC/AHA guidelines, Eric Cortez would be at acceptable risk for the planned procedure without further cardiovascular testing."  Preop labs reviewed, creatinine mildly elevated 1.29, otherwise unremarkable.  EKG 12/26/2020: Normal sinus rhythm.  Rate 79.  Old inferior MI.  Nuclear stress 10/21/2014: Overall Impression:  Low risk stress nuclear study with a medium sized, moderate intensity, fixed defect in the inferior wall consistent with diaphragmatic attenuation.  No evidence of ischemia. .   LV Ejection Fraction: 67%.  LV Wall Motion:  NL LV Function; NL Wall Motion   TTE 02/25/2014: - Left ventricle: The cavity size was normal. Wall thickness was    normal. Systolic function was mildly reduced. The estimated    ejection fraction was in the range of 45% to 50%. Inferoseptal    and inferior severe hypokinesis. Doppler parameters are    consistent with abnormal left ventricular relaxation (grade 1    diastolic dysfunction).  - Aortic valve: There was no stenosis.  - Mitral valve: There was no significant regurgitation.  - Right ventricle: The cavity size was normal. Systolic function    was normal.  - Pulmonary arteries: No complete TR doppler jet so unable to    estimate PA systolic pressure.  - Inferior vena cava: The vessel was normal in size. The    respirophasic diameter changes were in the normal range (= 50%),    consistent with normal central venous pressure.   Impressions:    - Normal LV size with mildly decreased systolic function, EF    86-16%. Severe hypokinesis of the inferoseptal and inferior    walls. Normal RV size and systoilc function. No significant    valvular abnormalities.   [facility]Anesthesiology Phone [phone] 06/22/2021 4:32 PM

## 2021-06-22 NOTE — Telephone Encounter (Signed)
   Primary Cardiologist: Sinclair Grooms, MD  Chart reviewed as part of pre-operative protocol coverage. Given past medical history and time since last visit, based on ACC/AHA guidelines, Eric Cortez would be at acceptable risk for the planned procedure without further cardiovascular testing.   Patient was advised that if he develops new symptoms prior to surgery to contact our office to arrange a follow-up appointment.  He verbalized understanding.  I will route this recommendation to the requesting party via Epic fax function and remove from pre-op pool.  Please call with questions.  Jossie Ng. Chenoah Mcnally NP-C    06/22/2021, 3:07 PM [address] Office [phone] Fax [phone]

## 2021-06-22 NOTE — Telephone Encounter (Signed)
Wife of the patient was returning Jesse's call about surgical clearance

## 2021-06-22 NOTE — Telephone Encounter (Signed)
Left message to call back on 06/22/2021 at 1355.  Patient needs call back.

## 2021-06-22 NOTE — Telephone Encounter (Signed)
   [facility] Pre-operative Risk Assessment    Patient Name: Eric Cortez  DOB: 1951-02-14 MRN: [id number]  HEARTCARE STAFF:  - IMPORTANT!!!!!! Under Visit Info/Reason for Call, type in Other and utilize the format Clearance MM/DD/YY or Clearance TBD. Do not use dashes or single digits. - Please review there is not already an duplicate clearance open for this procedure. - If request is for dental extraction, please clarify the # of teeth to be extracted. - If the patient is currently at the dentist's office, call Pre-Op Callback Staff (MA/nurse) to input urgent request.  - If the patient is not currently in the dentist office, please route to the Pre-Op pool.  Request for surgical clearance:  What type of surgery is being performed? Lumbar Fusion  When is this surgery scheduled? 06-25-21  What type of clearance is required (medical clearance vs. Pharmacy clearance to hold med vs. Both)? Medical  Are there any medications that need to be held prior to surgery and how long?   Practice name and name of physician performing surgery? Dr Newman Pies  What is the office phone number? [phone]   7.   What is the office fax number? [phone]  8.   Anesthesia type (None, local, MAC, general) ?  General   Glyn Ade 06/22/2021, 1:38 PM  _________________________________________________________________   (provider comments below)

## 2021-06-22 NOTE — Anesthesia Preprocedure Evaluation (Addendum)
Anesthesia Evaluation  Patient identified by MRN, date of birth, ID band Patient awake    Reviewed: Allergy & Precautions, NPO status , Patient's Chart, lab work & pertinent test results  Airway Mallampati: II  TM Distance: >3 FB Neck ROM: Full    Dental no notable dental hx. (+) Implants   Pulmonary neg pulmonary ROS,    Pulmonary exam normal breath sounds clear to auscultation       Cardiovascular hypertension, + CAD and + Past MI  Normal cardiovascular exam Rhythm:Regular Rate:Normal  Hx of ischemic cardiomyopathy  EF 67%   Neuro/Psych negative psych ROS   GI/Hepatic negative GI ROS, Neg liver ROS,   Endo/Other  negative endocrine ROS  Renal/GU Renal InsufficiencyRenal diseaseLab Results      Component                Value               Date                      CREATININE               1.29 (H)            06/21/2021                BUN                      10                  06/21/2021                NA                       141                 06/21/2021                K                        4.2                 06/21/2021                CL                       110                 06/21/2021                CO2                      25                  06/21/2021             negative genitourinary   Musculoskeletal  (+) Arthritis ,   Abdominal   Peds  Hematology Lab Results      Component                Value               Date                      WBC  6.3                 06/21/2021                HGB                      16.6                06/21/2021                HCT                      49.4                06/21/2021                MCV                      92.7                06/21/2021                PLT                      225                 06/21/2021              Anesthesia Other Findings   Reproductive/Obstetrics                           Anesthesia  Physical Anesthesia Plan  ASA: 2  Anesthesia Plan: General   Post-op Pain Management:    Induction: Intravenous  PONV Risk Score and Plan: 3 and Treatment may vary due to age or medical condition, Ondansetron and Midazolam  Airway Management Planned: Oral ETT  Additional Equipment:   Intra-op Plan:   Post-operative Plan: Extubation in OR  Informed Consent: I have reviewed the patients History and Physical, chart, labs and discussed the procedure including the risks, benefits and alternatives for the proposed anesthesia with the patient or authorized representative who has indicated his/her understanding and acceptance.     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  Last seen 12/26/2020 and was reportedly riding a recumbent bike 30 minutes daily without issue.  Cardiac clearance per telephone encounter 06/22/2021, "Chart reviewed as part of pre-operative protocol coverage. Given past medical history and time since last visit, based on ACC/AHA guidelines,Hiroki A Lightwould be at acceptable risk for the planned procedure without further cardiovascular testing."  Preop labs reviewed, creatinine mildly elevated 1.29, otherwise unremarkable.  EKG 12/26/2020: Normal sinus rhythm.  Rate 79.  Old inferior MI.  Nuclear stress 10/21/2014: Overall Impression: Low risk stress nuclear study with a medium sized, moderate intensity, fixed defect in the inferior wall consistent with diaphragmatic attenuation. No evidence of ischemia. .  LV Ejection Fraction: 67%. LV Wall Motion: NL LV Function; NL Wall Motion  TTE 02/25/2014: - Left ventricle: The cavity size was normal. Wall thickness was  normal. Systolic function  was mildly reduced. The estimated  ejection fraction was in the range of 45% to  50%. Inferoseptal  and inferior severe hypokinesis. Doppler parameters are  consistent with abnormal left ventricular relaxation (grade 1  diastolic dysfunction).  - Aortic valve: There was no stenosis.  - Mitral valve: There was no significant regurgitation.  - Right ventricle: The cavity size was normal. Systolic function  was normal.  - Pulmonary arteries: No complete TR doppler jet so unable to  estimate PA systolic pressure.  - Inferior vena cava: The vessel was normal in size. The  respirophasic diameter changes were in the normal range (= 50%),  consistent with normal central venous pressure.   Impressions:   - Normal LV size with mildly decreased systolic function, EF  30-07%. Severe hypokinesis of the inferoseptal and inferior  walls. Normal RV size and systoilc function. No significant  valvular abnormalities.  )       Anesthesia Quick Evaluation

## 2021-06-25 ENCOUNTER — Inpatient Hospital Stay (HOSPITAL_COMMUNITY): Payer: Medicare PPO

## 2021-06-25 ENCOUNTER — Inpatient Hospital Stay (HOSPITAL_COMMUNITY): Payer: Medicare PPO | Admitting: Certified Registered Nurse Anesthetist

## 2021-06-25 ENCOUNTER — Other Ambulatory Visit: Payer: Self-pay

## 2021-06-25 ENCOUNTER — Inpatient Hospital Stay (HOSPITAL_COMMUNITY): Payer: Medicare PPO | Admitting: Physician Assistant

## 2021-06-25 ENCOUNTER — Inpatient Hospital Stay (HOSPITAL_COMMUNITY)
Admission: RE | Disposition: A | Discharge status: Home / Self Care | Payer: Self-pay | Source: Home / Self Care | Attending: Neurosurgery

## 2021-06-25 ENCOUNTER — Encounter (HOSPITAL_COMMUNITY): Payer: Self-pay | Admitting: Neurosurgery

## 2021-06-25 ENCOUNTER — Inpatient Hospital Stay (HOSPITAL_COMMUNITY)
Admission: RE | Admit: 2021-06-25 | Discharge: 2021-06-26 | DRG: 455 | Disposition: A | Discharge status: Home / Self Care | Payer: Medicare PPO | Attending: Neurosurgery | Admitting: Neurosurgery

## 2021-06-25 DIAGNOSIS — Z79899 Other long term (current) drug therapy: Secondary | ICD-10-CM

## 2021-06-25 DIAGNOSIS — I255 Ischemic cardiomyopathy: Secondary | ICD-10-CM | POA: Diagnosis present

## 2021-06-25 DIAGNOSIS — Z8249 Family history of ischemic heart disease and other diseases of the circulatory system: Secondary | ICD-10-CM | POA: Diagnosis not present

## 2021-06-25 DIAGNOSIS — Z419 Encounter for procedure for purposes other than remedying health state, unspecified: Secondary | ICD-10-CM

## 2021-06-25 DIAGNOSIS — Z20822 Contact with and (suspected) exposure to covid-19: Secondary | ICD-10-CM | POA: Diagnosis present

## 2021-06-25 DIAGNOSIS — M4316 Spondylolisthesis, lumbar region: Secondary | ICD-10-CM | POA: Diagnosis present

## 2021-06-25 DIAGNOSIS — J45909 Unspecified asthma, uncomplicated: Secondary | ICD-10-CM | POA: Diagnosis present

## 2021-06-25 DIAGNOSIS — Z833 Family history of diabetes mellitus: Secondary | ICD-10-CM | POA: Diagnosis not present

## 2021-06-25 DIAGNOSIS — Z7982 Long term (current) use of aspirin: Secondary | ICD-10-CM

## 2021-06-25 DIAGNOSIS — I252 Old myocardial infarction: Secondary | ICD-10-CM | POA: Diagnosis not present

## 2021-06-25 DIAGNOSIS — Z955 Presence of coronary angioplasty implant and graft: Secondary | ICD-10-CM | POA: Diagnosis not present

## 2021-06-25 DIAGNOSIS — Z23 Encounter for immunization: Secondary | ICD-10-CM

## 2021-06-25 DIAGNOSIS — I2510 Atherosclerotic heart disease of native coronary artery without angina pectoris: Secondary | ICD-10-CM | POA: Diagnosis present

## 2021-06-25 DIAGNOSIS — Z800 Family history of malignant neoplasm of digestive organs: Secondary | ICD-10-CM | POA: Diagnosis not present

## 2021-06-25 DIAGNOSIS — M48062 Spinal stenosis, lumbar region with neurogenic claudication: Secondary | ICD-10-CM | POA: Diagnosis present

## 2021-06-25 DIAGNOSIS — I10 Essential (primary) hypertension: Secondary | ICD-10-CM | POA: Diagnosis present

## 2021-06-25 DIAGNOSIS — M5116 Intervertebral disc disorders with radiculopathy, lumbar region: Secondary | ICD-10-CM | POA: Diagnosis present

## 2021-06-25 DIAGNOSIS — Z7951 Long term (current) use of inhaled steroids: Secondary | ICD-10-CM | POA: Diagnosis not present

## 2021-06-25 HISTORY — DX: Spondylolisthesis, lumbar region: M43.16

## 2021-06-25 LAB — ABO/RH: ABO/RH(D): B POS

## 2021-06-25 SURGERY — POSTERIOR LUMBAR FUSION 1 LEVEL
Anesthesia: General

## 2021-06-25 MED ORDER — MORPHINE SULFATE (PF) 4 MG/ML IV SOLN: 4.0000 mg | INTRAVENOUS | Status: DC | PRN

## 2021-06-25 MED ORDER — LACTATED RINGERS IV SOLN: INTRAVENOUS | Status: DC

## 2021-06-25 MED ORDER — COQ-10 150 MG PO CAPS: 150.0000 mg | ORAL_CAPSULE | Freq: Every day | ORAL | Status: DC

## 2021-06-25 MED ORDER — BUPROPION HCL ER (XL) 150 MG PO TB24
150.0000 mg | ORAL_TABLET | Freq: Every day | ORAL | Status: DC
  Filled 2021-06-25 (×2): qty 1

## 2021-06-25 MED ORDER — ALBUTEROL SULFATE HFA 108 (90 BASE) MCG/ACT IN AERS: 2.0000 | INHALATION_SPRAY | Freq: Four times a day (QID) | RESPIRATORY_TRACT | Status: DC | PRN

## 2021-06-25 MED ORDER — ROCURONIUM BROMIDE 10 MG/ML (PF) SYRINGE
PREFILLED_SYRINGE | INTRAVENOUS | Status: DC | PRN
  Administered 2021-06-25: 60 mg via INTRAVENOUS
  Administered 2021-06-25 (×4): 20 mg via INTRAVENOUS

## 2021-06-25 MED ORDER — DOCUSATE SODIUM 100 MG PO CAPS
100.0000 mg | ORAL_CAPSULE | Freq: Two times a day (BID) | ORAL | Status: DC
  Administered 2021-06-25: 100 mg via ORAL
  Filled 2021-06-25: qty 1

## 2021-06-25 MED ORDER — HYDROMORPHONE HCL 1 MG/ML IJ SOLN
0.2500 mg | INTRAMUSCULAR | Status: DC | PRN
  Administered 2021-06-25: 0.5 mg via INTRAVENOUS

## 2021-06-25 MED ORDER — BUPIVACAINE-EPINEPHRINE (PF) 0.5% -1:200000 IJ SOLN
INTRAMUSCULAR | Status: DC | PRN
  Administered 2021-06-25: 10 mL via PERINEURAL

## 2021-06-25 MED ORDER — NITROGLYCERIN 0.4 MG SL SUBL: 0.4000 mg | SUBLINGUAL_TABLET | SUBLINGUAL | Status: DC | PRN

## 2021-06-25 MED ORDER — ONDANSETRON HCL 4 MG/2ML IJ SOLN: 4.0000 mg | Freq: Four times a day (QID) | INTRAMUSCULAR | Status: DC | PRN

## 2021-06-25 MED ORDER — CHLORHEXIDINE GLUCONATE CLOTH 2 % EX PADS: 6.0000 | MEDICATED_PAD | Freq: Once | CUTANEOUS | Status: DC

## 2021-06-25 MED ORDER — EPHEDRINE SULFATE 50 MG/ML IJ SOLN
INTRAMUSCULAR | Status: DC | PRN
  Administered 2021-06-25 (×3): 5 mg via INTRAVENOUS

## 2021-06-25 MED ORDER — ALFUZOSIN HCL ER 10 MG PO TB24
10.0000 mg | ORAL_TABLET | Freq: Every day | ORAL | Status: DC
  Filled 2021-06-25 (×2): qty 1

## 2021-06-25 MED ORDER — MENTHOL 3 MG MT LOZG: 1.0000 | LOZENGE | OROMUCOSAL | Status: DC | PRN

## 2021-06-25 MED ORDER — ALBUTEROL SULFATE (2.5 MG/3ML) 0.083% IN NEBU: 2.5000 mg | INHALATION_SOLUTION | Freq: Four times a day (QID) | RESPIRATORY_TRACT | Status: DC | PRN

## 2021-06-25 MED ORDER — ACETAMINOPHEN 10 MG/ML IV SOLN
INTRAVENOUS | Status: AC
  Filled 2021-06-25: qty 100

## 2021-06-25 MED ORDER — BUPIVACAINE LIPOSOME 1.3 % IJ SUSP
INTRAMUSCULAR | Status: AC
  Filled 2021-06-25: qty 20

## 2021-06-25 MED ORDER — ROCURONIUM BROMIDE 10 MG/ML (PF) SYRINGE
PREFILLED_SYRINGE | INTRAVENOUS | Status: AC
  Filled 2021-06-25: qty 10

## 2021-06-25 MED ORDER — ORAL CARE MOUTH RINSE: 15.0000 mL | Freq: Once | OROMUCOSAL | Status: AC

## 2021-06-25 MED ORDER — BUPIVACAINE-EPINEPHRINE 0.5% -1:200000 IJ SOLN
INTRAMUSCULAR | Status: AC
  Filled 2021-06-25: qty 1

## 2021-06-25 MED ORDER — FENTANYL CITRATE (PF) 250 MCG/5ML IJ SOLN
INTRAMUSCULAR | Status: AC
  Filled 2021-06-25: qty 5

## 2021-06-25 MED ORDER — FENTANYL CITRATE (PF) 250 MCG/5ML IJ SOLN
INTRAMUSCULAR | Status: DC | PRN
  Administered 2021-06-25: 50 ug via INTRAVENOUS
  Administered 2021-06-25: 125 ug via INTRAVENOUS
  Administered 2021-06-25: 50 ug via INTRAVENOUS
  Administered 2021-06-25: 25 ug via INTRAVENOUS

## 2021-06-25 MED ORDER — PHENYLEPHRINE 40 MCG/ML (10ML) SYRINGE FOR IV PUSH (FOR BLOOD PRESSURE SUPPORT)
PREFILLED_SYRINGE | INTRAVENOUS | Status: AC
  Filled 2021-06-25: qty 10

## 2021-06-25 MED ORDER — ACETAMINOPHEN 650 MG RE SUPP: 650.0000 mg | RECTAL | Status: DC | PRN

## 2021-06-25 MED ORDER — THROMBIN 5000 UNITS EX SOLR
OROMUCOSAL | Status: DC | PRN
  Administered 2021-06-25 (×3): 5 mL via TOPICAL

## 2021-06-25 MED ORDER — CEFAZOLIN SODIUM-DEXTROSE 2-4 GM/100ML-% IV SOLN
2.0000 g | INTRAVENOUS | Status: AC
  Administered 2021-06-25: 2 g via INTRAVENOUS
  Filled 2021-06-25: qty 100

## 2021-06-25 MED ORDER — CEFAZOLIN SODIUM-DEXTROSE 2-4 GM/100ML-% IV SOLN
2.0000 g | Freq: Three times a day (TID) | INTRAVENOUS | Status: AC
  Administered 2021-06-25 – 2021-06-26 (×2): 2 g via INTRAVENOUS
  Filled 2021-06-25 (×2): qty 100

## 2021-06-25 MED ORDER — FLUTICASONE PROPIONATE 50 MCG/ACT NA SUSP
1.0000 | Freq: Every day | NASAL | Status: DC | PRN
  Filled 2021-06-25: qty 16

## 2021-06-25 MED ORDER — OXYCODONE HCL 5 MG PO TABS: 5.0000 mg | ORAL_TABLET | Freq: Once | ORAL | Status: DC | PRN

## 2021-06-25 MED ORDER — ATORVASTATIN CALCIUM 10 MG PO TABS
20.0000 mg | ORAL_TABLET | Freq: Every day | ORAL | Status: DC
  Administered 2021-06-25: 20 mg via ORAL
  Filled 2021-06-25: qty 2

## 2021-06-25 MED ORDER — THROMBIN 5000 UNITS EX SOLR
CUTANEOUS | Status: AC
  Filled 2021-06-25: qty 5000

## 2021-06-25 MED ORDER — BACITRACIN ZINC 500 UNIT/GM EX OINT
TOPICAL_OINTMENT | CUTANEOUS | Status: AC
  Filled 2021-06-25: qty 28.35

## 2021-06-25 MED ORDER — CYCLOBENZAPRINE HCL 10 MG PO TABS
10.0000 mg | ORAL_TABLET | Freq: Three times a day (TID) | ORAL | Status: DC | PRN
  Administered 2021-06-25 – 2021-06-26 (×2): 10 mg via ORAL
  Filled 2021-06-25 (×2): qty 1

## 2021-06-25 MED ORDER — BUPIVACAINE LIPOSOME 1.3 % IJ SUSP
INTRAMUSCULAR | Status: DC | PRN
  Administered 2021-06-25: 20 mL

## 2021-06-25 MED ORDER — ONDANSETRON HCL 4 MG PO TABS: 4.0000 mg | ORAL_TABLET | Freq: Four times a day (QID) | ORAL | Status: DC | PRN

## 2021-06-25 MED ORDER — PHENYLEPHRINE HCL-NACL 20-0.9 MG/250ML-% IV SOLN
INTRAVENOUS | Status: DC | PRN
Start: 2021-06-25 — End: 2021-06-25
  Administered 2021-06-25: 25 ug/min via INTRAVENOUS

## 2021-06-25 MED ORDER — LIDOCAINE 2% (20 MG/ML) 5 ML SYRINGE
INTRAMUSCULAR | Status: DC | PRN
  Administered 2021-06-25: 100 mg via INTRAVENOUS

## 2021-06-25 MED ORDER — BISACODYL 10 MG RE SUPP: 10.0000 mg | Freq: Every day | RECTAL | Status: DC | PRN

## 2021-06-25 MED ORDER — SODIUM CHLORIDE 0.9% FLUSH: 3.0000 mL | Freq: Two times a day (BID) | INTRAVENOUS | Status: DC

## 2021-06-25 MED ORDER — ACETAMINOPHEN 325 MG PO TABS
650.0000 mg | ORAL_TABLET | ORAL | Status: DC | PRN
  Administered 2021-06-26: 650 mg via ORAL
  Filled 2021-06-25: qty 2

## 2021-06-25 MED ORDER — OXYCODONE HCL 5 MG/5ML PO SOLN
5.0000 mg | Freq: Once | ORAL | Status: DC | PRN
Start: 2021-06-25 — End: 2021-06-25

## 2021-06-25 MED ORDER — ONDANSETRON HCL 4 MG/2ML IJ SOLN
INTRAMUSCULAR | Status: AC
  Filled 2021-06-25: qty 2

## 2021-06-25 MED ORDER — ACETAMINOPHEN 10 MG/ML IV SOLN
1000.0000 mg | Freq: Once | INTRAVENOUS | Status: DC | PRN
  Administered 2021-06-25: 1000 mg via INTRAVENOUS

## 2021-06-25 MED ORDER — CHLORHEXIDINE GLUCONATE 0.12 % MT SOLN
15.0000 mL | Freq: Once | OROMUCOSAL | Status: AC
  Administered 2021-06-25: 15 mL via OROMUCOSAL
  Filled 2021-06-25: qty 15

## 2021-06-25 MED ORDER — DEXAMETHASONE SODIUM PHOSPHATE 10 MG/ML IJ SOLN
INTRAMUSCULAR | Status: AC
  Filled 2021-06-25: qty 1

## 2021-06-25 MED ORDER — LIDOCAINE 2% (20 MG/ML) 5 ML SYRINGE
INTRAMUSCULAR | Status: AC
  Filled 2021-06-25: qty 5

## 2021-06-25 MED ORDER — PROPOFOL 10 MG/ML IV BOLUS
INTRAVENOUS | Status: AC
  Filled 2021-06-25: qty 20

## 2021-06-25 MED ORDER — OXYCODONE HCL 5 MG PO TABS
10.0000 mg | ORAL_TABLET | ORAL | Status: DC | PRN
  Administered 2021-06-25 – 2021-06-26 (×3): 10 mg via ORAL
  Filled 2021-06-25 (×3): qty 2

## 2021-06-25 MED ORDER — 0.9 % SODIUM CHLORIDE (POUR BTL) OPTIME
TOPICAL | Status: DC | PRN
  Administered 2021-06-25: 1000 mL

## 2021-06-25 MED ORDER — BACITRACIN ZINC 500 UNIT/GM EX OINT
TOPICAL_OINTMENT | CUTANEOUS | Status: DC | PRN
  Administered 2021-06-25: 1 via TOPICAL

## 2021-06-25 MED ORDER — ONDANSETRON HCL 4 MG/2ML IJ SOLN
INTRAMUSCULAR | Status: DC | PRN
  Administered 2021-06-25: 4 mg via INTRAVENOUS

## 2021-06-25 MED ORDER — DEXAMETHASONE SODIUM PHOSPHATE 10 MG/ML IJ SOLN
INTRAMUSCULAR | Status: DC | PRN
  Administered 2021-06-25: 5 mg via INTRAVENOUS

## 2021-06-25 MED ORDER — HYDROCODONE-ACETAMINOPHEN 5-325 MG PO TABS: 1.0000 | ORAL_TABLET | ORAL | Status: DC | PRN

## 2021-06-25 MED ORDER — BUDESONIDE 0.25 MG/2ML IN SUSP
0.2500 mg | Freq: Two times a day (BID) | RESPIRATORY_TRACT | Status: DC
  Filled 2021-06-25 (×3): qty 2

## 2021-06-25 MED ORDER — ACETAMINOPHEN 500 MG PO TABS
1000.0000 mg | ORAL_TABLET | Freq: Four times a day (QID) | ORAL | Status: DC
  Administered 2021-06-25 (×2): 1000 mg via ORAL
  Filled 2021-06-25 (×2): qty 2

## 2021-06-25 MED ORDER — PHENYLEPHRINE HCL (PRESSORS) 10 MG/ML IV SOLN
INTRAVENOUS | Status: DC | PRN
  Administered 2021-06-25: 80 ug via INTRAVENOUS
  Administered 2021-06-25: 40 ug via INTRAVENOUS
  Administered 2021-06-25 (×2): 80 ug via INTRAVENOUS

## 2021-06-25 MED ORDER — HYDROMORPHONE HCL 1 MG/ML IJ SOLN
INTRAMUSCULAR | Status: AC
  Filled 2021-06-25: qty 1

## 2021-06-25 MED ORDER — PHENOL 1.4 % MT LIQD: 1.0000 | OROMUCOSAL | Status: DC | PRN

## 2021-06-25 MED ORDER — PROPOFOL 10 MG/ML IV BOLUS
INTRAVENOUS | Status: DC | PRN
  Administered 2021-06-25: 120 mg via INTRAVENOUS

## 2021-06-25 MED ORDER — SODIUM CHLORIDE 0.9 % IV SOLN: 250.0000 mL | INTRAVENOUS | Status: DC

## 2021-06-25 MED ORDER — SODIUM CHLORIDE 0.9% FLUSH: 3.0000 mL | INTRAVENOUS | Status: DC | PRN

## 2021-06-25 MED ORDER — ONDANSETRON HCL 4 MG/2ML IJ SOLN
4.0000 mg | Freq: Once | INTRAMUSCULAR | Status: DC | PRN
Start: 2021-06-25 — End: 2021-06-25

## 2021-06-25 MED ORDER — OXYCODONE HCL 5 MG PO TABS: 5.0000 mg | ORAL_TABLET | ORAL | Status: DC | PRN

## 2021-06-25 SURGICAL SUPPLY — 64 items
BAG COUNTER SPONGE SURGICOUNT (BAG) ×2 IMPLANT
BASKET BONE COLLECTION (BASKET) ×2 IMPLANT
BENZOIN TINCTURE PRP APPL 2/3 (GAUZE/BANDAGES/DRESSINGS) ×2 IMPLANT
BLADE CLIPPER SURG (BLADE) ×2 IMPLANT
BUR MATCHSTICK NEURO 3.0 LAGG (BURR) ×2 IMPLANT
BUR PRECISION FLUTE 6.0 (BURR) ×2 IMPLANT
CAGE ALTERA 10X31X9-13 15D (Cage) ×2 IMPLANT
CANISTER SUCT 3000ML PPV (MISCELLANEOUS) ×2 IMPLANT
CAP LOCK DLX THRD (Cap) ×8 IMPLANT
CARTRIDGE OIL MAESTRO DRILL (MISCELLANEOUS) ×1 IMPLANT
CNTNR URN SCR LID CUP LEK RST (MISCELLANEOUS) ×1 IMPLANT
CONT SPEC 4OZ STRL OR WHT (MISCELLANEOUS) ×1
COVER BACK TABLE 60X90IN (DRAPES) ×2 IMPLANT
DECANTER SPIKE VIAL GLASS SM (MISCELLANEOUS) ×2 IMPLANT
DIFFUSER DRILL AIR PNEUMATIC (MISCELLANEOUS) ×2 IMPLANT
DRAPE C-ARM 42X72 X-RAY (DRAPES) ×6 IMPLANT
DRAPE HALF SHEET 40X57 (DRAPES) ×6 IMPLANT
DRAPE LAPAROTOMY 100X72X124 (DRAPES) ×2 IMPLANT
DRAPE SURG 17X23 STRL (DRAPES) ×8 IMPLANT
DRSG OPSITE POSTOP 4X6 (GAUZE/BANDAGES/DRESSINGS) ×2 IMPLANT
ELECT BLADE 4.0 EZ CLEAN MEGAD (MISCELLANEOUS) ×2
ELECT REM PT RETURN 9FT ADLT (ELECTROSURGICAL) ×2
ELECTRODE BLDE 4.0 EZ CLN MEGD (MISCELLANEOUS) ×1 IMPLANT
ELECTRODE REM PT RTRN 9FT ADLT (ELECTROSURGICAL) ×1 IMPLANT
EVACUATOR 1/8 PVC DRAIN (DRAIN) ×2 IMPLANT
GAUZE 4X4 16PLY ~~LOC~~+RFID DBL (SPONGE) ×2 IMPLANT
GLOVE EXAM NITRILE XL STR (GLOVE) IMPLANT
GLOVE SURG ENC MOIS LTX SZ7.5 (GLOVE) ×8 IMPLANT
GLOVE SURG ENC MOIS LTX SZ8 (GLOVE) ×4 IMPLANT
GLOVE SURG ENC MOIS LTX SZ8.5 (GLOVE) ×4 IMPLANT
GLOVE SURG UNDER LTX SZ7.5 (GLOVE) ×4 IMPLANT
GOWN STRL REUS W/ TWL LRG LVL3 (GOWN DISPOSABLE) ×2 IMPLANT
GOWN STRL REUS W/ TWL XL LVL3 (GOWN DISPOSABLE) ×4 IMPLANT
GOWN STRL REUS W/TWL 2XL LVL3 (GOWN DISPOSABLE) IMPLANT
GOWN STRL REUS W/TWL LRG LVL3 (GOWN DISPOSABLE) ×4
GOWN STRL REUS W/TWL XL LVL3 (GOWN DISPOSABLE) ×8
HEMOSTAT POWDER KIT SURGIFOAM (HEMOSTASIS) ×6 IMPLANT
KIT BASIN OR (CUSTOM PROCEDURE TRAY) ×2 IMPLANT
KIT GRAFTMAG DEL NEURO DISP (NEUROSURGERY SUPPLIES) ×2 IMPLANT
KIT TURNOVER KIT B (KITS) ×2 IMPLANT
MILL MEDIUM DISP (BLADE) ×2 IMPLANT
NEEDLE HYPO 21X1.5 SAFETY (NEEDLE) ×2 IMPLANT
NEEDLE HYPO 22GX1.5 SAFETY (NEEDLE) ×2 IMPLANT
NS IRRIG 1000ML POUR BTL (IV SOLUTION) ×2 IMPLANT
OIL CARTRIDGE MAESTRO DRILL (MISCELLANEOUS) ×2
PACK LAMINECTOMY NEURO (CUSTOM PROCEDURE TRAY) ×2 IMPLANT
PAD ARMBOARD 7.5X6 YLW CONV (MISCELLANEOUS) ×4 IMPLANT
PATTIES SURGICAL .5 X1 (DISPOSABLE) ×2 IMPLANT
PUTTY DBM 10CC CALC GRAN (Putty) ×2 IMPLANT
ROD CURVED TI 6.35X45 (Rod) ×4 IMPLANT
SCREW PA DLX CREO 7.5X50 (Screw) ×4 IMPLANT
SCREW PA DLX CREO 7.5X55 (Screw) ×4 IMPLANT
SPONGE NEURO XRAY DETECT 1X3 (DISPOSABLE) IMPLANT
SPONGE SURGIFOAM ABS GEL 100 (HEMOSTASIS) IMPLANT
SPONGE T-LAP 4X18 ~~LOC~~+RFID (SPONGE) ×4 IMPLANT
STRIP CLOSURE SKIN 1/2X4 (GAUZE/BANDAGES/DRESSINGS) ×2 IMPLANT
SUT VIC AB 1 CT1 18XBRD ANBCTR (SUTURE) ×2 IMPLANT
SUT VIC AB 1 CT1 8-18 (SUTURE) ×2
SUT VIC AB 2-0 CP2 18 (SUTURE) ×4 IMPLANT
SYR 20ML LL LF (SYRINGE) ×2 IMPLANT
TOWEL GREEN STERILE (TOWEL DISPOSABLE) ×2 IMPLANT
TOWEL GREEN STERILE FF (TOWEL DISPOSABLE) ×2 IMPLANT
TRAY FOLEY MTR SLVR 16FR STAT (SET/KITS/TRAYS/PACK) ×2 IMPLANT
WATER STERILE IRR 1000ML POUR (IV SOLUTION) ×2 IMPLANT

## 2021-06-25 NOTE — H&P (Signed)
Subjective: The patient is a 71 year old white male who is complained of back and bilateral leg pain consistent with neurogenic claudication.  He has failed medical management and was worked up with a lumbar muscle CT which demonstrated lumbar spine listhesis, spinal stenosis, etc.  I discussed the various treatment options with him.  He has decided proceed with surgery.  Past Medical History:  Diagnosis Date   Asthma    Atypical mole 2014   moderate left chest   CAD (coronary artery disease)    a. 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 01/2014 Echo: EF 45-50%, inferoseptal/inf sev HK, Gr 1 DD, nl valves.   Myocardial infarction [facility])    Urticaria     Past Surgical History:  Procedure Laterality Date   CORONARY STENT PLACEMENT  02/25/2014   LEFT HEART CATHETERIZATION WITH CORONARY ANGIOGRAM N/A 02/25/2014   Procedure: LEFT HEART CATHETERIZATION WITH CORONARY ANGIOGRAM;  Surgeon: Sinclair Grooms, MD;  Location: [facility] CATH LAB;  Service: Cardiovascular;  Laterality: N/A;   PERCUTANEOUS CORONARY STENT INTERVENTION (PCI-S)  02/25/2014   Procedure: PERCUTANEOUS CORONARY STENT INTERVENTION (PCI-S);  Surgeon: Sinclair Grooms, MD;  Location: [facility] CATH LAB;  Service: Cardiovascular;;   TONSILLECTOMY      No Known Allergies  Social History   Tobacco Use   Smoking status: Never   Smokeless tobacco: Never  Substance Use Topics   Alcohol use: Yes    Alcohol/week: 3.0 standard drinks    Types: 3 Glasses of wine per week    Comment: occassional ETOH    Family History  Problem Relation Age of Onset   Heart attack Father    Hypertension Father    Heart disease Father    Diabetes Father    Colon cancer Mother    Diverticulosis Sister    Heart disease Maternal Aunt    Prior to Admission  medications   Medication Sig Start Date End Date Taking? Authorizing Provider  alfuzosin (UROXATRAL) 10 MG 24 hr tablet Take 1 tablet (10 mg total) by mouth daily. 05/14/21  Yes Franchot Gallo, MD  aspirin EC 81 MG tablet Take 1 tablet (81 mg total) by mouth daily. 01/17/15  Yes Belva Crome, MD  atorvastatin (LIPITOR) 20 MG tablet Take 1 tablet by mouth once daily 04/04/21  Yes Belva Crome, MD  buPROPion (WELLBUTRIN XL) 150 MG 24 hr tablet Take 150 mg by mouth daily. 11/17/19  Yes [provider]  EPINEPHrine 0.3 mg/0.3 mL IJ SOAJ injection Use for life-threatening allergic reactions 01/12/19  Yes Kozlow, Donnamarie Poag, MD  HYDROcodone-acetaminophen (NORCO/VICODIN) 5-325 MG tablet Take 1 tablet by mouth every 6 (six) hours as needed for pain. 06/13/21  Yes [provider]  nitroGLYCERIN (NITROSTAT) 0.4 MG SL tablet Place 1 tablet (0.4 mg total) under the tongue every 5 (five) minutes x 3 doses as needed for chest pain. 12/26/20  Yes Imogene Burn, PA-C  omalizumab Arvid Right) 150 MG/ML prefilled syringe Inject 300 mg into the skin every 28 (twenty-eight) days. 09/18/20  Yes Kozlow, Donnamarie Poag, MD  albuterol (PROVENTIL) (2.5 MG/3ML) 0.083% nebulizer solution Use one vial in nebulizer every four to six hours as needed for cough or wheeze. 01/12/19   Kozlow, Donnamarie Poag, MD  albuterol (  VENTOLIN HFA) 108 (90 Base) MCG/ACT inhaler Inhale 2 puffs into the lungs every 6 (six) hours as needed for wheezing or shortness of breath. 01/12/19   Kozlow, Donnamarie Poag, MD  budesonide (PULMICORT) 180 MCG/ACT inhaler Inhale two doses twice daily during flare-up as directed. Rinse, gargle, and spit after use. Patient taking differently: Inhale 1-2 puffs into the lungs 2 (two) times daily as needed (shortness of breath). Rinse, gargle, and spit after use. 01/12/19   Kozlow, Donnamarie Poag, MD  Coenzyme Q10 (COQ-10) 150 MG CAPS Take 150 mg by mouth daily.    [provider]  fluticasone (FLONASE) 50 MCG/ACT nasal spray Use  1-2 sprays in each nostril as needed 01/12/19   Kozlow, Donnamarie Poag, MD  melatonin 5 MG TABS Take 5 mg by mouth at bedtime as needed (sleep).    [provider]     Review of Systems  Positive ROS: As above  All other systems have been reviewed and were otherwise negative with the exception of those mentioned in the HPI and as above.  Objective: Vital signs in last 24 hours: Temp:  [97.5 F (36.4 C)] 97.5 F (36.4 C) (10/24 0828) Pulse Rate:  [72] 72 (10/24 0828) Resp:  [17] 17 (10/24 0828) BP: (134)/(88) 134/88 (10/24 0828) SpO2:  [97 %] 97 % (10/24 0828) Weight:  [78.5 kg] 78.5 kg (10/24 0828) Estimated body mass index is 26.3 kg/m as calculated from the following:   Height as of this encounter: 5\' 8"  (1.727 m).   Weight as of this encounter: 78.5 kg.   General Appearance: Alert Head: Normocephalic, without obvious abnormality, atraumatic Eyes: PERRL, conjunctiva/corneas clear, EOM's intact,    Ears: Normal  Throat: Normal  Neck: Supple, Back: unremarkable Lungs: Clear to auscultation bilaterally, respirations unlabored Heart: Regular rate and rhythm, no murmur, rub or gallop Abdomen: Soft, non-tender Extremities: Extremities normal, atraumatic, no cyanosis or edema Skin: unremarkable  NEUROLOGIC:   Mental status: alert and oriented,Motor Exam - grossly normal Sensory Exam - grossly normal Reflexes:  Coordination - grossly normal Gait - grossly normal Balance - grossly normal Cranial Nerves: I: smell Not tested  II: visual acuity  OS: Normal  OD: Normal   II: visual fields Full to confrontation  II: pupils Equal, round, reactive to Schwoerer  III,VII: ptosis None  III,IV,VI: extraocular muscles  Full ROM  V: mastication Normal  V: facial Janney touch sensation  Normal  V,VII: corneal reflex  Present  VII: facial muscle function - upper  Normal  VII: facial muscle function - lower Normal  VIII: hearing Not tested  IX: soft palate elevation  Normal  IX,X:  gag reflex Present  XI: trapezius strength  5/5  XI: sternocleidomastoid strength 5/5  XI: neck flexion strength  5/5  XII: tongue strength  Normal    Data Review Lab Results  Component Value Date   WBC 6.3 06/21/2021   HGB 16.6 06/21/2021   HCT 49.4 06/21/2021   MCV 92.7 06/21/2021   PLT 225 06/21/2021   Lab Results  Component Value Date   NA 141 06/21/2021   K 4.2 06/21/2021   CL 110 06/21/2021   CO2 25 06/21/2021   BUN 10 06/21/2021   CREATININE 1.29 (H) 06/21/2021   GLUCOSE 128 (H) 06/21/2021   No results found for: INR, PROTIME  Assessment/Plan: Lumbar spinal stasis, lumbar spinal stenosis, lumbago, lumbar radiculopathy, neurogenic claudication: I have discussed the situation with the patient.  I have reviewed his imaging studies with him and  pointed out the abnormalities.  We have discussed the various treatment options including surgery.  I have described the surgical treatment option of at L2-3, L3-4 and L4-5 laminectomy with instrumentation and fusion at L4-5.  I have shown him surgical models.  I have given him a surgical pamphlet.  We have discussed the risk, benefits, alternatives, expected postoperative course, and likelihood of achieving our goals with surgery.  I have answered all his questions.  He has decided proceed with surgery.   Ophelia Charter 06/25/2021 9:17 AM

## 2021-06-25 NOTE — Op Note (Signed)
Brief history: The patient is a 70 year old white male who is complained of back and bilateral leg pain consistent with neurogenic claudication.  He has failed medical management.  He was worked up with a lumbar myelo CT which demonstrated multilevel lumbar spinal stenosis and 4 5 spondylolisthesis.  I discussed the various treatment options with him.  He has decided proceed with surgery.  Preoperative diagnosis: Lumbar spondylolisthesis, lumbar degenerative disc disease, lumbar spinal stenosis compressing L3, L4 and L5 nerve roots; lumbago; lumbar radiculopathy; neurogenic claudication  Postoperative diagnosis: The same  Procedure: Bilateral L2-3, L3-4 and L4-5 laminotomy/foraminotomies/medial facetectomy to decompress the bilateral L3, L4 and L5 nerve roots(the work required to do this was in addition to the work required to do the posterior lumbar interbody fusion because of the patient's spinal stenosis, facet arthropathy. Etc. requiring a wide decompression of the nerve roots.);  L4-5 transforaminal lumbar interbody fusion with local morselized autograft bone and Zimmer DBM; insertion of interbody prosthesis at L4-5 (globus peek expandable interbody prosthesis); posterior nonsegmental instrumentation from L4 to L5 with globus titanium pedicle screws and rods; posterior lateral arthrodesis at L4-5 with local morselized autograft bone and Zimmer DBM.  Surgeon: Dr. Earle Gell  Asst.: Arnetha Massy, NP  Anesthesia: Gen. endotracheal  Estimated blood loss: 250 cc  Drains: 1 medium Hemovac  Complications: None  Description of procedure: The patient was brought to the operating room by the anesthesia team. General endotracheal anesthesia was induced. The patient was turned to the prone position on the Wilson frame. The patient's lumbosacral region was then prepared with Betadine scrub and Betadine solution. Sterile drapes were applied.  I then injected the area to be incised with Marcaine with  epinephrine solution. I then used the scalpel to make a linear midline incision over the L2-3, L3-4 and L4-5 interspace. I then used electrocautery to perform a bilateral subperiosteal dissection exposing the spinous process and lamina of L2-3, L3-4 and L4-5. We then obtained intraoperative radiograph to confirm our location. We then inserted the Verstrac retractor to provide exposure.  I began the decompression by using the high speed drill to perform laminotomies at L2-3, L3-4, and L4-5 on the right. We then used the Kerrison punches to widen the laminotomy and removed the ligamentum flavum at right L2-3, L3-4 and L4-5. We used the Kerrison punches to remove the medial facets at L2-3, L3-4 and L4-5.  I used a high-speed drill to drill across the midline to drill away the medial left L2-3, L3-4 and L4-5 lamina.  I reached across the midline with a Kerrison punch and remove the ligamentum flavum decompressing the left L3, L4 and L5 nerve roots we performed wide foraminotomies about the bilateral L3, L4 and L5 nerve roots completing the decompression.  We now turned our attention to the posterior lumbar interbody fusion. I used a scalpel to incise the intervertebral disc at L4-5 bilaterally. I then performed a partial intervertebral discectomy at L4-5 bilaterally using the pituitary forceps. We prepared the vertebral endplates at M7-6 bilaterally for the fusion by removing the soft tissues with the curettes. We then used the trial spacers to pick the appropriate sized interbody prosthesis. We prefilled his prosthesis with a combination of local morselized autograft bone that we obtained during the decompression as well as Zimmer DBM. We inserted the prefilled prosthesis into the interspace at L4-5 from the right, we then turned and expanded the prosthesis. There was a good snug fit of the prosthesis in the interspace. We then filled and the  remainder of the intervertebral disc space with local morselized  autograft bone and Zimmer DBM. This completed the posterior lumbar interbody arthrodesis.  During the decompression and insertion of the prosthesis the assistant protected the thecal sac and nerve roots with the D'Errico retractor.  We now turned attention to the instrumentation. Under fluoroscopic guidance we cannulated the bilateral L4 and L5 pedicles with the bone probe. We then removed the bone probe. We then tapped the pedicle with a 6.5 millimeter tap. We then removed the tap. We probed inside the tapped pedicle with a ball probe to rule out cortical breaches. We then inserted a 7.5 x 50 and 55 millimeter pedicle screw into the L4 and L5 pedicles bilaterally under fluoroscopic guidance. We then palpated along the medial aspect of the pedicles to rule out cortical breaches. There were none. The nerve roots were not injured. We then connected the unilateral pedicle screws with a lordotic rod. We compressed the construct and secured the rod in place with the caps. We then tightened the caps appropriately. This completed the instrumentation from L4-5 bilaterally.  We now turned our attention to the posterior lateral arthrodesis at L4-5 bilaterally. We used the high-speed drill to decorticate the remainder of the facets, pars, transverse process at L4-5 bilaterally. We then applied a combination of local morselized autograft bone and Zimmer DBM over these decorticated posterior lateral structures. This completed the posterior lateral arthrodesis.  We then obtained hemostasis using bipolar electrocautery. We irrigated the wound out with bacitracin solution. We inspected the thecal sac and nerve roots and noted they were well decompressed. We then removed the retractor.  We injected Exparel .  We placed a medium Hemovac drain in the epidural space and tunneled it out through a separate stab wound.  We reapproximated patient's thoracolumbar fascia with interrupted #1 Vicryl suture. We reapproximated patient's  subcutaneous tissue with interrupted 2-0 Vicryl suture. The reapproximated patient's skin with Steri-Strips and benzoin. The wound was then coated with bacitracin ointment. A sterile dressing was applied. The drapes were removed. The patient was subsequently returned to the supine position where they were extubated by the anesthesia team. He was then transported to the post anesthesia care unit in stable condition. All sponge instrument and needle counts were reportedly correct at the end of this case.

## 2021-06-25 NOTE — Transfer of Care (Signed)
Immediate Anesthesia Transfer of Care Note  Patient: Eric Cortez  Procedure(s) Performed: Posterior Lumbar Interbody Fusion , Instrumented Prothesis, Posterior Instrumentation Lumbar four-five; Lumbar two-three, Lumbar three-four Laminectomy ,Foraminotomy  Patient Location: PACU  Anesthesia Type:General  Level of Consciousness: awake, alert  and oriented  Airway & Oxygen Therapy: Patient connected to face mask oxygen  Post-op Assessment: Post -op Vital signs reviewed and stable  Post vital signs: stable  Last Vitals:  Vitals Value Taken Time  BP 135/86 06/25/21 1454  Temp    Pulse 88 06/25/21 1455  Resp 19 06/25/21 1455  SpO2 97 % 06/25/21 1455  Vitals shown include unvalidated device data.  Last Pain:  Vitals:   06/25/21 0837  TempSrc:   PainSc: 0-No pain         Complications: No notable events documented.

## 2021-06-25 NOTE — Anesthesia Procedure Notes (Addendum)
Procedure Name: Intubation Date/Time: 06/25/2021 10:11 AM Performed by: Glynda Jaeger, CRNA Pre-anesthesia Checklist: Patient identified, Patient being monitored, Timeout performed, Emergency Drugs available and Suction available Patient Re-evaluated:Patient Re-evaluated prior to induction Oxygen Delivery Method: Circle System Utilized Preoxygenation: Pre-oxygenation with 100% oxygen Induction Type: IV induction Ventilation: Mask ventilation without difficulty Laryngoscope Size: Mac and 4 Grade View: Grade I Tube type: Oral Tube size: 7.5 mm Number of attempts: 1 Airway Equipment and Method: Stylet Placement Confirmation: ETT inserted through vocal cords under direct vision, positive ETCO2 and breath sounds checked- equal and bilateral Secured at: 23 cm Tube secured with: Tape Dental Injury: Teeth and Oropharynx as per pre-operative assessment

## 2021-06-25 NOTE — Progress Notes (Signed)
Orthopedic Tech Progress Note Patient Details:  Eric Cortez November 25, 1950 [id number]  RN stated " patient has back brace"   Patient ID: Eric Cortez, male   DOB: 09-Dec-1950, 70 y.o.   MRN: [id number]  Janit Pagan 06/25/2021, 6:34 PM

## 2021-06-26 LAB — CBC
HCT: 41.9 % (ref 39.0–52.0)
Hemoglobin: 14.2 g/dL (ref 13.0–17.0)
MCH: 31.3 pg (ref 26.0–34.0)
MCHC: 33.9 g/dL (ref 30.0–36.0)
MCV: 92.3 fL (ref 80.0–100.0)
Platelets: 197 10*3/uL (ref 150–400)
RBC: 4.54 MIL/uL (ref 4.22–5.81)
RDW: 13 % (ref 11.5–15.5)
WBC: 14.6 10*3/uL — ABNORMAL HIGH (ref 4.0–10.5)
nRBC: 0 % (ref 0.0–0.2)

## 2021-06-26 LAB — BASIC METABOLIC PANEL
Anion gap: 7 (ref 5–15)
BUN: 16 mg/dL (ref 8–23)
CO2: 24 mmol/L (ref 22–32)
Calcium: 8.9 mg/dL (ref 8.9–10.3)
Chloride: 107 mmol/L (ref 98–111)
Creatinine, Ser: 1.24 mg/dL (ref 0.61–1.24)
GFR, Estimated: >60 mL/min (ref >60)
Glucose, Bld: 138 mg/dL — ABNORMAL HIGH (ref 70–99)
Potassium: 4.5 mmol/L (ref 3.5–5.1)
Sodium: 138 mmol/L (ref 135–145)

## 2021-06-26 MED ORDER — OXYCODONE-ACETAMINOPHEN 5-325 MG PO TABS
1.0000 | ORAL_TABLET | ORAL | Status: DC | PRN
  Administered 2021-06-26: 2 via ORAL
  Filled 2021-06-26: qty 2

## 2021-06-26 MED ORDER — OXYCODONE-ACETAMINOPHEN 5-325 MG PO TABS: 1.0000 | ORAL_TABLET | ORAL | 0 refills | Status: DC | PRN

## 2021-06-26 MED ORDER — INFLUENZA VAC A&B SA ADJ QUAD 0.5 ML IM PRSY
0.5000 mL | PREFILLED_SYRINGE | INTRAMUSCULAR | Status: AC
  Administered 2021-06-26: 0.5 mL via INTRAMUSCULAR
  Filled 2021-06-26: qty 0.5

## 2021-06-26 MED ORDER — CYCLOBENZAPRINE HCL 5 MG PO TABS: 5.0000 mg | ORAL_TABLET | Freq: Three times a day (TID) | ORAL | 0 refills | Status: DC | PRN

## 2021-06-26 MED ORDER — CYCLOBENZAPRINE HCL 5 MG PO TABS: 5.0000 mg | ORAL_TABLET | Freq: Three times a day (TID) | ORAL | Status: DC | PRN

## 2021-06-26 MED ORDER — SUGAMMADEX SODIUM 200 MG/2ML IV SOLN
INTRAVENOUS | Status: DC | PRN
  Administered 2021-06-25: 200 mg via INTRAVENOUS

## 2021-06-26 MED ORDER — DOCUSATE SODIUM 100 MG PO CAPS: 100.0000 mg | ORAL_CAPSULE | Freq: Two times a day (BID) | ORAL | 0 refills | Status: DC

## 2021-06-26 MED FILL — Thrombin For Soln 5000 Unit: CUTANEOUS | Qty: 5000 | Status: AC

## 2021-06-26 NOTE — Evaluation (Signed)
Physical Therapy Evaluation and Discharge Patient Details Name: Eric Cortez MRN: [id number] DOB: 14-Sep-1950 Today's Date: 06/26/2021  History of Present Illness  Pt is 70 y/o M admitted s/p L2-3, L3-4 and L4-5 laminotomy/foraminotomy with TLIF L4-5 . PMH includes CAD, MI, HTN.   Clinical Impression  Patient evaluated by Physical Therapy with no further acute PT needs identified. All education has been completed and the patient has no further questions. Pt was able to demonstrate transfers and ambulation with gross modified independence and supervision for safety on the stairs. Pt was educated on precautions, brace application/wearing schedule, appropriate activity progression, and car transfer. See below for any follow-up Physical Therapy or equipment needs. PT is signing off. Thank you for this referral.       Recommendations for follow up therapy are one component of a multi-disciplinary discharge planning process, led by the attending physician.  Recommendations may be updated based on patient status, additional functional criteria and insurance authorization.  Follow Up Recommendations No PT follow up    Assistance Recommended at Discharge PRN  Functional Status Assessment Patient has had a recent decline in their functional status and demonstrates the ability to make significant improvements in function in a reasonable and predictable amount of time.  Equipment Recommendations  None recommended by PT    Recommendations for Other Services       Precautions / Restrictions Precautions Precautions: Back Required Braces or Orthoses: Spinal Brace Spinal Brace: Lumbar corset;Applied in sitting position Restrictions Weight Bearing Restrictions: No      Mobility  Bed Mobility Overal bed mobility: Modified Independent             General bed mobility comments: HOB flat and rails lowered to simulate home environment. No assist required.    Transfers Overall transfer level:  Modified independent Equipment used: None Transfers: Sit to/from Stand Sit to Stand: Supervision           General transfer comment: Pt was able to power-up to full stand without assistance. No unsteadiness or LOB noted.    Ambulation/Gait Ambulation/Gait assistance: Modified independent (Device/Increase time) Gait Distance (Feet): 560 Feet Assistive device: None Gait Pattern/deviations: Step-through pattern;Decreased stride length;Trunk flexed Gait velocity: Decreased Gait velocity interpretation: 1.31 - 2.62 ft/sec, indicative of limited community ambulator General Gait Details: VC's for improved posture. Overall ambulating well with mildly decreased gait speed.  Stairs Stairs: Yes Stairs assistance: Supervision Stair Management: One rail Right;Step to pattern;Forwards Number of Stairs: 10 General stair comments: VC's for sequencing and general safety.  Wheelchair Mobility    Modified Rankin (Stroke Patients Only)       Balance Overall balance assessment: Mild deficits observed, not formally tested                                           Pertinent Vitals/Pain Pain Assessment: Faces Pain Score: 1  Faces Pain Scale: Hurts a little bit Pain Location: back Pain Descriptors / Indicators: Sore Pain Intervention(s): Limited activity within patient's tolerance;Monitored during session;Repositioned    Home Living Family/patient expects to be discharged to:: Private residence Living Arrangements: Spouse/significant other Available Help at Discharge: Family;Available 24 hours/day Type of Home: House Home Access: Stairs to enter Entrance Stairs-Rails: Right Entrance Stairs-Number of Steps: 3 Alternate Level Stairs-Number of Steps: flight Home Layout: Two level Home Equipment: Rollator (4 wheels)      Prior Function Prior Level of  Function : Independent/Modified Independent;Driving                     Hand Dominance         Extremity/Trunk Assessment   Upper Extremity Assessment Upper Extremity Assessment: Defer to OT evaluation    Lower Extremity Assessment Lower Extremity Assessment: Generalized weakness (Mild; consistent with pre-op diagnosis)    Cervical / Trunk Assessment Cervical / Trunk Assessment: Back Surgery  Communication   Communication: No difficulties  Cognition Arousal/Alertness: Awake/alert Behavior During Therapy: WFL for tasks assessed/performed Overall Cognitive Status: Within Functional Limits for tasks assessed                                          General Comments      Exercises     Assessment/Plan    PT Assessment Patient does not need any further PT services  PT Problem List         PT Treatment Interventions      PT Goals (Current goals can be found in the Care Plan section)  Acute Rehab PT Goals Patient Stated Goal: Home today PT Goal Formulation: All assessment and education complete, DC therapy    Frequency     Barriers to discharge        Co-evaluation               AM-PAC PT "6 Clicks" Mobility  Outcome Measure Help needed turning from your back to your side while in a flat bed without using bedrails?: None Help needed moving from lying on your back to sitting on the side of a flat bed without using bedrails?: None Help needed moving to and from a bed to a chair (including a wheelchair)?: None Help needed standing up from a chair using your arms (e.g., wheelchair or bedside chair)?: None Help needed to walk in hospital room?: None Help needed climbing 3-5 steps with a railing? : A Little 6 Click Score: 23    End of Session Equipment Utilized During Treatment: Back brace;Gait belt Activity Tolerance: Patient tolerated treatment well Patient left: in bed;with call bell/phone within reach;with family/visitor present Nurse Communication: Mobility status PT Visit Diagnosis: Unsteadiness on feet (R26.81);Pain Pain - part  of body:  (back)    Time: 0825-0900 PT Time Calculation (min) (ACUTE ONLY): 35 min   Charges:   PT Evaluation $PT Eval Low Complexity: 1 Low PT Treatments $Gait Training: 8-22 mins        Rolinda Roan, PT, DPT Acute Rehabilitation Services Pager: [phone] Office: [phone]   Thelma Comp 06/26/2021, 9:55 AM

## 2021-06-26 NOTE — Discharge Summary (Signed)
Physician Discharge Summary  Patient ID: Eric Cortez MRN: [id number] DOB/AGE: 71-Apr-1952 71 y.o.  Admit date: 06/25/2021 Discharge date: 06/26/2021  Admission Diagnoses: Lumbar spinal stasis, lumbar spinal stenosis, lumbar radiculopathy, lumbago, neurogenic claudication  Discharge Diagnoses: The same Active Problems:   Spondylolisthesis, lumbar region   Discharged Condition: good  Hospital Course: I performed an L2-3, L3-4 and L4-5 laminotomy/foraminotomy with instrumentation fusion L4-5 on the patient on 06/25/2021.  The surgery went well.  The patient's postoperative course was unremarkable.  On postoperative day #1 the patient requested discharge to home.  He requested a flu shot.  The patient, and his wife, were given verbal and written discharge instructions.  All her questions were answered.  Consults: PT, OT, care management Significant Diagnostic Studies: None Treatments: L2-3, L3-4 and L4-5 laminotomy/foraminotomy; instrumentation and fusion L4-5 Discharge Exam: Blood pressure 107/67, pulse 73, temperature 98.5 F (36.9 C), temperature source Oral, resp. rate 16, height 5\' 8"  (1.727 m), weight 78.5 kg, SpO2 94 %. The patient is alert and pleasant.  He looks well.  His strength is normal.  Disposition: Home  Discharge Instructions     Call MD for:  difficulty breathing, headache or visual disturbances   Complete by: As directed    Call MD for:  extreme fatigue   Complete by: As directed    Call MD for:  hives   Complete by: As directed    Call MD for:  persistant dizziness or Slaubaugh-headedness   Complete by: As directed    Call MD for:  persistant nausea and vomiting   Complete by: As directed    Call MD for:  redness, tenderness, or signs of infection (pain, swelling, redness, odor or green/yellow discharge around incision site)   Complete by: As directed    Call MD for:  severe uncontrolled pain   Complete by: As directed    Call MD for:  temperature >100.4    Complete by: As directed    Diet - low sodium heart healthy   Complete by: As directed    Discharge instructions   Complete by: As directed    Call [phone] for a followup appointment. Take a stool softener while you are using pain medications.   Driving Restrictions   Complete by: As directed    Do not drive for 2 weeks.   Increase activity slowly   Complete by: As directed    Lifting restrictions   Complete by: As directed    Do not lift more than 5 pounds. No excessive bending or twisting.   May shower / Bathe   Complete by: As directed    Remove the dressing for 3 days after surgery.  You may shower, but leave the incision alone.   Remove dressing in 48 hours   Complete by: As directed       Allergies as of 06/26/2021   No Known Allergies      Medication List     STOP taking these medications    HYDROcodone-acetaminophen 5-325 MG tablet Commonly known as: NORCO/VICODIN       TAKE these medications    albuterol 108 (90 Base) MCG/ACT inhaler Commonly known as: VENTOLIN HFA Inhale 2 puffs into the lungs every 6 (six) hours as needed for wheezing or shortness of breath.   albuterol (2.5 MG/3ML) 0.083% nebulizer solution Commonly known as: PROVENTIL Use one vial in nebulizer every four to six hours as needed for cough or wheeze.   alfuzosin 10 MG 24 hr tablet Commonly  known as: UROXATRAL Take 1 tablet (10 mg total) by mouth daily.   aspirin EC 81 MG tablet Take 1 tablet (81 mg total) by mouth daily.   atorvastatin 20 MG tablet Commonly known as: LIPITOR Take 1 tablet by mouth once daily   budesonide 180 MCG/ACT inhaler Commonly known as: PULMICORT Inhale two doses twice daily during flare-up as directed. Rinse, gargle, and spit after use. What changed:  how much to take how to take this when to take this reasons to take this additional instructions   buPROPion 150 MG 24 hr tablet Commonly known as: WELLBUTRIN XL Take 150 mg by mouth daily.    CoQ-10 150 MG Caps Take 150 mg by mouth daily.   cyclobenzaprine 5 MG tablet Commonly known as: FLEXERIL Take 1 tablet (5 mg total) by mouth 3 (three) times daily as needed for muscle spasms.   docusate sodium 100 MG capsule Commonly known as: COLACE Take 1 capsule (100 mg total) by mouth 2 (two) times daily.   EPINEPHrine 0.3 mg/0.3 mL Soaj injection Commonly known as: EPI-PEN Use for life-threatening allergic reactions   fluticasone 50 MCG/ACT nasal spray Commonly known as: Flonase Use 1-2 sprays in each nostril as needed   melatonin 5 MG Tabs Take 5 mg by mouth at bedtime as needed (sleep).   nitroGLYCERIN 0.4 MG SL tablet Commonly known as: NITROSTAT Place 1 tablet (0.4 mg total) under the tongue every 5 (five) minutes x 3 doses as needed for chest pain.   omalizumab 150 MG/ML prefilled syringe Commonly known as: Xolair Inject 300 mg into the skin every 28 (twenty-eight) days.   oxyCODONE-acetaminophen 5-325 MG tablet Commonly known as: PERCOCET/ROXICET Take 1-2 tablets by mouth every 4 (four) hours as needed for moderate pain.         Signed: Ophelia Charter 06/26/2021, 7:41 AM

## 2021-06-26 NOTE — Evaluation (Signed)
Occupational Therapy Evaluation Patient Details Name: Eric Cortez MRN: [id number] DOB: [birth year] Today's Date: 06/26/2021   History of Present Illness Pt is 70 y/o M admitted 06/25/21 with back and bilateral leg pain consistent with neurogenic claudication. S/p L2-3, L3-4 and L4-5 laminotomy/foraminotomy with instrumentation fusion L4-5. PMH includes CAD, MI, HTN.   Clinical Impression   Pt presents with decreased balance and back precautions. Pt currently requiring supervision with LB bathing and functional transfers/mobility. Pt reports being independent at baseline and lives with spouse will be available to provide assistance as needed at home. Pt educated on back precautions and compensatory strategies for ADLs. Pt should be safe to return home without any further skilled OT services once medically cleared. Will sign off.     Recommendations for follow up therapy are one component of a multi-disciplinary discharge planning process, led by the attending physician.  Recommendations may be updated based on patient status, additional functional criteria and insurance authorization.   Follow Up Recommendations  No OT follow up    Assistance Recommended at Discharge Intermittent Supervision/Assistance  Functional Status Assessment  Patient has had a recent decline in their functional status and demonstrates the ability to make significant improvements in function in a reasonable and predictable amount of time.  Equipment Recommendations  None recommended by OT    Recommendations for Other Services       Precautions / Restrictions Precautions Precautions: Back Required Braces or Orthoses: Spinal Brace Spinal Brace: Lumbar corset;Applied in sitting position Restrictions Weight Bearing Restrictions: No      Mobility Bed Mobility                    Transfers Overall transfer level: Needs assistance Equipment used: None Transfers: Sit to/from Stand Sit to Stand:  Supervision                  Balance Overall balance assessment: Mild deficits observed, not formally tested                                         ADL either performed or assessed with clinical judgement   ADL Overall ADL's : Needs assistance/impaired Eating/Feeding: Independent   Grooming: Independent;Standing   Upper Body Bathing: Independent;Sitting;Standing   Lower Body Bathing: Supervison/ safety;Sit to/from stand;Sitting/lateral leans   Upper Body Dressing : Independent;Sitting   Lower Body Dressing: Independent;Sitting/lateral leans;Sit to/from stand   Toilet Transfer: Supervision/safety;Ambulation;Regular Toilet   Toileting- Water quality scientist and Hygiene: Independent;Sit to/from stand;Sitting/lateral lean     Tub/Shower Transfer Details (indicate cue type and reason): Pt reports that he has only a tub at home with no shower. Recommended sponge bathing at sink at this time. Pt in agreement. Functional mobility during ADLs: Supervision/safety General ADL Comments: Pt limited by balance and back precautions.     Vision   Vision Assessment?: No apparent visual deficits     Perception     Praxis      Pertinent Vitals/Pain Pain Assessment: 0-10 Pain Score: 1  Pain Location: back Pain Descriptors / Indicators: Sore Pain Intervention(s): Monitored during session;Repositioned     Hand Dominance     Extremity/Trunk Assessment Upper Extremity Assessment Upper Extremity Assessment: Overall WFL for tasks assessed   Lower Extremity Assessment Lower Extremity Assessment: Defer to PT evaluation   Cervical / Trunk Assessment Cervical / Trunk Assessment: Back Surgery   Communication Communication Communication: No difficulties  Cognition Arousal/Alertness: Awake/alert Behavior During Therapy: WFL for tasks assessed/performed Overall Cognitive Status: Within Functional Limits for tasks assessed                                        General Comments       Exercises     Shoulder Instructions      Home Living Family/patient expects to be discharged to:: Private residence Living Arrangements: Spouse/significant other Available Help at Discharge: Family;Available 24 hours/day               Bathroom Shower/Tub: Tub only   Biochemist, clinical: Standard     Home Equipment: Rollator (4 wheels)          Prior Functioning/Environment Prior Level of Function : Independent/Modified Independent;Driving                        OT Problem List: Impaired balance (sitting and/or standing);Decreased knowledge of precautions      OT Treatment/Interventions:      OT Goals(Current goals can be found in the care plan section) Acute Rehab OT Goals Patient Stated Goal: return home OT Goal Formulation: With patient/family  OT Frequency:     Barriers to D/C:            Co-evaluation              AM-PAC OT "6 Clicks" Daily Activity     Outcome Measure Help from another person eating meals?: None Help from another person taking care of personal grooming?: None Help from another person toileting, which includes using toliet, bedpan, or urinal?: A Little Help from another person bathing (including washing, rinsing, drying)?: A Little Help from another person to put on and taking off regular upper body clothing?: None Help from another person to put on and taking off regular lower body clothing?: None 6 Click Score: 22   End of Session Equipment Utilized During Treatment: Back brace Nurse Communication: Mobility status  Activity Tolerance: Patient tolerated treatment well Patient left: in bed;with call bell/phone within reach;with family/visitor present  OT Visit Diagnosis: Unsteadiness on feet (R26.81);Other abnormalities of gait and mobility (R26.89)                Time: 9449-6759 OT Time Calculation (min): 15 min Charges:  OT General Charges $OT Visit: 1 Visit OT  Evaluation $OT Eval Low Complexity: 1 Low  Chariti Havel C, OT/L  Acute Rehab [place] 06/26/2021, 9:29 AM

## 2021-06-26 NOTE — Anesthesia Postprocedure Evaluation (Signed)
Anesthesia Post Note  Patient: Eric Cortez  Procedure(s) Performed: Posterior Lumbar Interbody Fusion , Instrumented Prothesis, Posterior Instrumentation Lumbar four-five; Lumbar two-three, Lumbar three-four Laminectomy ,Foraminotomy     Patient location during evaluation: PACU Anesthesia Type: General Level of consciousness: awake and alert Pain management: pain level controlled Vital Signs Assessment: post-procedure vital signs reviewed and stable Respiratory status: spontaneous breathing, nonlabored ventilation, respiratory function stable and patient connected to nasal cannula oxygen Cardiovascular status: blood pressure returned to baseline and stable Postop Assessment: no apparent nausea or vomiting Anesthetic complications: no   No notable events documented.  Last Vitals:  Vitals:   06/26/21 0358 06/26/21 0740  BP: 109/60 107/67  Pulse: 66 73  Resp: 18 16  Temp: 36.7 C 36.9 C  SpO2: 97% 94%    Last Pain:  Vitals:   06/26/21 1010  TempSrc:   PainSc: 7    Pain Goal: Patients Stated Pain Goal: 3 (06/26/21 1010)                 Barnet Glasgow

## 2021-06-26 NOTE — Plan of Care (Signed)
Pt and wife given D/C instructions with verbal understanding. Rx's were sent to the pharmacy by MD. Pt's incision is clean and dry with no sign of infection. Pt's IV was removed prior to D/C. Pt D/C'd home via wheelchair per MD order. Pt is stable @ D/C and has no other needs at this time. Holli Humbles, RN

## 2021-06-28 MED FILL — Heparin Sodium (Porcine) Inj 1000 Unit/ML: INTRAMUSCULAR | Qty: 30 | Status: AC

## 2021-06-28 MED FILL — Sodium Chloride IV Soln 0.9%: INTRAVENOUS | Qty: 1000 | Status: AC

## 2021-08-16 ENCOUNTER — Telehealth: Payer: Self-pay | Admitting: Allergy & Immunology

## 2021-08-16 ENCOUNTER — Other Ambulatory Visit: Payer: Self-pay | Admitting: Allergy and Immunology

## 2021-08-16 MED ORDER — ALBUTEROL SULFATE HFA 108 (90 BASE) MCG/ACT IN AERS: 2.0000 | INHALATION_SPRAY | Freq: Four times a day (QID) | RESPIRATORY_TRACT | 0 refills | Status: DC | PRN

## 2021-08-16 MED ORDER — BUDESONIDE 180 MCG/ACT IN AEPB: 1.0000 | INHALATION_SPRAY | Freq: Two times a day (BID) | RESPIRATORY_TRACT | 0 refills | Status: DC | PRN

## 2021-08-16 MED ORDER — FLUTICASONE PROPIONATE 50 MCG/ACT NA SUSP: NASAL | 0 refills | Status: DC

## 2021-08-16 NOTE — Telephone Encounter (Signed)
Sending courtesy refills for Flonase, Pulmicort, and Ventolin into the [facility] in [place]

## 2021-08-16 NOTE — Telephone Encounter (Signed)
Eric Cortez called in and states he needs refills on FLONASE, PULMICORT, and VENTOLIN and requested 90 days.  I informed patient he is due for an appointment since it has been 2 years since he was seen.  I told him that we could send in a courtesy which would be a 30 days supply until he was seen.  Patient made an appointment for January 27th (first available) with Dr. Ernst Bowler.  Eric Cortez would like the Flonase, Pulmicort, and ventolin sent to [facility] in [place].

## 2021-09-06 ENCOUNTER — Other Ambulatory Visit: Payer: Self-pay | Admitting: *Deleted

## 2021-09-06 MED ORDER — OMALIZUMAB 150 MG/ML ~~LOC~~ SOSY: 300.0000 mg | PREFILLED_SYRINGE | SUBCUTANEOUS | 11 refills | Status: DC

## 2021-09-28 ENCOUNTER — Ambulatory Visit (INDEPENDENT_AMBULATORY_CARE_PROVIDER_SITE_OTHER): Payer: Medicare PPO | Admitting: Family

## 2021-09-28 ENCOUNTER — Other Ambulatory Visit: Payer: Self-pay

## 2021-09-28 ENCOUNTER — Encounter: Payer: Self-pay | Admitting: Family

## 2021-09-28 VITALS — BP 140/86 | HR 71 | Temp 97.2°F | Resp 18 | Ht 68.0 in | Wt 175.0 lb

## 2021-09-28 DIAGNOSIS — Z91018 Allergy to other foods: Secondary | ICD-10-CM

## 2021-09-28 DIAGNOSIS — L501 Idiopathic urticaria: Secondary | ICD-10-CM | POA: Diagnosis not present

## 2021-09-28 DIAGNOSIS — J4520 Mild intermittent asthma, uncomplicated: Secondary | ICD-10-CM

## 2021-09-28 DIAGNOSIS — J3089 Other allergic rhinitis: Secondary | ICD-10-CM

## 2021-09-28 MED ORDER — EPINEPHRINE 0.3 MG/0.3ML IJ SOAJ: INTRAMUSCULAR | 3 refills | Status: DC

## 2021-09-28 MED ORDER — AZELASTINE HCL 0.1 % NA SOLN: 1.0000 | Freq: Two times a day (BID) | NASAL | 3 refills | Status: DC

## 2021-09-28 MED ORDER — BUDESONIDE 180 MCG/ACT IN AEPB: 2.0000 | INHALATION_SPRAY | Freq: Two times a day (BID) | RESPIRATORY_TRACT | 1 refills | Status: DC | PRN

## 2021-09-28 MED ORDER — ALBUTEROL SULFATE HFA 108 (90 BASE) MCG/ACT IN AERS: 2.0000 | INHALATION_SPRAY | Freq: Four times a day (QID) | RESPIRATORY_TRACT | 0 refills | Status: DC | PRN

## 2021-09-28 MED ORDER — ALBUTEROL SULFATE (2.5 MG/3ML) 0.083% IN NEBU: 2.5000 mg | INHALATION_SOLUTION | RESPIRATORY_TRACT | 1 refills | Status: DC | PRN

## 2021-09-28 MED ORDER — FLUTICASONE PROPIONATE 50 MCG/ACT NA SUSP: 2.0000 | Freq: Every day | NASAL | 1 refills | Status: DC

## 2021-09-28 NOTE — Progress Notes (Addendum)
[address] [place] [address] Dept: [phone]  FOLLOW UP NOTE  Patient ID: Eric Cortez, male    DOB: July 24, 1951  Age: 71 y.o. MRN: [id number] Date of Office Visit: 09/28/2021  Assessment  Chief Complaint: Urticaria (Says he is well. Went to acupuncture and in [place] and says that it took care of the hives. The Xolair is helping as well. )  HPI [name] a 71 year old male who presents today for follow-up of urticaria, mild intermittent asthma, allergy with anaphylaxis due to food, and perennial allergic rhinitis.  He was last seen on 06/2019 by Dr. Neldon Mc.  Since his last office visit he reports that he had back surgery in October 2022.  Urticaria is reported as controlled with Xolair injections every 4 weeks.  He denies any problems or reactions to the Xolair injections.  He does think that his EpiPen is probably 72 years old.  He has not had any hives at least a month or so before starting Xolair.  He reports that he went to [place] and had acupuncture in his ear and that this did a world of good.  Then a month later he started the Xolair injections.  He only takes Zyrtec 10 mg once a day for allergy symptoms.  He reports that he is now able to eat all foods and not avoiding any foods.  He started out eating mammalian meats gradually and is now able to eat anything without a reaction.  Mild intermittent asthma is reported as controlled with Pulmicort as needed and albuterol as needed.  He denies coughing, wheezing, tightness in his chest, shortness of breath, and nocturnal awakenings due to breathing problems.  Since his last office visit he denies any trips to the emergency room or urgent care due to breathing problems and he has not required any systemic steroids due to breathing problems.  He has not had to use his albuterol inhaler since we last saw him.  He has tried using his Pulmicort with the sinus drainage he has been having.  Instructed  him that the Pulmicort would really not do much to help with the sinus drainage.  Perennial allergic rhinitis is reported as moderately controlled with Zyrtec as needed and Flonase as needed.  He reports during the months of December through February he usually has postnasal drip.  This started around Christmas or the first of the year.  He also reports clear rhinorrhea when he is outside in the cold.  He denies nasal congestion.  He has not had any sinus infections since we last saw him.  He does use saline rinses sometimes and this does help with the postnasal drip.   Drug Allergies:  No Known Allergies  Review of Systems: Review of Systems  Constitutional:  Negative for chills and fever.  HENT:         Reports postnasal drip the months of December through February.  Reports clear rhinorrhea when out in the cold.  Denies nasal congestion.  Eyes:        Reports dry eyes for which he has seen an eye doctor.  Denies itchy eyes  Respiratory:  Negative for cough, shortness of breath and wheezing.   Cardiovascular:  Negative for chest pain and palpitations.  Gastrointestinal:        Reports occasional heartburn especially if he drinks a lot of water.  Genitourinary:  Positive for frequency.       Reports increased frequency of urination due  to enlarged prostate.  Skin:  Negative for itching and rash.  Neurological:  Negative for headaches.  Endo/Heme/Allergies:  Positive for environmental allergies.    Physical Exam: BP 140/86    Pulse 71    Temp (!) 97.2 F (36.2 C) (Temporal)    Resp 18    Ht 5\' 8"  (1.727 m)    Wt 175 lb (79.4 kg)    SpO2 97%    BMI 26.61 kg/m    Physical Exam Constitutional:      Appearance: Normal appearance.  HENT:     Head: Normocephalic and atraumatic.     Comments: Pharynx normal, eyes normal, ears: Unable to see right tympanic membrane due to cerumen.  Left ear normal.  Nose: Deviated septum noted    Right Ear: Ear canal and external ear normal.     Left  Ear: Tympanic membrane, ear canal and external ear normal.     Mouth/Throat:     Mouth: Mucous membranes are moist.     Pharynx: Oropharynx is clear.  Eyes:     Conjunctiva/sclera: Conjunctivae normal.  Cardiovascular:     Rate and Rhythm: Regular rhythm.     Heart sounds: Normal heart sounds.  Pulmonary:     Effort: Pulmonary effort is normal.     Breath sounds: Normal breath sounds.     Comments: Lungs clear to auscultation Musculoskeletal:     Cervical back: Neck supple.  Skin:    General: Skin is warm.     Comments: No rashes or urticarial lesions noted  Neurological:     Mental Status: He is alert and oriented to person, place, and time.  Psychiatric:        Mood and Affect: Mood normal.        Behavior: Behavior normal.        Thought Content: Thought content normal.        Judgment: Judgment normal.    Diagnostics: FVC 3.72 L, FEV1 2.75 L.  (93%) predicted FVC 3.91 L, predicted FEV1 2.96 L.  Spirometry indicates normal respiratory function.  Assessment and Plan: 1. Mild intermittent asthma without complication   2. Chronic idiopathic urticaria   3. Perennial allergic rhinitis   4. History of food allergy     Meds ordered this encounter  Medications   fluticasone (FLONASE) 50 MCG/ACT nasal spray    Sig: Place 2 sprays into both nostrils daily. Use 1-2 sprays in each nostril as needed    Dispense:  48 g    Refill:  1    Patient request 90 day supply.   budesonide (PULMICORT) 180 MCG/ACT inhaler    Sig: Inhale 2 puffs into the lungs 2 (two) times daily as needed (shortness of breath). Rinse, gargle, and spit after use.    Dispense:  3 each    Refill:  1    Patient needs one 90 day supply   albuterol (VENTOLIN HFA) 108 (90 Base) MCG/ACT inhaler    Sig: Inhale 2 puffs into the lungs every 6 (six) hours as needed for wheezing or shortness of breath.    Dispense:  54 g    Refill:  0    Patient request a 90 day supply.   albuterol (PROVENTIL) (2.5 MG/3ML) 0.083%  nebulizer solution    Sig: Take 3 mLs (2.5 mg total) by nebulization every 4 (four) hours as needed for wheezing or shortness of breath. Use one vial in nebulizer every four to six hours as needed for cough or wheeze.  Dispense:  250 mL    Refill:  1    Patient request a 90 day supply   azelastine (ASTELIN) 0.1 % nasal spray    Sig: Place 1 spray into both nostrils 2 (two) times daily. Use in each nostril as directed    Dispense:  30 mL    Refill:  3   EPINEPHrine 0.3 mg/0.3 mL IJ SOAJ injection    Sig: Use for life-threatening allergic reactions    Dispense:  4 each    Refill:  3    Please dispense Mylan or Teva Generic!    Patient Instructions  1. Able to eat all red meat without any problems.  Discussed with patient via telephone while increasing his Xolair to every 5 weeks he needs to be cautious with his consumption of red meat and dairy.  Instructed health Xolair could be the reason he is able to tolerate red meat and dairy.  Instructed Zebadiah to pick up his EpiPen that was prescribed today.  He verbalizes understanding.   2. Continue pro-air / albuterol neb if needed and Pulmicort 180 - 2 inhalations 2 times per day for "asthma flare"   3. Use Flonase - 1-2 sprays each nostril during periods of upper airway symptoms.   4. Continue Xolair (Omalizumab administration and have access to epinephrine auto-injector device. Lets try increasing to every 5 weeks. If you start to develop hives give out office a call.   5. Use cetrizine 10 mg - 1-2 tablets 1-2 times per day  as needed(MAX = 40 mg)  6. Start azelastine 1 spray each nostril twice a day as needed for runny nose/drainage down throat. This medication can be drying. You can use this medication as needed during December thru February when your post nasal drip is worse  7. Continue saline rinses as needed. Use this prior to any medicated nasal sprays   8. Return to clinic in 12 weeks or earlier if problem  Return in about 3  months (around 12/27/2021), or if symptoms worsen or fail to improve.    Thank you for the opportunity to care for this patient.  Please do not hesitate to contact me with questions.  Althea Charon, FNP [facility]

## 2021-09-28 NOTE — Patient Instructions (Addendum)
1. Able to eat all red meat without any problems.  Discussed with patient via telephone while increasing his Xolair to every 5 weeks he needs to be cautious with his consumption of red meat and dairy.  Instructed health Xolair could be the reason he is able to tolerate red meat and dairy.  Instructed Eric Cortez to pick up his EpiPen that was prescribed today.  He verbalizes understanding.   2. Continue pro-air / albuterol neb if needed and Pulmicort 180 - 2 inhalations 2 times per day for "asthma flare"   3. Use Flonase - 1-2 sprays each nostril during periods of upper airway symptoms.   4. Continue Xolair (Omalizumab administration and have access to epinephrine auto-injector device. Lets try increasing to every 5 weeks. If you start to develop hives give out office a call.   5. Use cetrizine 10 mg - 1-2 tablets 1-2 times per day  as needed(MAX = 40 mg)  6. Start azelastine 1 spray each nostril twice a day as needed for runny nose/drainage down throat. This medication can be drying. You can use this medication as needed during December thru February when your post nasal drip is worse  7. Continue saline rinses as needed. Use this prior to any medicated nasal sprays   8. Return to clinic in 12 weeks or earlier if problem

## 2021-10-01 ENCOUNTER — Other Ambulatory Visit: Payer: Self-pay | Admitting: *Deleted

## 2021-10-01 ENCOUNTER — Telehealth: Payer: Self-pay | Admitting: *Deleted

## 2021-10-01 MED ORDER — FLOVENT HFA 110 MCG/ACT IN AERO: 2.0000 | INHALATION_SPRAY | Freq: Two times a day (BID) | RESPIRATORY_TRACT | 5 refills | Status: DC

## 2021-10-01 NOTE — Telephone Encounter (Signed)
Received a fax from pharmacy stating that Pulmicort is not covered and preferred alternatives are Arnuity and Flovent HFA. I see that the patient has tried Arnuity, do you want to switch to Flovent?

## 2021-10-01 NOTE — Telephone Encounter (Signed)
New prescription has been sent in. Attempted to call patient but there was no dial tone. Will attempt to call tomorrow.

## 2021-10-01 NOTE — Telephone Encounter (Signed)
Let's use Flovent 110 mcg 2 puffs twice a day for 1-2 weeks with upper respiratory infection/asthma flares. Please let him know that Pulmicort inhaler is not covered.

## 2021-10-02 ENCOUNTER — Other Ambulatory Visit: Payer: Self-pay | Admitting: *Deleted

## 2021-10-02 MED ORDER — ALBUTEROL SULFATE HFA 108 (90 BASE) MCG/ACT IN AERS: 2.0000 | INHALATION_SPRAY | Freq: Four times a day (QID) | RESPIRATORY_TRACT | 1 refills | Status: DC | PRN

## 2021-10-02 MED ORDER — FLUTICASONE PROPIONATE 50 MCG/ACT NA SUSP: 2.0000 | Freq: Every day | NASAL | 1 refills | Status: DC

## 2021-10-02 NOTE — Telephone Encounter (Signed)
Called and informed patient of new prescription being sent in. Patient verbalized understanding.

## 2021-10-04 ENCOUNTER — Other Ambulatory Visit: Payer: Self-pay

## 2021-10-04 DIAGNOSIS — N400 Enlarged prostate without lower urinary tract symptoms: Secondary | ICD-10-CM

## 2021-10-04 MED ORDER — ASPIRIN EC 81 MG PO TBEC: 81.0000 mg | DELAYED_RELEASE_TABLET | Freq: Every day | ORAL | Status: DC

## 2021-10-04 MED ORDER — ALFUZOSIN HCL ER 10 MG PO TB24: 10.0000 mg | ORAL_TABLET | Freq: Every day | ORAL | 3 refills | Status: DC

## 2021-10-04 MED ORDER — ATORVASTATIN CALCIUM 20 MG PO TABS: 20.0000 mg | ORAL_TABLET | Freq: Every day | ORAL | 0 refills | Status: DC

## 2021-10-10 ENCOUNTER — Other Ambulatory Visit: Payer: Self-pay

## 2021-10-10 MED ORDER — ASPIRIN EC 81 MG PO TBEC: 81.0000 mg | DELAYED_RELEASE_TABLET | Freq: Every day | ORAL | 0 refills | Status: DC

## 2021-10-10 MED ORDER — ATORVASTATIN CALCIUM 20 MG PO TABS: 20.0000 mg | ORAL_TABLET | Freq: Every day | ORAL | 0 refills | Status: DC

## 2021-10-19 ENCOUNTER — Ambulatory Visit (INDEPENDENT_AMBULATORY_CARE_PROVIDER_SITE_OTHER): Payer: Medicare PPO

## 2021-10-19 ENCOUNTER — Ambulatory Visit (INDEPENDENT_AMBULATORY_CARE_PROVIDER_SITE_OTHER): Payer: Medicare PPO | Admitting: Podiatry

## 2021-10-19 ENCOUNTER — Other Ambulatory Visit: Payer: Self-pay

## 2021-10-19 ENCOUNTER — Encounter: Payer: Self-pay | Admitting: Podiatry

## 2021-10-19 DIAGNOSIS — M7750 Other enthesopathy of unspecified foot: Secondary | ICD-10-CM | POA: Diagnosis not present

## 2021-10-19 DIAGNOSIS — M7752 Other enthesopathy of left foot: Secondary | ICD-10-CM

## 2021-10-19 MED ORDER — TRIAMCINOLONE ACETONIDE 10 MG/ML IJ SUSP
10.0000 mg | Freq: Once | INTRAMUSCULAR | Status: AC
  Administered 2021-10-19: 10 mg

## 2021-10-20 NOTE — Progress Notes (Signed)
Subjective:   Patient ID: Eric Cortez, male   DOB: 71 y.o.   MRN: [id number]   HPI Patient presents with ankle pain left stating that she heard over 20 years ago and it can be bothersome for her and it is started to hurt more again recently after we have treated a number of years ago.  Patient does not smoke likes to be active   Review of Systems  All other systems reviewed and are negative.      Objective:  Physical Exam Vitals and nursing note reviewed.  Constitutional:      Appearance: He is well-developed.  Pulmonary:     Effort: Pulmonary effort is normal.  Musculoskeletal:        General: Normal range of motion.  Skin:    General: Skin is warm.  Neurological:     Mental Status: He is alert.    Neurovascular status intact muscle strength was found to be adequate range of motion adequate.  Patient is found to have exquisite discomfort in the left sinus tarsi with inflammation fluid buildup and reduced range of motion but no crepitus.  Good digital perfusion well oriented and     Assessment:  Inflammatory capsulitis sinus tarsi left with inflammation fluid buildup     Plan:  H&P reviewed condition and went ahead today reviewed x-rays and then did sterile prep and injected the ankle 3 mg Kenalog 5 mg Xylocaine and advised on supportive shoe gear and reduced activity.  Reappoint as needed  Multiple view x-rays were negative for signs of bony issues or arthritic issues

## 2021-10-23 ENCOUNTER — Other Ambulatory Visit: Payer: Self-pay | Admitting: Podiatry

## 2021-10-23 DIAGNOSIS — M7752 Other enthesopathy of left foot: Secondary | ICD-10-CM

## 2021-10-28 ENCOUNTER — Other Ambulatory Visit: Payer: Self-pay | Admitting: Interventional Cardiology

## 2021-11-29 ENCOUNTER — Other Ambulatory Visit (INDEPENDENT_AMBULATORY_CARE_PROVIDER_SITE_OTHER): Payer: Medicare PPO

## 2021-11-29 ENCOUNTER — Encounter: Payer: Self-pay | Admitting: Physician Assistant

## 2021-11-29 ENCOUNTER — Ambulatory Visit (INDEPENDENT_AMBULATORY_CARE_PROVIDER_SITE_OTHER): Payer: Medicare PPO | Admitting: Physician Assistant

## 2021-11-29 VITALS — BP 124/82 | HR 95 | Resp 18 | Ht 69.0 in | Wt 176.0 lb

## 2021-11-29 DIAGNOSIS — R259 Unspecified abnormal involuntary movements: Secondary | ICD-10-CM

## 2021-11-29 DIAGNOSIS — G250 Essential tremor: Secondary | ICD-10-CM | POA: Diagnosis not present

## 2021-11-29 DIAGNOSIS — G3184 Mild cognitive impairment, so stated: Secondary | ICD-10-CM

## 2021-11-29 DIAGNOSIS — R413 Other amnesia: Secondary | ICD-10-CM | POA: Diagnosis not present

## 2021-11-29 LAB — VITAMIN B12: Vitamin B-12: 203 pg/mL — ABNORMAL LOW (ref 211–911)

## 2021-11-29 NOTE — Progress Notes (Signed)
Pls inform patient that his B12 is low, needs replenishment at 1000 micrograms a day, and follow up with primary doctor

## 2021-11-29 NOTE — Patient Instructions (Addendum)
It was a pleasure to see you today at our office.  ? ?Recommendations: ? ?Neurocognitive evaluation at our office ?MRI of the brain, the radiology office will call you to arrange you appointment ?EEG  ?Check labs today ?Follow up in  1 month  ? ?RECOMMENDATIONS FOR ALL PATIENTS WITH MEMORY PROBLEMS: ?1. Continue to exercise (Recommend 30 minutes of walking everyday, or 3 hours every week) ?2. Increase social interactions - continue going to [place] and enjoy social gatherings with friends and family ?3. Eat healthy, avoid fried foods and eat more fruits and vegetables ?4. Maintain adequate blood pressure, blood sugar, and blood cholesterol level. Reducing the risk of stroke and cardiovascular disease also helps promoting better memory. ?5. Avoid stressful situations. Live a simple life and avoid aggravations. Organize your time and prepare for the next day in anticipation. ?6. Sleep well, avoid any interruptions of sleep and avoid any distractions in the bedroom that may interfere with adequate sleep quality ?7. Avoid sugar, avoid sweets as there is a strong link between excessive sugar intake, diabetes, and cognitive impairment ?We discussed the Mediterranean diet, which has been shown to help patients reduce the risk of progressive memory disorders and reduces cardiovascular risk. This includes eating fish, eat fruits and green leafy vegetables, nuts like almonds and hazelnuts, walnuts, and also use olive oil. Avoid fast foods and fried foods as much as possible. Avoid sweets and sugar as sugar use has been linked to worsening of memory function. ? ?There is always a concern of gradual progression of memory problems. If this is the case, then we may need to adjust level of care according to patient needs. Support, both to the patient and caregiver, should then be put into place.  ? ? ? ? ?You have been referred for a neuropsychological evaluation (i.e., evaluation of memory and thinking abilities). Please bring  someone with you to this appointment if possible, as it is helpful for the doctor to hear from both you and another adult who knows you well. Please bring eyeglasses and hearing aids if you wear them.  ?  ?The evaluation will take approximately 3 hours and has two parts: ?  ?The first part is a clinical interview with the neuropsychologist (Dr. Melvyn Novas or Dr. Nicole Kindred). During the interview, the neuropsychologist will speak with you and the individual you brought to the appointment.  ?  ?The second part of the evaluation is testing with the doctor's technician Hinton Dyer or Maudie Mercury). During the testing, the technician will ask you to remember different types of material, solve problems, and answer some questionnaires. Your family member will not be present for this portion of the evaluation. ?  ?Please note: We must reserve several hours of the neuropsychologist's time and the psychometrician's time for your evaluation appointment. As such, there is a No-Show fee of $100. If you are unable to attend any of your appointments, please contact our office as soon as possible to reschedule.  ? ? ?FALL PRECAUTIONS: Be cautious when walking. Scan the area for obstacles that may increase the risk of trips and falls. When getting up in the mornings, sit up at the edge of the bed for a few minutes before getting out of bed. Consider elevating the bed at the head end to avoid drop of blood pressure when getting up. Walk always in a well-lit room (use night lights in the walls). Avoid area rugs or power cords from appliances in the middle of the walkways. Use a walker or a  cane if necessary and consider physical therapy for balance exercise. Get your eyesight checked regularly. ? ?FINANCIAL OVERSIGHT: Supervision, especially oversight when making financial decisions or transactions is also recommended. ? ?HOME SAFETY: Consider the safety of the kitchen when operating appliances like stoves, microwave oven, and blender. Consider having  supervision and share cooking responsibilities until no longer able to participate in those. Accidents with firearms and other hazards in the house should be identified and addressed as well. ? ? ?ABILITY TO BE LEFT ALONE: If patient is unable to contact 911 operator, consider using LifeLine, or when the need is there, arrange for someone to stay with patients. Smoking is a fire hazard, consider supervision or cessation. Risk of wandering should be assessed by caregiver and if detected at any point, supervision and safe proof recommendations should be instituted. ? ?MEDICATION SUPERVISION: Inability to self-administer medication needs to be constantly addressed. Implement a mechanism to ensure safe administration of the medications. ? ? ?DRIVING: Regarding driving, in patients with progressive memory problems, driving will be impaired. We advise to have someone else do the driving if trouble finding directions or if minor accidents are reported. Independent driving assessment is available to determine safety of driving. ? ? ?If you are interested in the driving assessment, you can contact the following: ? ?[facility] [employer] [facility] in [place] ? ?[place] [phone] ? ?[facility] [phone] ? ?[facility] [phone] or [phone] ? ? ? ?Mediterranean Diet ?A Mediterranean diet refers to food and lifestyle choices that are based on the traditions of countries located on the [employer]. This way of eating has been shown to help prevent certain conditions and improve outcomes for people who have chronic diseases, like kidney disease and heart disease. ?What are tips for following this plan? ?Lifestyle  ?Cook and eat meals together with your family, when possible. ?Drink enough fluid to keep your urine clear or pale yellow. ?Be physically active every day. This includes: ?Aerobic exercise like running or swimming. ?Leisure activities like gardening,  walking, or housework. ?Get 7-8 hours of sleep each night. ?If recommended by your health care provider, drink red wine in moderation. This means 1 glass a day for nonpregnant women and 2 glasses a day for men. A glass of wine equals 5 oz (150 mL). ?Reading food labels  ?Check the serving size of packaged foods. For foods such as rice and pasta, the serving size refers to the amount of cooked product, not dry. ?Check the total fat in packaged foods. Avoid foods that have saturated fat or trans fats. ?Check the ingredients list for added sugars, such as corn syrup. ?Shopping  ?At the grocery store, buy most of your food from the areas near the walls of the store. This includes: ?Fresh fruits and vegetables (produce). ?Grains, beans, nuts, and seeds. Some of these may be available in unpackaged forms or large amounts (in bulk). ?Fresh seafood. ?Poultry and eggs. ?Low-fat dairy products. ?Buy whole ingredients instead of prepackaged foods. ?Buy fresh fruits and vegetables in-season from local farmers markets. ?Buy frozen fruits and vegetables in resealable bags. ?If you do not have access to quality fresh seafood, buy precooked frozen shrimp or canned fish, such as tuna, salmon, or sardines. ?Buy small amounts of raw or cooked vegetables, salads, or olives from the deli or salad bar at your store. ?Stock your pantry so you always have certain foods on hand, such as olive oil, canned tuna, canned tomatoes, rice, pasta, and beans. ?Cooking  ?  Cook foods with extra-virgin olive oil instead of using butter or other vegetable oils. ?Have meat as a side dish, and have vegetables or grains as your main dish. This means having meat in small portions or adding small amounts of meat to foods like pasta or stew. ?Use beans or vegetables instead of meat in common dishes like chili or lasagna. ?Experiment with different cooking methods. Try roasting or broiling vegetables instead of steaming or saut?eing them. ?Add frozen vegetables  to soups, stews, pasta, or rice. ?Add nuts or seeds for added healthy fat at each meal. You can add these to yogurt, salads, or vegetable dishes. ?Marinate fish or vegetables using olive oil, lemon juice, garlic,

## 2021-11-29 NOTE — Progress Notes (Signed)
? ? ?Assessment/Plan:  ? ?Eric Cortez is a very pleasant 71 y.o. 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 MoCA today is 22/30, with deficiencies in delayed recall 1/5, abstraction, language, memory, and visual-spatial executive, as well as fluency.  Majority of labs were performed at the [place], and appeared to be normal. ? ?Mild cognitive impairment, likely due to Alzheimer's disease ? ?MRI brain with/without contrast to assess for underlying structural abnormality and assess vascular load  ?Neurocognitive testing to further evaluate cognitive concerns and determine other underlying cause of memory changes, including potential contribution from sleep, anxiety, or depression  ?Check B12 ?EEG  ?Discussed safety both in and out of the home.  ?Discussed the importance of regular daily schedule with inclusion of crossword puzzles to maintain brain function.  ?Continue to monitor mood with PCP.  ?Stay active at least 30 minutes at least 3 times a week.  ?Naps should be scheduled and should be no longer than 60 minutes and should not occur after 2 PM.  ?Control cardiovascular risk factors  ?Mediterranean diet is recommended  ?Folllow up in 1 month ? ?Essential tremor ?This is present while doing a task.  He reports his right hand having more tremor, but no known has been shown today on exam.   ?No indication for medication at this time. ? ?Subjective:  ? ? ?The patient is seen in neurologic consultation at the request of Newman Pies, MD for the evaluation of memory.  The patient is accompanied by  who supplements the history. ?This is a 71 y.o. year old RH  male who has had memory issues for about 2 years, although he says that he is memory "always has not trouble, all my life, I am hard of hearing is a test I cannot understand ".  Currently, he is  not using his hearing aids.  His wife states that he does repeat himself, and forgets recent conversations.  He never was diagnosed with ADD or ADHD, although his wife does suspect that he has it.  He denies being disoriented when walking into her room or moving objects in unusual places, however he "loses things constantly, and is always trying to find staff ".  He ambulates without difficulty, recently, he has been under the care of his neurosurgeon, for different areas of chronic pain, and spondylolisthesis, and he denies any worsening pain at this time, he is able to walk well, without any falls.  He does not need a cane or a walker to ambulate.  He denies any broad-based gait.  At age 41, he had a head injury when falling from a tree, which left the right parietal frontal area scar.  He also has being hit by a baseball when he was younger "cry on top of my head and he left the soft spot ".  He denies having had meningitis or encephalitis as a child, with a difficult birth.  He drives, and his wife states that sometimes he is still taking the shortcut, he takes the loan card, "he does not listen to me ".  This frustrates her.  He lives with her, and his mood is overall stable, denies any depression or irritability.  His sleep varies, he has occasional vivid dreams.  His wife states that he does not do sleepwalking, or REM behavior, however he has had moments in which  she thought that he was having a seizure, he had jerky movements and possible restless leg syndrome.  His doctor placed him on a muscle relaxer, which has helped him.  He denies any history of seizures.  He denies any hallucinations or paranoia, there are no hygiene concerns, he is independent of bathing and dressing.  His medications are in a pillbox, he denies missing any doses.  He is in charge of his finances.  His appetite is good, denies trouble swallowing, he does have mild increase in production of saliva.  He does not cook.  He denies any  headaches, double vision, dizziness, focal numbness or tingling, unilateral weakness, or anosmia.  He has a history of mild intention tremors for the last 7 or 8 months.  He says that at times if he is left hand is weak, he gets muscle jerks, he drops objects.  He denies micrographia, he denies any voice changes.  He also reports muscle cramps over the last 6 months, "off and on and worse over the last month ".  He still able to do his activities of daily living. He denies any urine incontinence, retention, constipation or diarrhea.  Denies any history of sleep apnea, alcohol or tobacco.  He denies any history of melanoma.  Family history negative for dementia or Parkinson's disease. ?No Known Allergies ? ?Current Outpatient Medications  ?Medication Instructions  ? albuterol (PROVENTIL) 2.5 mg, Nebulization, Every 4 hours PRN, Use one vial in nebulizer every four to six hours as needed for cough or wheeze.  ? albuterol (VENTOLIN HFA) 108 (90 Base) MCG/ACT inhaler 2 puffs, Inhalation, Every 6 hours PRN  ? alfuzosin (UROXATRAL) 10 mg, Oral, Daily  ? aspirin EC 81 mg, Oral, Daily, Please make yearly appt with Dr. Tamala Julian for April 2023 for future refills. Thank you 1st attempt  ? atorvastatin (LIPITOR) 20 mg, Oral, Daily, Please make yearly appt with Dr. Tamala Julian for April 2023 for future refills. Thank you 1st attempt  ? azelastine (ASTELIN) 0.1 % nasal spray 1 spray, Each Nare, 2 times daily, Use in each nostril as directed  ? budesonide (PULMICORT) 180 MCG/ACT inhaler 2 puffs, Inhalation, 2 times daily PRN, Rinse, gargle, and spit after use.  ? buPROPion (WELLBUTRIN XL) 150 MG 24 hr tablet 1 tablet, Oral, Daily  ? buPROPion (WELLBUTRIN XL) 150 mg, Oral, Daily  ? CoQ-10 150 mg, Oral, Daily  ? EPINEPHrine 0.3 mg/0.3 mL IJ SOAJ injection Use for life-threatening allergic reactions  ? FLOVENT HFA 110 MCG/ACT inhaler 2 puffs, Inhalation, 2 times daily  ? fluticasone (FLONASE) 50 MCG/ACT nasal spray 2 sprays, Each Nare, Daily,  Use 1-2 sprays in each nostril as needed  ? melatonin 5 mg, Oral, At bedtime PRN  ? nitroGLYCERIN (NITROSTAT) 0.4 mg, Sublingual, Every 5 min x3 PRN  ? omalizumab (XOLAIR) 300 mg, Subcutaneous, Every 28 days  ? ? ? ?VITALS:   ?Vitals:  ? 11/29/21 1335  ?BP: 124/82  ?Pulse: 95  ?Resp: 18  ?SpO2: 95%  ?Weight: 176 lb (79.8 kg)  ?Height: '5\' 9"'$  (1.753 m)  ? ? ?  07/18/2014  ? 10:55 AM 04/05/2014  ? 11:01 AM  ?Depression screen PHQ 2/9  ?Decreased Interest 0 0  ?Down, Depressed, Hopeless 0 0  ?PHQ - 2 Score 0 0  ? ? ?PHYSICAL EXAM  ? ?HEENT:  Normocephalic, atraumatic. The mucous membranes are moist. The superficial temporal arteries are without ropiness or tenderness. ?Cardiovascular: Regular rate and rhythm. ?Lungs: Clear to auscultation bilaterally. ?Neck:  There are no carotid bruits noted bilaterally. ? ?NEUROLOGICAL: ? ?  11/29/2021  ?  3:00 PM  ?Montreal Cognitive Assessment   ?Visuospatial/ Executive (0/5) 4  ?Naming (0/3) 3  ?Attention: Read list of digits (0/2) 2  ?Attention: Read list of letters (0/1) 1  ?Attention: Serial 7 subtraction starting at 100 (0/3) 3  ?Language: Repeat phrase (0/2) 1  ?Language : Fluency (0/1) 0  ?Abstraction (0/2) 1  ?Delayed Recall (0/5) 1  ?Orientation (0/6) 6  ?Total 22  ?Adjusted Score (based on education) 22  ?  ?   ? View : No data to display.  ?  ?  ?  ?  ? ?Orientation:  Alert and oriented to person, place and time. No aphasia or dysarthria. Fund of knowledge is appropriate. Recent memory impaired and remote memory intact.  Attention and concentration are normal.  Able to name objects and repeat phrases. Delayed recall 1/5 ?Cranial nerves: There is good facial symmetry. Extraocular muscles are intact and visual fields are full to confrontational testing. Speech is fluent and clear. Soft palate rises symmetrically and there is no tongue deviation. Hearing is intact to conversational tone. ?Tone: Tone is good throughout.  No cogwheeling noted. ?Sensation: Sensation is intact to  [place] touch and pinprick throughout. Vibration is intact at the bilateral big toe.There is no extinction with double simultaneous stimulation. There is no sensory dermatomal level identified. ?Coordinatio

## 2021-12-03 ENCOUNTER — Ambulatory Visit (INDEPENDENT_AMBULATORY_CARE_PROVIDER_SITE_OTHER): Payer: Medicare PPO | Admitting: Neurology

## 2021-12-03 DIAGNOSIS — R413 Other amnesia: Secondary | ICD-10-CM | POA: Diagnosis not present

## 2021-12-03 NOTE — Progress Notes (Signed)
Pls inform patient EEG is normal. Thanks

## 2021-12-03 NOTE — Procedures (Signed)
ELECTROENCEPHALOGRAM REPORT ? ?Date of Study: 12/03/2021 ? ?Patient's Name: Eric Cortez ?MRN: [id number] ?Date of Birth: 06-01-1951 ? ?Referring Provider: Sharene Butters, PA-C ? ?Clinical History: This is a 71 year old man with memory loss and body jerks. EEG for classification. ? ?Medications: ?Xolair ?Bupropion ?Lipitor ?Uroxatral ? ?Technical Summary: ?A multichannel digital EEG recording measured by the international 10-20 system with electrodes applied with paste and impedances below 5000 ohms performed in our laboratory with EKG monitoring in an awake and asleep patient.  Hyperventilation was not performed. Photic stimulation was performed.  The digital EEG was referentially recorded, reformatted, and digitally filtered in a variety of bipolar and referential montages for optimal display.   ? ?Description: ?The patient is awake and asleep during the recording.  During maximal wakefulness, there is a symmetric, medium voltage 9 Hz posterior dominant rhythm that attenuates with eye opening.  The record is symmetric.  During drowsiness and sleep, there is an increase in theta slowing of the background.  Vertex waves and symmetric sleep spindles were seen.  Photic stimulation did not elicit any abnormalities.  There were no epileptiform discharges or electrographic seizures seen.   ? ?EKG lead was unremarkable. ? ?Impression: ?This awake and asleep EEG is normal.   ? ?Clinical Correlation: ?A normal EEG does not exclude a clinical diagnosis of epilepsy.  If further clinical questions remain, prolonged EEG may be helpful.  Clinical correlation is advised. ? ? ?Ellouise Newer, M.D. ? ?

## 2021-12-20 ENCOUNTER — Ambulatory Visit
Admission: RE | Admit: 2021-12-20 | Discharge: 2021-12-20 | Disposition: A | Discharge status: Home / Self Care | Payer: Medicare PPO | Source: Ambulatory Visit | Attending: Physician Assistant | Admitting: Physician Assistant

## 2021-12-20 NOTE — Progress Notes (Signed)
Please, inform patient that the MRI results show moderate to advanced  chronic aging changes in the vessels, but no atrophy in the brain. Need to pay extra care to all the cardiovascular risk factors. .No stroke, masses, fluid or infection is seen. Thank you   ? ??

## 2021-12-24 ENCOUNTER — Telehealth: Payer: Self-pay | Admitting: Physician Assistant

## 2021-12-24 NOTE — Telephone Encounter (Signed)
Patient called Eric Cortez back about results ?

## 2021-12-24 NOTE — Telephone Encounter (Signed)
Notified of results

## 2021-12-24 NOTE — Telephone Encounter (Signed)
Voicemail again. ?

## 2021-12-27 NOTE — Patient Instructions (Incomplete)
1. Able to eat all red meat without any problems.  Discussed with patient  while increasing his Xolair to every 5 weeks he needs to be cautious with his consumption of red meat and dairy.  Instructed Eric Cortez that Xolair could be the reason he is able to tolerate red meat and dairy.  Instructed Eric Cortez to keep his EpiPen on hand   ?  ?2. Continue albuterol / albuterol neb if needed and Flovent 110 mcg - 2 inhalations 2 times per day for "asthma flare" ?  ?3. Use Flonase - 1-2 sprays each nostril during periods of upper airway symptoms. ?  ?4. Continue Xolair (Omalizumab) injections every 5 weeks and have access to epinephrine auto-injector device.  ?  ?5. Use cetrizine 10 mg - 1-2 tablets 1-2 times per day  as needed(MAX = 40 mg) ? ?6. Continue azelastine 1 spray each nostril twice a day as needed for runny nose/drainage down throat. This medication can be drying. You can use this medication as needed during December thru February when your post nasal drip is worse ? ?7. Continue saline rinses as needed. Use this prior to any medicated nasal sprays ?  ?8. Return to clinic in months or earlier if problem ?

## 2021-12-28 ENCOUNTER — Ambulatory Visit: Payer: Medicare PPO | Admitting: Family

## 2021-12-31 ENCOUNTER — Encounter: Payer: Self-pay | Admitting: Physician Assistant

## 2021-12-31 ENCOUNTER — Encounter: Payer: Self-pay | Admitting: Family Medicine

## 2021-12-31 ENCOUNTER — Ambulatory Visit (INDEPENDENT_AMBULATORY_CARE_PROVIDER_SITE_OTHER): Payer: Medicare PPO | Admitting: Family Medicine

## 2021-12-31 ENCOUNTER — Ambulatory Visit (INDEPENDENT_AMBULATORY_CARE_PROVIDER_SITE_OTHER): Payer: Medicare PPO | Admitting: Physician Assistant

## 2021-12-31 VITALS — BP 144/83 | HR 67 | Ht 69.0 in | Wt 176.0 lb

## 2021-12-31 VITALS — BP 130/82 | HR 73 | Temp 98.4°F | Resp 16 | Ht 68.0 in | Wt 174.2 lb

## 2021-12-31 DIAGNOSIS — G3184 Mild cognitive impairment, so stated: Secondary | ICD-10-CM

## 2021-12-31 DIAGNOSIS — J4520 Mild intermittent asthma, uncomplicated: Secondary | ICD-10-CM | POA: Diagnosis not present

## 2021-12-31 DIAGNOSIS — J3089 Other allergic rhinitis: Secondary | ICD-10-CM

## 2021-12-31 DIAGNOSIS — L501 Idiopathic urticaria: Secondary | ICD-10-CM | POA: Diagnosis not present

## 2021-12-31 DIAGNOSIS — T781XXA Other adverse food reactions, not elsewhere classified, initial encounter: Secondary | ICD-10-CM

## 2021-12-31 HISTORY — DX: Other allergic rhinitis: J30.89

## 2021-12-31 HISTORY — DX: Other adverse food reactions, not elsewhere classified, initial encounter: T78.1XXA

## 2021-12-31 HISTORY — DX: Idiopathic urticaria: L50.1

## 2021-12-31 MED ORDER — MEMANTINE HCL 5 MG PO TABS: 5.0000 mg | ORAL_TABLET | Freq: Every day | ORAL | 3 refills | Status: DC

## 2021-12-31 NOTE — Progress Notes (Addendum)
? ?[address], [place] [address] ?Dept: [phone] ? ?FOLLOW UP NOTE ? ?Patient ID: [name], male    DOB: 1951-02-09  Age: 71 y.o. MRN: [id number] ?Date of Office Visit: 12/31/2021 ? ?Assessment  ?Chief Complaint: Asthma (Says he is doing well. ), Immunotherapy (Xolair is administered at home and currently being spaced out to be completed soon. ), and Allergic Rhinitis  (Says they are good. No issues. ) ? ?HPI ?EVANN ERAZO is a 71 year old male who presents to the clinic for follow-up visit.  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  He is accompanied by his wife who contributes some of the history.  At today's visit, he reports his asthma has been well controlled with no shortness of breath, cough, or wheeze with activity or rest.  He has not used his albuterol or Pulmicort since his last visit to this clinic.  Chronic rhinitis is reported as moderately well controlled with symptoms including clear rhinorrhea when the weather is cold, occasional nasal congestion, and seasonal postnasal drainage.  He continues Flonase daily, cetirizine as needed, and is not currently using azelastine or nasal saline rinses.  He reports that he has not had hives since his last visit to this clinic.  He continues Xolair injections at home once every 6 weeks with no large or local reactions.  He continues to consume red meat and dairy on a regular basis and is not avoiding any foods at this time.  EpiPen's are up-to-date.  They report that they live 90 minutes from a medical facility and are requesting prednisone to be given after epinephrine and 911 have been called in case of an anaphylactic reaction.  His current medications are listed in the chart. ? ? ?Drug Allergies:  ?No Known Allergies ? ?Physical Exam: ?BP 130/82   Pulse 73   Temp 98.4 ?F (36.9 ?C) (Temporal)   Resp 16    Ht '5\' 8"'$  (1.727 m)   Wt 174 lb 3.2 oz (79 kg)   SpO2 95%   BMI 26.49 kg/m?   ? ?Physical Exam ?Vitals reviewed.  ?Constitutional:   ?   Appearance: Normal appearance.  ?HENT:  ?   Head: Normocephalic and atraumatic.  ?   Right Ear: Tympanic membrane normal.  ?   Left Ear: Tympanic membrane normal.  ?   Nose:  ?   Comments: Bilateral naris slightly erythematous with clear nasal drainage noted.  Pharynx normal.  Ears normal.  Eyes normal. ?   Mouth/Throat:  ?   Pharynx: Oropharynx is clear.  ?Eyes:  ?   Conjunctiva/sclera: Conjunctivae normal.  ?Cardiovascular:  ?   Rate and Rhythm: Normal rate and regular rhythm.  ?   Heart sounds: Normal heart sounds. No murmur heard. ?Pulmonary:  ?   Effort: Pulmonary effort is normal.  ?   Breath sounds: Normal breath sounds.  ?   Comments: Lungs clear to auscultation ?Musculoskeletal:     ?   General: Normal range of motion.  ?   Cervical back: Normal range of motion and neck supple.  ?Skin: ?   General: Skin is warm and dry.  ?Neurological:  ?   Mental Status: He is alert and oriented to person, place, and time.  ?Psychiatric:     ?   Mood and Affect: Mood normal.     ?   Behavior:  Behavior normal.     ?   Thought Content: Thought content normal.     ?   Judgment: Judgment normal.  ? ?Diagnostics:  ?FVC 3.85, FEV1 2.96.  Predicted FVC 3.90, predicted FEV1 2.95.  Spirometry indicates normal ventilatory function. ? ?Assessment and Plan: ?1. Mild intermittent asthma without complication   ?2. Allergic reaction to alpha-gal   ?3. Chronic idiopathic urticaria   ?4. Perennial allergic rhinitis   ? ? ?Patient Instructions  ?Asthma ?Continue albuterol 2 puffs once every 4 hours as needed for cough or wheeze ?You may use albuterol 2 puffs 5 to 15 minutes before activity to decrease cough or wheeze ?For asthma flare, begin Flovent 110-2 puffs twice a day for 2 weeks or until cough and wheeze free ? ?Allergic rhinitis ?Continue cetirizine 10 mg once a day as needed for runny  nose ?Continue Flonase 1 to 2 sprays in each nostril once a day as needed for a stuffy nose.  In the right nostril, point the applicator out toward the right ear. In the left nostril, point the applicator out toward the left ear ?Consider saline nasal rinses as needed for nasal symptoms. Use this before any medicated nasal sprays for best result ?Continue azelastine 2 sprays in each nostril up to twice a day.  He may benefit from this medication during the months of December through February ? ?Chronic urticaria ?Continue Xolair injections once every 6 weeks and have access to an epinephrine autoinjector set ? ?Alpha gal allergy ?Consider avoiding red meat and dairy. We have placed a lab order to help [place] evaluate your alpha gal allergy. We will call you when the results become available.  Variability in reliable blood testing results for alpha gal panel discussed with patient as 6 weeks is likely not enough time for Xolair to washout. ? In case of an allergic reaction, give Benadryl 50 mg capsules every 4 hours, and if life-threatening symptoms occur, inject with EpiPen 0.3 mg.  ? ?Call the clinic if this treatment plan is not working well for you. ? ?Follow up in 6 months or sooner if needed. ? ? ?Return in about 6 months (around 07/03/2022), or if symptoms worsen or fail to improve. ?  ? ?Thank you for the opportunity to care for this patient.  Please do not hesitate to contact me with questions. ? ?Gareth Morgan, FNP ?[facility] [place] ? ? ? ? ? ?

## 2021-12-31 NOTE — Patient Instructions (Addendum)
It was a pleasure to see you today at our office.  ? ?Recommendations: ? ?Keep the neurocognitive testing  ?Start Memantine '5mg'$  tablets.  Take 1 tablet at bedtime twice daily. Call if severe symptoms appear  ?Follow up in  1 month  July 12 11:30  ? ?RECOMMENDATIONS FOR ALL PATIENTS WITH MEMORY PROBLEMS: ?1. Continue to exercise (Recommend 30 minutes of walking everyday, or 3 hours every week) ?2. Increase social interactions - continue going to [place] and enjoy social gatherings with friends and family ?3. Eat healthy, avoid fried foods and eat more fruits and vegetables ?4. Maintain adequate blood pressure, blood sugar, and blood cholesterol level. Reducing the risk of stroke and cardiovascular disease also helps promoting better memory. ?5. Avoid stressful situations. Live a simple life and avoid aggravations. Organize your time and prepare for the next day in anticipation. ?6. Sleep well, avoid any interruptions of sleep and avoid any distractions in the bedroom that may interfere with adequate sleep quality ?7. Avoid sugar, avoid sweets as there is a strong link between excessive sugar intake, diabetes, and cognitive impairment ?We discussed the Mediterranean diet, which has been shown to help patients reduce the risk of progressive memory disorders and reduces cardiovascular risk. This includes eating fish, eat fruits and green leafy vegetables, nuts like almonds and hazelnuts, walnuts, and also use olive oil. Avoid fast foods and fried foods as much as possible. Avoid sweets and sugar as sugar use has been linked to worsening of memory function. ? ?There is always a concern of gradual progression of memory problems. If this is the case, then we may need to adjust level of care according to patient needs. Support, both to the patient and caregiver, should then be put into place.  ? ? ? ? ?You have been referred for a neuropsychological evaluation (i.e., evaluation of memory and thinking abilities). Please bring  someone with you to this appointment if possible, as it is helpful for the doctor to hear from both you and another adult who knows you well. Please bring eyeglasses and hearing aids if you wear them.  ?  ?The evaluation will take approximately 3 hours and has two parts: ?  ?The first part is a clinical interview with the neuropsychologist (Dr. Melvyn Novas or Dr. Nicole Kindred). During the interview, the neuropsychologist will speak with you and the individual you brought to the appointment.  ?  ?The second part of the evaluation is testing with the doctor's technician Hinton Dyer or Maudie Mercury). During the testing, the technician will ask you to remember different types of material, solve problems, and answer some questionnaires. Your family member will not be present for this portion of the evaluation. ?  ?Please note: We must reserve several hours of the neuropsychologist's time and the psychometrician's time for your evaluation appointment. As such, there is a No-Show fee of $100. If you are unable to attend any of your appointments, please contact our office as soon as possible to reschedule.  ? ? ?FALL PRECAUTIONS: Be cautious when walking. Scan the area for obstacles that may increase the risk of trips and falls. When getting up in the mornings, sit up at the edge of the bed for a few minutes before getting out of bed. Consider elevating the bed at the head end to avoid drop of blood pressure when getting up. Walk always in a well-lit room (use night lights in the walls). Avoid area rugs or power cords from appliances in the middle of the walkways. Use a  walker or a cane if necessary and consider physical therapy for balance exercise. Get your eyesight checked regularly. ? ?FINANCIAL OVERSIGHT: Supervision, especially oversight when making financial decisions or transactions is also recommended. ? ?HOME SAFETY: Consider the safety of the kitchen when operating appliances like stoves, microwave oven, and blender. Consider having  supervision and share cooking responsibilities until no longer able to participate in those. Accidents with firearms and other hazards in the house should be identified and addressed as well. ? ? ?ABILITY TO BE LEFT ALONE: If patient is unable to contact 911 operator, consider using LifeLine, or when the need is there, arrange for someone to stay with patients. Smoking is a fire hazard, consider supervision or cessation. Risk of wandering should be assessed by caregiver and if detected at any point, supervision and safe proof recommendations should be instituted. ? ?MEDICATION SUPERVISION: Inability to self-administer medication needs to be constantly addressed. Implement a mechanism to ensure safe administration of the medications. ? ? ?DRIVING: Regarding driving, in patients with progressive memory problems, driving will be impaired. We advise to have someone else do the driving if trouble finding directions or if minor accidents are reported. Independent driving assessment is available to determine safety of driving. ? ? ?If you are interested in the driving assessment, you can contact the following: ? ?[employer] in [place] ? ?[place] [phone] ? ?[facility] [phone] ? ?[facility] [phone] or [phone] ? ? ? ?Mediterranean Diet ?A Mediterranean diet refers to food and lifestyle choices that are based on the traditions of countries located on the [employer]. This way of eating has been shown to help prevent certain conditions and improve outcomes for people who have chronic diseases, like kidney disease and heart disease. ?What are tips for following this plan? ?Lifestyle  ?Cook and eat meals together with your family, when possible. ?Drink enough fluid to keep your urine clear or pale yellow. ?Be physically active every day. This includes: ?Aerobic exercise like running or swimming. ?Leisure activities like gardening,  walking, or housework. ?Get 7-8 hours of sleep each night. ?If recommended by your health care provider, drink red wine in moderation. This means 1 glass a day for nonpregnant women and 2 glasses a day for men. A glass of wine equals 5 oz (150 mL). ?Reading food labels  ?Check the serving size of packaged foods. For foods such as rice and pasta, the serving size refers to the amount of cooked product, not dry. ?Check the total fat in packaged foods. Avoid foods that have saturated fat or trans fats. ?Check the ingredients list for added sugars, such as corn syrup. ?Shopping  ?At the grocery store, buy most of your food from the areas near the walls of the store. This includes: ?Fresh fruits and vegetables (produce). ?Grains, beans, nuts, and seeds. Some of these may be available in unpackaged forms or large amounts (in bulk). ?Fresh seafood. ?Poultry and eggs. ?Low-fat dairy products. ?Buy whole ingredients instead of prepackaged foods. ?Buy fresh fruits and vegetables in-season from local farmers markets. ?Buy frozen fruits and vegetables in resealable bags. ?If you do not have access to quality fresh seafood, buy precooked frozen shrimp or canned fish, such as tuna, salmon, or sardines. ?Buy small amounts of raw or cooked vegetables, salads, or olives from the deli or salad bar at your store. ?Stock your pantry so you always have certain foods on hand, such as olive oil, canned tuna, canned tomatoes, rice, pasta, and  beans. ?Cooking  ?Cook foods with extra-virgin olive oil instead of using butter or other vegetable oils. ?Have meat as a side dish, and have vegetables or grains as your main dish. This means having meat in small portions or adding small amounts of meat to foods like pasta or stew. ?Use beans or vegetables instead of meat in common dishes like chili or lasagna. ?Experiment with different cooking methods. Try roasting or broiling vegetables instead of steaming or saut?eing them. ?Add frozen vegetables  to soups, stews, pasta, or rice. ?Add nuts or seeds for added healthy fat at each meal. You can add these to yogurt, salads, or vegetable dishes. ?Marinate fish or vegetables using olive oil, lemon juice, gar

## 2021-12-31 NOTE — Patient Instructions (Addendum)
Asthma ?Continue albuterol 2 puffs once every 4 hours as needed for cough or wheeze ?You may use albuterol 2 puffs 5 to 15 minutes before activity to decrease cough or wheeze ?For asthma flare, begin Flovent 110-2 puffs twice a day for 2 weeks or until cough and wheeze free ? ?Allergic rhinitis ?Continue cetirizine 10 mg once a day as needed for runny nose ?Continue Flonase 1 to 2 sprays in each nostril once a day as needed for a stuffy nose.  In the right nostril, point the applicator out toward the right ear. In the left nostril, point the applicator out toward the left ear ?Consider saline nasal rinses as needed for nasal symptoms. Use this before any medicated nasal sprays for best result ?Continue azelastine 2 sprays in each nostril up to twice a day.  He may benefit from this medication during the months of December through February ? ?Chronic urticaria ?Continue Xolair injections once every 6 weeks and have access to an epinephrine autoinjector set ? ?Alpha gal allergy ?Consider avoiding red meat and dairy. We have placed a lab order to help [place] evaluate your alpha gal allergy. We will call you when the results become available.  Variability in reliable blood testing results for alpha gal panel discussed with patient as 6 weeks is likely not enough time for Xolair to washout. ? In case of an allergic reaction, give Benadryl 50 mg capsules every 4 hours, and if life-threatening symptoms occur, inject with EpiPen 0.3 mg.  ? ?Call the clinic if this treatment plan is not working well for you. ? ?Follow up in 6 months or sooner if needed. ? ?

## 2021-12-31 NOTE — Progress Notes (Signed)
? ?Assessment/Plan:  ? 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 ?71 year old man with history of mild cognitive impairment, last MoCA 22/30.  MRI of the brain is remarkable for moderate to advanced chronic small vessel ischemic changes in the cerebral white matter, pons and right middle cerebellar peduncle .  There is no age advanced or lobar predominant parenchymal atrophy.  Patient is not on antidementia medication. ? ? ? Recommendations:  ? ?Discussed safety both in and out of the home.  ?Discussed the importance of regular daily schedule with inclusion of crossword puzzles to maintain brain function.  ?Continue to monitor mood by PCP ?Stay active at least 30 minutes at least 3 times a week.  ?Naps should be scheduled and should be no longer than 60 minutes and should not occur after 2 PM.  ?Mediterranean diet is recommended  ?Control cardiovascular risk factors  ? Start Memantine 5 milligrams nightly side effects were discussed ?Follow up in 3 months. ? ? ?Case discussed with Dr. Delice Lesch who agrees with the plan ? ? ?Subjective:  ? ? ?Eric Cortez is a very pleasant 71 y.o. RH male  seen today in follow up for memory loss. This patient is accompanied in the office by his wife who supplements the history.  Previous records as well as any outside records available were reviewed prior to todays visit.  Patient was last seen at our office on 11/29/2021 at which time his MoCA was 22/30.  He is not on antidementia medications. ? ? ? ?Any changes in memory since last visit? Wife notices redo the sentences across because he does not seem to understand .  He attributes these to the difference in education between his wife who has a graduate degree, and him who barely finished high school (as it stated by him). ?Patient lives with: Wife ?repeats oneself?  Denies ?Disoriented when walking into a room?  Patient denies   ?Leaving objects in unusual places?  Patient denies   ?Ambulates  with difficulty?    Patient denies   ?Recent falls?  Patient denies   ?Any head injuries?  Patient denies   ?History of seizures?   Patient denies   ?Wandering behavior?  Patient denies   ?Patient drives?    He drives, denies getting lost . Patient uses [employer] to drive "he scares the heck out of me sometimes, pulled in front of a vehicle, didn't realize he was on the wrong lane"    ?Any mood changes such irritability agitation?  Patient denies .  His wife states that due to recent construction in the house, some stress has been added.   ?Any history of depression?:  Patient denies   ?Hallucinations?  Patient denies   ?Paranoia?  Patient denies   ?Patient reports that he sleeps well without vivid dreams, REM behavior or sleepwalking. May need to wake up once to urinate ?History of sleep apnea?  Patient denies   ?Any hygiene concerns?  Patient denies   ?Independent of bathing and dressing?  Endorsed  ?Does the patient needs help with medications? No issues  ?Who is in charge of the finances?  Patient is in charge   ?Any changes in appetite?  Patient denies   ?Patient have trouble swallowing? Patient denies   ?Does the patient cook?  Patient denies   ?Any kitchen accidents such as leaving the stove on? Patient denies   ?Any headaches?  Patient denies   ?The double vision? Patient denies   ?Any focal numbness or  tingling?  Patient denies   ?Chronic back pain Patient denies   ?Unilateral weakness?  Patient denies   ?Any tremors?  Patient denies   ?Any history of anosmia?  Patient denies   ?Any incontinence of urine? BPH, frequency  ?Any bowel dysfunction?  No issues  ? ? ?Initial Visit 11/29/21 The patient is seen in neurologic consultation at the request of Newman Pies, MD for the evaluation of memory.  The patient is accompanied by  who supplements the history. ?This is a 71 y.o. year old RH  male who has had memory issues for about 2 years, although he says that he is memory "always has not trouble, all my life, I am hard of hearing is  a test I cannot understand ".  Currently, he is not using his hearing aids.  His wife states that he does repeat himself, and forgets recent conversations.  He never was diagnosed with ADD or ADHD, although his wife does suspect that he has it.  He denies being disoriented when walking into her room or moving objects in unusual places, however he "loses things constantly, and is always trying to find staff ".  He ambulates without difficulty, recently, he has been under the care of his neurosurgeon, for different areas of chronic pain, and spondylolisthesis, and he denies any worsening pain at this time, he is able to walk well, without any falls.  He does not need a cane or a walker to ambulate.  He denies any broad-based gait.  At age 70, he had a head injury when falling from a tree, which left the right parietal frontal area scar.  He also has being hit by a baseball when he was younger "cry on top of my head and he left the soft spot ".  He denies having had meningitis or encephalitis as a child, with a difficult birth.  He drives, and his wife states that sometimes he is still taking the shortcut, he takes the loan card, "he does not listen to me ".  This frustrates her.  He lives with her, and his mood is overall stable, denies any depression or irritability.  His sleep varies, he has occasional vivid dreams.  His wife states that he does not do sleepwalking, or REM behavior, however he has had moments in which she thought that he was having a seizure, he had jerky movements and possible restless leg syndrome.  His doctor placed him on a muscle relaxer, which has helped him.  He denies any history of seizures.  He denies any hallucinations or paranoia, there are no hygiene concerns, he is independent of bathing and dressing.  His medications are in a pillbox, he denies missing any doses.  He is in charge of his finances.  His appetite is good, denies trouble swallowing, he does have mild increase in  production of saliva.  He does not cook.  He denies any headaches, double vision, dizziness, focal numbness or tingling, unilateral weakness, or anosmia.  He has a history of mild intention tremors for the last 7 or 8 months.  He says that at times if he is left hand is weak, he gets muscle jerks, he drops objects.  He denies micrographia, he denies any voice changes.  He also reports muscle cramps over the last 6 months, "off and on and worse over the last month ".  He still able to do his activities of daily living. He denies any urine incontinence, retention, constipation or diarrhea.  Denies any history of sleep apnea, alcohol or tobacco.  He denies any history of melanoma.  Family history negative for dementia or Parkinson's disease. ? ?MRI brain 12/20/21 No evidence of acute intracranial abnormality.Moderate-to-advanced chronic small vessel ischemic changes within the cerebral white matter. Mild chronic small vessel ischemic changes are also present within the pons and right middle cerebellar ?peduncle.No age advanced or lobar predominant parenchymal atrophy. ? ?EEG 12/2021 was normal  ?No Known Allergies ? ? ? ? ?Past Medical History:  ?Diagnosis Date  ? Asthma   ? Atypical mole 2014  ? moderate left chest  ? CAD (coronary artery disease)   ? a. 01/2014 Inf STEMI/PCI: LM nl, LAD 50p/m, LCX 30-29m OM1 50, OM2/3/4 nl, RCA (anomalous) 100p (3.5x23 Xience Alpine DES), EF 60;   ? Food allergy   ? 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 HTN (hypertension)   ? Ischemic cardiomyopathy   ? ab. 01/2014 Echo: EF 45-50%, inferoseptal/inf sev HK, Gr 1 DD, nl valves.  ? Myocardial infarction ([facility]   ? Urticaria   ?  ? ?Past Surgical History:  ?Procedure Laterality Date  ? CORONARY STENT PLACEMENT  02/25/2014  ? LEFT HEART CATHETERIZATION WITH CORONARY ANGIOGRAM N/A 02/25/2014  ? Procedure: LEFT HEART CATHETERIZATION WITH CORONARY ANGIOGRAM;  Surgeon: HSinclair Grooms MD;  [facility];  Service:  Cardiovascular;  Laterality: N/A;  ? PERCUTANEOUS CORONARY STENT INTERVENTION (PCI-S)  02/25/2014  ? Procedure: PERCUTANEOUS CORONARY STENT INTERVENTION (PCI-S);  Surgeon: HSinclair Grooms MD;  [facility];  Serv

## 2022-01-03 ENCOUNTER — Ambulatory Visit: Payer: Medicare PPO | Admitting: Physician Assistant

## 2022-01-03 LAB — ALPHA-GAL PANEL
Allergen Lamb IgE: 6.35 kU/L — AB
Beef IgE: 6.08 kU/L — AB
IgE (Immunoglobulin E), Serum: 855 IU/mL — ABNORMAL HIGH (ref 6–495)
O215-IgE Alpha-Gal: 53.8 kU/L — AB
Pork IgE: 0.58 kU/L — AB

## 2022-01-04 NOTE — Progress Notes (Signed)
Can you please let this patient know that his alpha gal numbers are still very elevated.

## 2022-01-08 ENCOUNTER — Telehealth: Payer: Self-pay | Admitting: Family Medicine

## 2022-01-08 NOTE — Telephone Encounter (Signed)
Pt has been informed of his lab work for alpha gal. Patient verbalized understanding.  ?

## 2022-01-11 ENCOUNTER — Other Ambulatory Visit: Payer: Self-pay | Admitting: Interventional Cardiology

## 2022-01-23 ENCOUNTER — Telehealth: Payer: Self-pay

## 2022-01-23 NOTE — Telephone Encounter (Signed)
Patient's wife called in for a support for his ankle, was unsure if she needed to speak with orthotics department or Dr. Paulla Dolly directly. Please advise.

## 2022-02-06 ENCOUNTER — Other Ambulatory Visit: Payer: Self-pay | Admitting: Allergy and Immunology

## 2022-02-07 ENCOUNTER — Ambulatory Visit: Payer: Medicare PPO | Admitting: Physician Assistant

## 2022-02-27 ENCOUNTER — Other Ambulatory Visit: Payer: Self-pay | Admitting: Allergy and Immunology

## 2022-03-07 ENCOUNTER — Other Ambulatory Visit: Payer: Self-pay | Admitting: Interventional Cardiology

## 2022-03-13 ENCOUNTER — Ambulatory Visit (INDEPENDENT_AMBULATORY_CARE_PROVIDER_SITE_OTHER): Payer: Medicare PPO | Admitting: Physician Assistant

## 2022-03-13 ENCOUNTER — Encounter: Payer: Self-pay | Admitting: Physician Assistant

## 2022-03-13 VITALS — BP 138/84 | HR 61 | Resp 18 | Ht 69.0 in | Wt 160.0 lb

## 2022-03-13 DIAGNOSIS — G3184 Mild cognitive impairment, so stated: Secondary | ICD-10-CM | POA: Diagnosis not present

## 2022-03-13 MED ORDER — MEMANTINE HCL 5 MG PO TABS
5.0000 mg | ORAL_TABLET | Freq: Every day | ORAL | 3 refills | Status: DC
Start: 2022-03-13 — End: 2023-02-18

## 2022-03-13 NOTE — Progress Notes (Signed)
Assessment/Plan:   Mild Cognitive Impairment   Eric Cortez is a very pleasant 71 y.o. RH malepresenting today in follow-up for evaluation of memory loss.  Last MoCA was 22/30.  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  No age advanced or lobar predominant parenchymal atrophy.  He is on memantine 5 mg nightly, tolerating well.   Recommendations:   Continue memantine 5 mg nightly.  Side effects were discussed Recommend that wife makes a phone call with Levonne Lapping, Social Worker, to discuss dementia counseling, including caregiver distress, community resources, etc Recommend good control of cardiovascular risk factors Recommend to control mood by PCP Keep the appointment with neurocognitive testing, for clarity of the diagnosis and trajectory. Follow up in 6 months.   Case discussed with Dr. Delice Lesch who agrees with the plan    Subjective:   This patient is accompanied in the office by his wife who supplements the history.  Previous records as well as any outside records available were reviewed prior to todays visit.  Patient was last seen at our office on 12/31/2021.  Last MoCA was 22/30   Any changes in memory since last visit?  "I notice a slight improvement, retain a little bit better". Continues to do sodoku and word finding  Patient lives with: Wife repeats oneself?  Endorsed.  Disoriented when walking into a room?  Patient denies   Leaving objects in unusual places?  Patient denies   Ambulates  with difficulty?   Patient denies   Recent falls?  Patient denies   Any head injuries?  Patient denies   History of seizures?   Patient denies   Wandering behavior?  Patient denies   Patient drives?   Patient drives, denies getting lost.  He uses GPS to drive if is unknown location  any mood changes such irritability agitation? Mood is  better. It has been recent construction in end wife reports that they have fallen victims of a scam by their contractor, which added stress.   Any depression?:  Patient denies   Hallucinations?  Patient denies   Paranoia?  Patient denies   Patient reports that he sleeps well "actually better than better with memantine" .  She also reports that he moves less, "it helped with the body jerks at night ".  Denies vivid dreams, REM behavior or sleepwalking.  May need to wake up once to urinate. History of sleep apnea?  Patient denies   Any hygiene concerns?  Patient denies   Independent of bathing and dressing?  Endorsed  Does the patient needs help with medications?  Denies Who is in charge of the finances?  Patient is in charge    Any changes in appetite?  Patient denies   Patient have trouble swallowing? Patient denies   Does the patient cook?  Patient denies   Any kitchen accidents such as leaving the stove on? Patient denies   Any headaches?  Patient denies   Double vision? Patient denies   Any focal numbness or tingling?  Patient denies   Chronic back pain Patient denies   Unilateral weakness?  Patient denies   Any tremors?  Patient denies   Any history of anosmia?  Patient denies   Any incontinence of urine?  He has a history of BPH and frequency.   Any bowel dysfunction?   Patient denies  Initial Visit 11/29/21 The patient is seen in neurologic consultation at the request of Newman Pies, MD for the evaluation of memory.  The patient is accompanied by  who supplements the history. This is a 71 y.o. year old RH  male who has had memory issues for about 2 years, although he says that he is memory "always has not trouble, all my life, I am hard of hearing is a test I cannot understand ".  Currently, he is not using his hearing aids.  His wife states that he does repeat himself, and forgets recent conversations.  He never was diagnosed with ADD or ADHD, although his wife does  suspect that he has it.  He denies being disoriented when walking into her room or moving objects in unusual places, however he "loses things constantly, and is always trying to find staff ".  He ambulates without difficulty, recently, he has been under the care of his neurosurgeon, for different areas of chronic pain, and spondylolisthesis, and he denies any worsening pain at this time, he is able to walk well, without any falls.  He does not need a cane or a walker to ambulate.  He denies any broad-based gait.  At age 19, he had a head injury when falling from a tree, which left the right parietal frontal area scar.  He also has being hit by a baseball when he was younger "cry on top of my head and he left the soft spot ".  He denies having had meningitis or encephalitis as a child, with a difficult birth.  He drives, and his wife states that sometimes he is still taking the shortcut, he takes the loan card, "he does not listen to me ".  This frustrates her.  He lives with her, and his mood is overall stable, denies any depression or irritability.  His sleep varies, he has occasional vivid dreams.  His wife states that he does not do sleepwalking, or REM behavior, however he has had moments in which she thought that he was having a seizure, he had jerky movements and possible restless leg syndrome.  His doctor placed him on a muscle relaxer, which has helped him.  He denies any history of seizures.  He denies any hallucinations or paranoia, there are no hygiene concerns, he is independent of bathing and dressing.  His medications are in a pillbox, he denies missing any doses.  He is in charge of his finances.  His appetite is good, denies trouble swallowing, he does have mild increase in production of saliva.  He does not cook.  He denies any headaches, double vision, dizziness, focal numbness or tingling, unilateral weakness, or anosmia.  He has a history of mild intention tremors for the last 7 or 8 months.  He  says that at times if he is left hand is weak, he gets muscle jerks, he drops objects.  He denies micrographia, he denies any voice changes.  He also reports muscle cramps over the last 6 months, "off and on and worse over the last month ".  He still able to do his activities of daily living. He denies any urine incontinence, retention, constipation or diarrhea.  Denies any history of sleep apnea, alcohol or tobacco.  He denies any history of melanoma.  Family history negative for dementia or Parkinson's disease.   MRI brain 12/20/21 No evidence of acute intracranial abnormality.Moderate-to-advanced chronic small vessel ischemic changes within the cerebral white matter. Mild chronic small vessel ischemic changes  are also present within the pons and right middle cerebellar peduncle.No age advanced or lobar predominant parenchymal atrophy.   EEG 12/2021 was normal  No Known Allergies    Past Medical History:  Diagnosis Date   Asthma    Atypical mole 2014   moderate left chest   CAD (coronary artery disease)    a. 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 01/2014 Echo: EF 45-50%, inferoseptal/inf sev HK, Gr 1 DD, nl valves.   Myocardial infarction ([facility]    Urticaria      Past Surgical History:  Procedure Laterality Date   CORONARY STENT PLACEMENT  02/25/2014   LEFT HEART CATHETERIZATION WITH CORONARY ANGIOGRAM N/A 02/25/2014   Procedure: LEFT HEART CATHETERIZATION WITH CORONARY ANGIOGRAM;  Surgeon: HSinclair Grooms MD;  [facility];  Service: Cardiovascular;  Laterality: N/A;   PERCUTANEOUS CORONARY STENT INTERVENTION (PCI-S)  02/25/2014   Procedure: PERCUTANEOUS CORONARY STENT INTERVENTION (PCI-S);  Surgeon: HSinclair Grooms MD;  [facility];  Service: Cardiovascular;;    TONSILLECTOMY       PREVIOUS MEDICATIONS:   CURRENT MEDICATIONS:  Outpatient Encounter Medications as of 03/13/2022  Medication Sig   albuterol (PROVENTIL) (2.5 MG/3ML) 0.083% nebulizer solution Take 3 mLs (2.5 mg total) by nebulization every 4 (four) hours as needed for wheezing or shortness of breath. Use one vial in nebulizer every four to six hours as needed for cough or wheeze.   albuterol (VENTOLIN HFA) 108 (90 Base) MCG/ACT inhaler INHALE 2 PUFFS EVERY 6 HOURS AS NEEDED FOR WHEEZING OR SHORTNESS OF BREATH   alfuzosin (UROXATRAL) 10 MG 24 hr tablet Take 1 tablet (10 mg total) by mouth daily.   aspirin EC 81 MG tablet Take 1 tablet (81 mg total) by mouth daily. Please make yearly appt with Dr. STamala Julianfor April 2023 for future refills. Thank you 1st attempt   atorvastatin (LIPITOR) 20 MG tablet Take 1 tablet (20 mg total) by mouth daily.   azelastine (ASTELIN) 0.1 % nasal spray Place 1 spray into both nostrils 2 (two) times daily. Use in each nostril as directed   budesonide (PULMICORT) 180 MCG/ACT inhaler Inhale 2 puffs into the lungs 2 (two) times daily as needed (shortness of breath). Rinse, gargle, and spit after use.   buPROPion (WELLBUTRIN XL) 150 MG 24 hr tablet Take 1 tablet by mouth daily.   Coenzyme Q10 (COQ-10) 150 MG CAPS Take 150 mg by mouth daily.   cyclobenzaprine (FLEXERIL) 5 MG tablet Take by mouth.   EPINEPHrine 0.3 mg/0.3 mL IJ SOAJ injection Use for life-threatening allergic reactions   FLOVENT HFA 110 MCG/ACT inhaler Inhale 2 puffs into the lungs in the morning and at bedtime.   fluticasone (FLONASE) 50 MCG/ACT nasal spray USE 1 TO 2 SPRAYS IN EACH NOSTRIL EVERY DAY AS NEEDED   melatonin 5 MG TABS Take 5 mg by mouth at bedtime as needed (sleep).   memantine (NAMENDA) 5 MG tablet Take 1 tablet (5 mg total) by mouth daily.   nitroGLYCERIN (NITROSTAT) 0.4 MG SL tablet Place 1 tablet (0.4 mg total) under the tongue every 5 (five) minutes x 3 doses as needed for chest  pain.   omalizumab (Arvid Right 150 MG/ML prefilled syringe Inject 300 mg into  the skin every 28 (twenty-eight) days.   Facility-Administered Encounter Medications as of 03/13/2022  Medication   omalizumab Arvid Right) injection 300 mg     Objective:     PHYSICAL EXAMINATION:    VITALS:   Vitals:   03/13/22 1105  BP: 138/84  Pulse: 61  Resp: 18  SpO2: 96%  Weight: 160 lb (72.6 kg)  Height: '5\' 9"'$  (1.753 m)    GEN:  The patient appears stated age and is in NAD. HEENT:  Normocephalic, atraumatic.   Neurological examination:  General: NAD, well-groomed, appears stated age. Orientation: The patient is alert. Oriented to person, place and date Cranial nerves: There is good facial symmetry.The speech is fluent and clear. No aphasia or dysarthria. Fund of knowledge is appropriate. Recent memory impaired and remote memory is normal.  Attention and concentration are normal.  Able to name objects and repeat phrases.  Hearing is intact to conversational tone.    Sensation: Sensation is intact to [place] touch throughout Motor: Strength is at least antigravity x4. Tremors: none  DTR's 2/4 in UE/LE      11/29/2021    3:00 PM  Montreal Cognitive Assessment   Visuospatial/ Executive (0/5) 4  Naming (0/3) 3  Attention: Read list of digits (0/2) 2  Attention: Read list of letters (0/1) 1  Attention: Serial 7 subtraction starting at 100 (0/3) 3  Language: Repeat phrase (0/2) 1  Language : Fluency (0/1) 0  Abstraction (0/2) 1  Delayed Recall (0/5) 1  Orientation (0/6) 6  Total 22  Adjusted Score (based on education) 22        No data to display             Movement examination: Tone: There is normal tone in the UE/LE Abnormal movements:  no tremor.  No myoclonus.  No asterixis.   Coordination:  There is no decremation with RAM's. Normal finger to nose  Gait and Station: The patient has no difficulty arising out of a deep-seated chair without the use of the hands. The patient's  stride length is good.  Gait is cautious and narrow.   Thank you for allowing [place] the opportunity to participate in the care of this nice patient. Please do not hesitate to contact us for any questions or concerns.   Total time spent on today's visit was 35 minutes dedicated to this patient today, preparing to see patient, examining the patient, ordering tests and/or medications and counseling the patient, documenting clinical information in the EHR or other health record, independently interpreting results and communicating results to the patient/family, discussing treatment and goals, answering patient's questions and coordinating care.  Cc:  Curlene Labrum, MD  Sharene Butters 03/13/2022 11:18 AM   Cc:  Curlene Labrum, MD Sharene Butters, PA-C

## 2022-03-13 NOTE — Patient Instructions (Addendum)
It was a pleasure to see you today at our office.   Recommendations:  Keep the neurocognitive testing  Continue Memantine 5 mg tablets daily.   Follow up in  6 month  Jan 12 11:30 It was a pleasure to see you today at our office.    Whom to call:  Memory  decline, memory medications: Call our office [phone]   For psychiatric meds, mood meds: Please have your primary care physician manage these medications.   Counseling regarding caregiver distress, including caregiver depression, anxiety and issues regarding community resources, adult day care programs, adult living facilities, or memory care questions:   Feel free to contact [place], Social Worker at [phone]   For assessment of decision of mental capacity and competency:  Call Dr. Anthoney Harada, geriatric psychiatrist at [phone]  For guidance in geriatric dementia issues please call [facility]    If you have any severe symptoms of a stroke, or other severe issues such as confusion,severe chills or fever, etc call 911 or go to the ER as you may need to be evaluated further   Feel free to visit Facebook page " [email or web address]" for tips of how to care for people with memory problems.     RECOMMENDATIONS FOR ALL PATIENTS WITH MEMORY PROBLEMS: 1. Continue to exercise (Recommend 30 minutes of walking everyday, or 3 hours every week) 2. Increase social interactions - continue going to [place] and enjoy social gatherings with friends and family 3. Eat healthy, avoid fried foods and eat more fruits and vegetables 4. Maintain adequate blood pressure, blood sugar, and blood cholesterol level. Reducing the risk of stroke and cardiovascular disease also helps promoting better memory. 5. Avoid stressful situations. Live a simple life and avoid aggravations. Organize your time and prepare for the next day in anticipation. 6. Sleep well, avoid any interruptions of sleep and avoid any distractions in the bedroom  that may interfere with adequate sleep quality 7. Avoid sugar, avoid sweets as there is a strong link between excessive sugar intake, diabetes, and cognitive impairment We discussed the Mediterranean diet, which has been shown to help patients reduce the risk of progressive memory disorders and reduces cardiovascular risk. This includes eating fish, eat fruits and green leafy vegetables, nuts like almonds and hazelnuts, walnuts, and also use olive oil. Avoid fast foods and fried foods as much as possible. Avoid sweets and sugar as sugar use has been linked to worsening of memory function.  There is always a concern of gradual progression of memory problems. If this is the case, then we may need to adjust level of care according to patient needs. Support, both to the patient and caregiver, should then be put into place.    FALL PRECAUTIONS: Be cautious when walking. Scan the area for obstacles that may increase the risk of trips and falls. When getting up in the mornings, sit up at the edge of the bed for a few minutes before getting out of bed. Consider elevating the bed at the head end to avoid drop of blood pressure when getting up. Walk always in a well-lit room (use night lights in the walls). Avoid area rugs or power cords from appliances in the middle of the walkways. Use a walker or a cane if necessary and consider physical therapy for balance exercise. Get your eyesight checked regularly.  FINANCIAL OVERSIGHT: Supervision, especially oversight when making financial decisions or transactions is also recommended.  HOME SAFETY: Consider the safety of the kitchen  when operating appliances like stoves, microwave oven, and blender. Consider having supervision and share cooking responsibilities until no longer able to participate in those. Accidents with firearms and other hazards in the house should be identified and addressed as well.   ABILITY TO BE LEFT ALONE: If patient is unable to contact 911  operator, consider using [employer], or when the need is there, arrange for someone to stay with patients. Smoking is a fire hazard, consider supervision or cessation. Risk of wandering should be assessed by caregiver and if detected at any point, supervision and safe proof recommendations should be instituted.  MEDICATION SUPERVISION: Inability to self-administer medication needs to be constantly addressed. Implement a mechanism to ensure safe administration of the medications.   DRIVING: Regarding driving, in patients with progressive memory problems, driving will be impaired. We advise to have someone else do the driving if trouble finding directions or if minor accidents are reported. Independent driving assessment is available to determine safety of driving.   If you are interested in the driving assessment, you can contact the following:  [employer] in [place]  [place] [phone]  [place]  [facility] [phone] or [phone]

## 2022-03-19 ENCOUNTER — Ambulatory Visit: Payer: Medicare PPO | Admitting: Urology

## 2022-03-27 ENCOUNTER — Encounter: Payer: Self-pay | Admitting: Psychology

## 2022-03-27 DIAGNOSIS — H903 Sensorineural hearing loss, bilateral: Secondary | ICD-10-CM | POA: Insufficient documentation

## 2022-03-27 DIAGNOSIS — R251 Tremor, unspecified: Secondary | ICD-10-CM | POA: Insufficient documentation

## 2022-03-27 DIAGNOSIS — R972 Elevated prostate specific antigen [PSA]: Secondary | ICD-10-CM | POA: Insufficient documentation

## 2022-03-27 DIAGNOSIS — M21379 Foot drop, unspecified foot: Secondary | ICD-10-CM | POA: Insufficient documentation

## 2022-03-27 DIAGNOSIS — I10 Essential (primary) hypertension: Secondary | ICD-10-CM | POA: Insufficient documentation

## 2022-03-27 DIAGNOSIS — H2513 Age-related nuclear cataract, bilateral: Secondary | ICD-10-CM | POA: Insufficient documentation

## 2022-03-27 DIAGNOSIS — H04123 Dry eye syndrome of bilateral lacrimal glands: Secondary | ICD-10-CM | POA: Insufficient documentation

## 2022-03-27 DIAGNOSIS — M48061 Spinal stenosis, lumbar region without neurogenic claudication: Secondary | ICD-10-CM | POA: Insufficient documentation

## 2022-03-27 DIAGNOSIS — L989 Disorder of the skin and subcutaneous tissue, unspecified: Secondary | ICD-10-CM | POA: Insufficient documentation

## 2022-03-27 DIAGNOSIS — M419 Scoliosis, unspecified: Secondary | ICD-10-CM | POA: Insufficient documentation

## 2022-03-28 ENCOUNTER — Ambulatory Visit: Payer: Medicare PPO | Admitting: Psychology

## 2022-03-28 ENCOUNTER — Ambulatory Visit (INDEPENDENT_AMBULATORY_CARE_PROVIDER_SITE_OTHER): Payer: Medicare PPO | Admitting: Psychology

## 2022-03-28 ENCOUNTER — Encounter: Payer: Self-pay | Admitting: Psychology

## 2022-03-28 DIAGNOSIS — R4189 Other symptoms and signs involving cognitive functions and awareness: Secondary | ICD-10-CM | POA: Diagnosis not present

## 2022-03-28 DIAGNOSIS — F419 Anxiety disorder, unspecified: Secondary | ICD-10-CM | POA: Diagnosis not present

## 2022-03-28 DIAGNOSIS — I6781 Acute cerebrovascular insufficiency: Secondary | ICD-10-CM | POA: Diagnosis not present

## 2022-03-28 NOTE — Progress Notes (Signed)
NEUROPSYCHOLOGICAL EVALUATION [place] [place] Department of Neurology  Date of Evaluation: March 28, 2022  Reason for Referral:   Eric Cortez is a 71 y.o. right-handed Caucasian male referred by  Sharene Butters, PA-C , to characterize his current cognitive functioning and assist with diagnostic clarity and treatment planning in the context of subjective cognitive decline.   Assessment and Plan:   Clinical Impression(s): Eric Cortez pattern of performance is suggestive of an isolated impairment surrounding phonemic fluency. However, all other aspects of expressive language were appropriate. Performances were also appropriate relative to age-matched peers across all other assessed cognitive domains. This includes processing speed, attention/concentration, executive functioning, safety/judgment, receptive language, visuospatial abilities, and all aspects of learning and memory. Eric Cortez denied difficulties completing instrumental activities of daily living (ADLs) independently. He does not warrant consideration of a neurocognitive disorder at the present time.   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 Subjective day-to-day dysfunction may also be influenced by uncorrected hearing loss, ongoing anxiety, and, as reported by his wife, the fact that they have been under a "tremendous amount of stress" lately. This could certainly increase the risk for inefficient learning of new information and the experience of attentional and memory lapses, especially with heightened stress or when trying to multi-task. 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 However, current scores are not at all concerning for Lewy body  dementia at the present time. Memory scores are not at all concerning for Alzheimer's disease. I also see no compelling evidence for frontotemporal lobar degeneration or a more rare parkinsonian syndrome at the present time. Continued medical monitoring will be important moving forward.   Recommendations: Should he or his wife observe a significant cognitive and/or functional decline in the future, a repeat neuropsychological evaluation would be warranted at that time. The current evaluation will serve as an excellent baseline for comparison purposes.  A laboratory sleep study might be required to formally diagnose a REM sleep behavior disorder. While his wife reported that the addition of Namenda/memantine has seemingly improved these symptoms, a formal diagnosis might be beneficial in him receiving treatment more directly related to that condition. He should discuss this referral with his neurologist or PCP.   Eric Cortez is encouraged to attend to lifestyle factors for brain health (e.g., regular physical exercise, good nutrition habits, regular participation in cognitively-stimulating activities, and general stress management techniques), which are likely to have benefits for both emotional adjustment and cognition. In fact, in addition to promoting good general health, regular exercise incorporating aerobic activities (e.g., brisk walking, jogging, cycling, etc.) has been demonstrated to be a very effective treatment for depression and stress, with similar efficacy rates to both antidepressant medication and psychotherapy. Optimal control of vascular risk factors (including safe cardiovascular exercise and adherence to dietary recommendations) is encouraged. Continued participation in activities which provide mental stimulation and social interaction is also recommended.   Memory can be improved using internal strategies such as rehearsal, repetition, chunking, mnemonics, association, and imagery.  External strategies such as written notes in a consistently used memory journal, visual and nonverbal auditory cues such as a calendar on the refrigerator or appointments with alarm, such as on a cell phone, can also help maximize recall.    Reducing stress and anxiety should  also aid in the retrieval of information.   To address problems with fluctuating attention, he may wish to consider:   -Avoiding external distractions when needing to concentrate   -Limiting exposure to fast paced environments with multiple sensory demands   -Writing down complicated information and using checklists   -Attempting and completing one task at a time (i.e., no multi-tasking)   -Verbalizing aloud each step of a task to maintain focus   -Reducing the amount of information considered at one time  Review of Records:   Eric Cortez was seen by [facility] Sharene Butters, PA-C) on 11/29/2021 for an evaluation of memory loss. Memory dysfunction was said to be present for the past two or so years. There is ongoing hearing loss and he rarely utilizes his hearing aids. He and his wife reported a longstanding history of inattention and distractibility. No balance or mood concerns were reported. No REM sleep behaviors were reported; however, his wife did report prior experiences where he will jerk in his sleep to the extent she wondered if he experienced a seizure. ADLs were described as intact. Performance on a brief cognitive screening instrument (MOCA) was 22/30. Ultimately, Eric Cortez was referred for a comprehensive neuropsychological evaluation to characterize his cognitive abilities and to assist with diagnostic clarity and treatment planning.   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 No age advanced or lobar predominant atrophy was noted.   Past Medical History:  Diagnosis Date   Acquired scoliosis    Age-related nuclear cataract, bilateral     Allergic reaction to alpha-gal 12/31/2021   Allergy to meat 09/12/2015   Arthritis, degenerative 07/06/2002   Asthma    Atypical mole 2014   moderate left chest   CAD (coronary artery disease)    a. 01/2014 Inf STEMI/PCI: LM nl, LAD 50p/m, LCX 30-7m OM1 50, OM2/3/4 nl, RCA (anomalous) 100p (3.5x23 Xience Alpine DES), EF 60;    Chest pain 10/19/2014   Chronic idiopathic urticaria 12/31/2021   Dry eye syndrome of bilateral lacrimal glands    Elevated PSA    Family history of malignant neoplasm of gastrointestinal tract 07/06/2002   Foot-drop    Hyperlipidemia, unspecified 07/06/2002   IMO Load 2018 R1.1   Hypertension    Ischemic cardiomyopathy    ab. 01/2014 Echo: EF 45-50%, inferoseptal/inf sev HK, Gr 1 DD, nl valves.   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   Pain in finger of left hand 08/29/2016   Perennial allergic rhinitis 12/31/2021   Presence of stent in right coronary artery 08/08/2016   Sensorineural hearing loss, bilateral    Skin lesion    Spinal stenosis of lumbar region    Spondylolisthesis, lumbar region 06/25/2021   Tremor     Past Surgical History:  Procedure Laterality Date   CORONARY STENT PLACEMENT  02/25/2014   LEFT HEART CATHETERIZATION WITH CORONARY ANGIOGRAM N/A 02/25/2014   Procedure: LEFT HEART CATHETERIZATION WITH CORONARY ANGIOGRAM;  Surgeon: HSinclair Grooms MD;  [facility];  Service: Cardiovascular;  Laterality: N/A;   PERCUTANEOUS CORONARY STENT INTERVENTION (PCI-S)  02/25/2014   Procedure: PERCUTANEOUS CORONARY STENT INTERVENTION (PCI-S);  Surgeon: HSinclair Grooms MD;  [facility];  Service: Cardiovascular;;   TONSILLECTOMY      Current Outpatient Medications:    albuterol (PROVENTIL) (2.5 MG/3ML) 0.083% nebulizer solution, Take 3 mLs (2.5 mg total) by nebulization every 4 (four) hours as  needed for wheezing or shortness of breath. Use one vial in nebulizer every four to six hours as  needed for cough or wheeze., Disp: 250 mL, Rfl: 1   albuterol (VENTOLIN HFA) 108 (90 Base) MCG/ACT inhaler, INHALE 2 PUFFS EVERY 6 HOURS AS NEEDED FOR WHEEZING OR SHORTNESS OF BREATH, Disp: 54 g, Rfl: 0   alfuzosin (UROXATRAL) 10 MG 24 hr tablet, Take 1 tablet (10 mg total) by mouth daily., Disp: 90 tablet, Rfl: 3   aspirin EC 81 MG tablet, Take 1 tablet (81 mg total) by mouth daily. Please make yearly appt with Dr. Tamala Julian for April 2023 for future refills. Thank you 1st attempt, Disp: 90 tablet, Rfl: 0   atorvastatin (LIPITOR) 20 MG tablet, Take 1 tablet (20 mg total) by mouth daily., Disp: 15 tablet, Rfl: 0   azelastine (ASTELIN) 0.1 % nasal spray, Place 1 spray into both nostrils 2 (two) times daily. Use in each nostril as directed, Disp: 30 mL, Rfl: 3   budesonide (PULMICORT) 180 MCG/ACT inhaler, Inhale 2 puffs into the lungs 2 (two) times daily as needed (shortness of breath). Rinse, gargle, and spit after use., Disp: 3 each, Rfl: 1   buPROPion (WELLBUTRIN XL) 150 MG 24 hr tablet, Take 1 tablet by mouth daily., Disp: , Rfl:    Coenzyme Q10 (COQ-10) 150 MG CAPS, Take 150 mg by mouth daily., Disp: , Rfl:    cyclobenzaprine (FLEXERIL) 5 MG tablet, Take by mouth., Disp: , Rfl:    EPINEPHrine 0.3 mg/0.3 mL IJ SOAJ injection, Use for life-threatening allergic reactions, Disp: 4 each, Rfl: 3   FLOVENT HFA 110 MCG/ACT inhaler, Inhale 2 puffs into the lungs in the morning and at bedtime., Disp: 12 g, Rfl: 5   fluticasone (FLONASE) 50 MCG/ACT nasal spray, USE 1 TO 2 SPRAYS IN EACH NOSTRIL EVERY DAY AS NEEDED, Disp: 48 g, Rfl: 0   melatonin 5 MG TABS, Take 5 mg by mouth at bedtime as needed (sleep)., Disp: , Rfl:    memantine (NAMENDA) 5 MG tablet, Take 1 tablet (5 mg total) by mouth daily., Disp: 90 tablet, Rfl: 3   nitroGLYCERIN (NITROSTAT) 0.4 MG SL tablet, Place 1 tablet (0.4 mg total) under the tongue every 5 (five) minutes x 3 doses as needed for chest pain., Disp: 25 tablet, Rfl: 3   omalizumab  (XOLAIR) 150 MG/ML prefilled syringe, Inject 300 mg into the skin every 28 (twenty-eight) days., Disp: 2 mL, Rfl: 11  Current Facility-Administered Medications:    omalizumab Arvid Right) injection 300 mg, 300 mg, Subcutaneous, Q28 days, Kozlow, Donnamarie Poag, MD, 300 mg at 10/06/20 1550  Clinical Interview:   The following information was obtained during a clinical interview with Eric Cortez and his wife prior to cognitive testing.  Cognitive Symptoms: Decreased short-term memory: Endorsed. He reported somewhat longstanding memory deficits, but noted that difficulties do seem worse during the recent past. His wife estimated that memory changes have been ongoing for the past 18 months. Examples included trouble recalling details of past conversations and asking repetitive questions.  Decreased long-term memory: Denied. Decreased attention/concentration: Endorsed. Trouble with sustained attention and distractibility was said to be longstanding in nature. However, both he and his wife noted that it did seem to have worsened lately.  Reduced processing speed: Variably so, depending on the task at hand.  Difficulties with executive functions: Endorsed. He reported longstanding difficulties with multi-tasking and organization. His wife highlighted that these abilities also seem to have worsened over time. He denied trouble with  impulsivity or any significant personality changes. His wife was in agreement with this. However, she did highlight that he has seemed more reserved or introverted than typical.  Difficulties with emotion regulation: Denied. Difficulties with receptive language: Denied. Difficulties with word finding: Endorsed. Decreased visuoperceptual ability: Denied.  Difficulties completing ADLs: Denied.  Additional Medical History: History of traumatic brain injury/concussion: He reported falling out a tree when he was around 71 years old. He denied experiencing a loss in consciousness. No other  potential head injuries were reported.  History of stroke: Denied. History of seizure activity: Denied. History of known exposure to toxins: Denied. Symptoms of chronic pain: Denied outside of diffuse arthritic pain.  Experience of frequent headaches/migraines: Denied. Frequent instances of dizziness/vertigo: Denied.  Sensory changes: He reported ongoing hearing loss. He stated that he lost prior hearing aids and has been waiting on the [place] to ship a new pair. Records suggest minimal hearing aid use in the past. Other sensory changes/difficulties (e.g., vision, taste, smell) was denied.  Balance/coordination difficulties: Denied. He reported needing to stabilize himself after standing up but denied any recent falls or ongoing weakness.  Other motor difficulties: Endorsed. He reported an action tremor in his hands bilaterally (L > R).   Sleep History: Estimated hours obtained each night: 7-8 hours.  Difficulties falling asleep: Endorsed "sometimes." Difficulties staying asleep: Denied. Feels rested and refreshed upon awakening: Endorsed "most of the time."  History of snoring: Denied. History of waking up gasping for air: Denied. Witnessed breath cessation while asleep: Denied.  History of vivid dreaming: Denied. Excessive movement while asleep: Endorsed. His wife reported significant "thrashing" while asleep to the extent she has feared that Eric Cortez was experiencing regular grand mal seizures. These behaviors have dissipated recently, especially since being started on a "memory medication" in the recent past.  Instances of acting out his dreams: Denied outside what is described above.   Psychiatric/Behavioral Health History: Depression: Denied. He likewise denied current or remote suicidal ideation, intent, or plan.  Anxiety: Denied. However, his wife noted that the both of them have been under "a tremendous amount of stress" lately due to trouble with contractors involved in building  their new home.  Mania: Denied. Trauma History: Denied. Visual/auditory hallucinations: Denied. Delusional thoughts: Denied.  Tobacco: Denied. Alcohol: He reported consuming an occasional glass of wine and denied a history of problematic alcohol abuse or dependence.  Recreational drugs: Denied.  Family History: Problem Relation Age of Onset   Colon cancer Mother    Memory loss Mother    Heart attack Father    Hypertension Father    Heart disease Father    Diabetes Father    Diverticulosis Sister    Stroke Paternal Grandmother    Stroke Paternal Grandfather    Heart disease Maternal Aunt    Schizophrenia Maternal Aunt    This information was confirmed by Mr. Sass.  Academic/Vocational History: Highest level of educational attainment: 12 years. He described himself as an average (B/C) student in academic settings. No specific weaknesses were identified; however, he did comment that "everything was a struggle" in earlier academic settings.  History of developmental delay: Denied. History of grade repetition: Denied. Enrollment in special education courses: Denied. History of LD/ADHD: Denied.  Employment: Retired. 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   Evaluation Results:   Behavioral Observations: Eric Cortez was accompanied by his wife, arrived to his appointment on time, and was appropriately dressed and groomed. He appeared  alert and oriented. Observed gait and station were within normal limits. Gross motor functioning appeared intact upon informal observation and no abnormal movements (e.g., tremors) were noted. His affect was generally relaxed and positive, but did range appropriately given the subject being discussed during the clinical interview or the task at hand during testing procedures. Spontaneous speech was fluent and word finding difficulties were not observed during the clinical interview. Thought processes were coherent,  organized, and normal in content. Insight into his cognitive difficulties appeared adequate.   During testing, sustained attention was appropriate. Task engagement was adequate and he persisted when challenged. Interestingly, when asked about color-blindness during interview, he and his wife both denied concerns/symptoms. 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 As such, this task was not attempted. He did not report or appear to have difficulty across other tasks which utilized various colors. No tremors were observed throughout testing, even while completing tasks with fine motor requirements. Overall, Eric Cortez was cooperative with the clinical interview and subsequent testing procedures.   Adequacy of Effort: The validity of neuropsychological testing is limited by the extent to which the individual being tested may be assumed to have exerted adequate effort during testing. Eric Cortez expressed his intention to perform to the best of his abilities and exhibited adequate task engagement and persistence. Scores across stand-alone and embedded performance validity measures were within expectation. As such, the results of the current evaluation are believed to be a valid representation of Eric Cortez current cognitive functioning.  Test Results: Eric Cortez was fully oriented at the time of the current evaluation.  Intellectual abilities based upon educational and vocational attainment were estimated to be in the below average to average range. Premorbid abilities were estimated to be within the average range based upon a single-word reading test.   Processing speed was average. Basic attention was average. More complex attention (e.g., working memory) was below average. Executive functioning was average. He also scored in the exceptionally high range across a task assessing safety and  judgment.  Assessed receptive language abilities were above average. Likewise, Eric Cortez did not exhibit any difficulties comprehending task instructions and answered all questions asked of him appropriately. Assessed expressive language was variable. Phonemic fluency was well below average, semantic fluency was average, and confrontation naming was above average.     Assessed visuospatial/visuoconstructional abilities were average to above average.    Learning (i.e., encoding) of novel verbal and visual information was average to well above average. Spontaneous delayed recall (i.e., retrieval) of previously learned information was average to above average. Retention rates were 107% across a story learning task, 100% across a list learning task, and 100% across a shape learning task. Performance across recognition tasks was average to above average, suggesting evidence for information consolidation.   Results of emotional screening instruments suggested that recent symptoms of generalized anxiety were in the mild range, while symptoms of depression were within normal limits. A screening instrument assessing recent sleep quality suggested the presence of minimal sleep dysfunction.  Tables of Scores:   Note: This summary of test scores accompanies the interpretive report and should not be considered in isolation without reference to the appropriate sections in the text. Descriptors are based on appropriate normative data and may be adjusted based on clinical judgment. Terms such as "Within Normal Limits" and "Outside Normal Limits" are used when a more specific description of the  test score cannot be determined.       Percentile - Normative Descriptor > 98 - Exceptionally High 91-97 - Well Above Average 75-90 - Above Average 25-74 - Average 9-24 - Below Average 2-8 - Well Below Average < 2 - Exceptionally Low       Orientation:      Raw Score Percentile   NAB Orientation, Form 1 29/29 --- ---        Cognitive Screening:      Raw Score Percentile   SLUMS: 29/30 --- ---       Intellectual Functioning:      Standard Score Percentile   Barona Formula Estimated Premorbid IQ: 104 61 Average        Standard Score Percentile   Test of Premorbid Functioning: 100 50 Average       Memory:     NAB Memory Module, Form 1: Standard Score/ T Score Percentile   Total Memory Index 107 68 Average  List Learning       Total Trials 1-3 16/36 (44) 27 Average    List B 2/12 (41) 18 Below Average    Short Delay Free Recall 5/12 (46) 34 Average    Long Delay Free Recall 5/12 (48) 42 Average    Retention Percentage 100 (52) 58 Average    Recognition Discriminability 7 (51) 54 Average  Shape Learning       Total Trials 1-3 19/27 (65) 93 Well Above Average    Delayed Recall 8/9 (70) 98 Exceptionally High    Retention Percentage 100 (50) 50 Average    Recognition Discriminability 8 (57) 75 Above Average  Story Learning       Immediate Recall 52/80 (47) 38 Average    Delayed Recall 31/40 (54) 66 Average    Retention Percentage 107 (56) 73 Average  Daily Living Memory       Immediate Recall 39/51 (50) 50 Average    Delayed Recall 15/17 (58) 79 Above Average    Retention Percentage 94 (56) 73 Average    Recognition Hits 9/10 (53) 62 Average       Attention/Executive Function:     Trail Making Test (TMT): Raw Score (T Score) Percentile     Part A 38 secs.,  1 error (50) 50 Average    Part B 78 secs.,  1 error (55) 69 Average         Scaled Score Percentile   WAIS-IV Coding: 9 37 Average       NAB Attention Module, Form 1: T Score Percentile     Digits Forward 50 50 Average    Digits Backwards 39 14 Below Average        Scaled Score Percentile   WAIS-IV Similarities: 9 37 Average       NAB Executive Functions Module, Form 1: T Score Percentile     Judgment 77 >99 Exceptionally High       Language:     Verbal Fluency Test: Raw Score (T Score) Percentile     Phonemic Fluency  (FAS) 20 (34) 5 Well Below Average    Animal Fluency 17 (51) 54 Average        NAB Language Module, Form 1: T Score Percentile     Auditory Comprehension 58 79 Above Average    Naming 31/31 (58) 79 Above Average       Visuospatial/Visuoconstruction:      Raw Score Percentile   Clock Drawing: 10/10 --- Within Normal Limits  NAB Spatial Module, Form 1: T Score Percentile     Figure Drawing Copy 57 75 Above Average        Scaled Score Percentile   WAIS-IV Block Design: 11 63 Average       Mood and Personality:      Raw Score Percentile   Geriatric Depression Scale: 8 --- Within Normal Limits  Geriatric Anxiety Scale: 17 --- Mild    Somatic 5 --- Minimal    Cognitive 6 --- Mild    Affective 6 --- Mild       Additional Questionnaires:      Raw Score Percentile   PROMIS Sleep Disturbance Questionnaire: 16 --- None to Slight   Informed Consent and Coding/Compliance:   The current evaluation represents a clinical evaluation for the purposes previously outlined by the referral source and is in no way reflective of a forensic evaluation.   Eric Cortez was provided with a verbal description of the nature and purpose of the present neuropsychological evaluation. Also reviewed were the foreseeable risks and/or discomforts and benefits of the procedure, limits of confidentiality, and mandatory reporting requirements of this provider. The patient was given the opportunity to ask questions and receive answers about the evaluation. Oral consent to participate was provided by the patient.   This evaluation was conducted by Christia Reading, Ph.D., ABPP-CN, board certified clinical neuropsychologist. Eric Cortez completed a clinical interview with Dr. Melvyn Cortez, billed as one unit [phone], and 120 minutes of cognitive testing and scoring, billed as one unit [phone] and three additional units [id number]. Psychometrist Milana Kidney, B.S., assisted Dr. Melvyn Cortez with test administration and scoring procedures. As a  separate and discrete service, Dr. Melvyn Cortez spent a total of 160 minutes in interpretation and report writing billed as one unit [phone] and two units [id number].

## 2022-03-28 NOTE — Progress Notes (Signed)
   Psychometrician Note   Cognitive testing was administered to Eric Cortez by Milana Kidney, B.S. (psychometrist) under the supervision of Dr. Christia Reading, Ph.D., licensed psychologist on 03/28/2022. Mr. Lolli did not appear overtly distressed by the testing session per behavioral observation or responses across self-report questionnaires. Rest breaks were offered.    The battery of tests administered was selected by Dr. Christia Reading, Ph.D. with consideration to Mr. Nelles current level of functioning, the nature of his symptoms, emotional and behavioral responses during interview, level of literacy, observed level of motivation/effort, and the nature of the referral question. This battery was communicated to the psychometrist. Communication between Dr. Christia Reading, Ph.D. and the psychometrist was ongoing throughout the evaluation and Dr. Christia Reading, Ph.D. was immediately accessible at all times. Dr. Christia Reading, Ph.D. provided supervision to the psychometrist on the date of this service to the extent necessary to assure the quality of all services provided.    Eric Cortez will return within approximately 1-2 weeks for an interactive feedback session with Dr. Melvyn Novas at which time his test performances, clinical impressions, and treatment recommendations will be reviewed in detail. Mr. Holsworth understands he can contact our office should he require our assistance before this time.  A total of 120 minutes of billable time were spent face-to-face with Mr. Opdahl by the psychometrist. This includes both test administration and scoring time. Billing for these services is reflected in the clinical report generated by Dr. Christia Reading, Ph.D.  This note reflects time spent with the psychometrician and does not include test scores or any clinical interpretations made by Dr. Melvyn Novas. The full report will follow in a separate note.

## 2022-03-30 ENCOUNTER — Other Ambulatory Visit: Payer: Self-pay | Admitting: Interventional Cardiology

## 2022-04-04 ENCOUNTER — Ambulatory Visit (INDEPENDENT_AMBULATORY_CARE_PROVIDER_SITE_OTHER): Payer: Medicare PPO | Admitting: Psychology

## 2022-04-04 DIAGNOSIS — R4189 Other symptoms and signs involving cognitive functions and awareness: Secondary | ICD-10-CM

## 2022-04-04 DIAGNOSIS — I6781 Acute cerebrovascular insufficiency: Secondary | ICD-10-CM | POA: Diagnosis not present

## 2022-04-04 NOTE — Progress Notes (Signed)
   Neuropsychology Feedback Session Tillie Rung. [facility] Department of Neurology  Reason for Referral:   Eric Cortez is a 71 y.o. right-handed Caucasian male referred by  Sharene Butters, PA-C , to characterize his current cognitive functioning and assist with diagnostic clarity and treatment planning in the context of subjective cognitive decline.   Feedback:   Eric Cortez completed a comprehensive neuropsychological evaluation on 03/28/2022. Please refer to that encounter for the full report and recommendations. Briefly, results suggested an isolated impairment surrounding phonemic fluency. However, all other aspects of expressive language were appropriate. Performances were also appropriate relative to age-matched peers across all other assessed cognitive domains. 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 Subjective day-to-day dysfunction may also be influenced by uncorrected hearing loss, ongoing anxiety, and, as reported by his wife, the fact that they have been under a "tremendous amount of stress" lately. This could certainly increase the risk for inefficient learning of new information and the experience of attentional and memory lapses, especially with heightened stress or when trying to multi-task.  Eric Cortez was accompanied by his wife during the current feedback session. Content of the current session focused on the results of his neuropsychological evaluation. Eric Cortez was given the opportunity to ask questions and his questions were answered. He was encouraged to reach out should additional questions arise. A copy of his report was provided at the conclusion of the visit.      25 minutes were spent conducting the current feedback session with Eric Cortez, billed as one unit [phone].

## 2022-04-10 ENCOUNTER — Ambulatory Visit: Payer: Medicare PPO | Admitting: Physician Assistant

## 2022-04-15 NOTE — Progress Notes (Signed)
History of Present Illness:   12.08.2020: His presenting PSA, checked on 6.17.2014 (prior to his first visit) was 5.2. By history is PSA 2-3 years prior was 1.8, and his PSA in 2013 was 2.5.  His PSA was rechecked in August, 2014 and was 3.78. It was rechecked in November/2014 and was stable at 3.84. He was felt to have a moderately enlarged prostate, judged to be about 60 g by DRE, at the time of his first visit.   11.3.2020: PSA 7.4   12.8.2020: TRUS/Bx. prostate volume 52 mL.  1/12 cores--right apex medial--revealed small focus of atypia.   PSA:    10.09.2018: 4.6 10.15.2019: 4.8 11.03.2020: 7.4 8.31.2021: 3.5 8.30.2022: 4.2.   8.15.2023: IPSS 14  QoL score 3. On alfuzosin. No c/o ED  Past Medical History:  Diagnosis Date   Acquired scoliosis    Age-related nuclear cataract, bilateral    Allergic reaction to alpha-gal 12/31/2021   Allergy to meat 09/12/2015   Arthritis, degenerative 07/06/2002   Asthma    Atypical mole 2014   moderate left chest   CAD (coronary artery disease)    a. 01/2014 Inf STEMI/PCI: LM nl, LAD 50p/m, LCX 30-70m OM1 50, OM2/3/4 nl, RCA (anomalous) 100p (3.5x23 Xience Alpine DES), EF 60;    Chest pain 10/19/2014   Chronic idiopathic urticaria 12/31/2021   Dry eye syndrome of bilateral lacrimal glands    Elevated PSA    Family history of malignant neoplasm of gastrointestinal tract 07/06/2002   Foot-drop    Hyperlipidemia, unspecified 07/06/2002   IMO Load 2018 R1.1   Hypertension    Ischemic cardiomyopathy    ab. 01/2014 Echo: EF 45-50%, inferoseptal/inf sev HK, Gr 1 DD, nl valves.   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   Pain in finger of left hand 08/29/2016   Perennial allergic rhinitis 12/31/2021   Presence of stent in right coronary artery 08/08/2016   Sensorineural hearing loss, bilateral    Skin lesion    Spinal stenosis of lumbar region    Spondylolisthesis, lumbar region 06/25/2021   Tremor      Past Surgical History:  Procedure Laterality Date   CORONARY STENT PLACEMENT  02/25/2014   LEFT HEART CATHETERIZATION WITH CORONARY ANGIOGRAM N/A 02/25/2014   Procedure: LEFT HEART CATHETERIZATION WITH CORONARY ANGIOGRAM;  Surgeon: HSinclair Grooms MD;  [facility];  Service: Cardiovascular;  Laterality: N/A;   PERCUTANEOUS CORONARY STENT INTERVENTION (PCI-S)  02/25/2014   Procedure: PERCUTANEOUS CORONARY STENT INTERVENTION (PCI-S);  Surgeon: HSinclair Grooms MD;  [facility];  Service: Cardiovascular;;   TONSILLECTOMY      Home Medications:  Allergies as of 04/16/2022   No Known Allergies      Medication List        Accurate as of April 15, 2022 11:13 AM. If you have any questions, ask your nurse or doctor.          albuterol (2.5 MG/3ML) 0.083% nebulizer solution Commonly known as: PROVENTIL Take 3 mLs (2.5 mg total) by nebulization every 4 (four) hours as needed for wheezing or shortness of breath. Use one vial in nebulizer every four to six hours as needed for cough or wheeze.   albuterol 108 (90 Base) MCG/ACT inhaler Commonly known as: VENTOLIN HFA INHALE 2 PUFFS EVERY 6 HOURS AS NEEDED FOR WHEEZING OR SHORTNESS OF BREATH   alfuzosin 10 MG 24 hr tablet Commonly known as: UROXATRAL Take 1 tablet (10 mg total) by  mouth daily.   aspirin EC 81 MG tablet Take 1 tablet (81 mg total) by mouth daily. Please make yearly appt with Dr. Tamala Julian for April 2023 for future refills. Thank you 1st attempt   atorvastatin 20 MG tablet Commonly known as: LIPITOR TAKE 1 TABLET EVERY DAY (NEED MD APPOINTMENT FOR REFILLS)   azelastine 0.1 % nasal spray Commonly known as: ASTELIN Place 1 spray into both nostrils 2 (two) times daily. Use in each nostril as directed   budesonide 180 MCG/ACT inhaler Commonly known as: PULMICORT Inhale 2 puffs into the lungs 2 (two) times daily as needed (shortness of breath). Rinse, gargle, and spit after use.   buPROPion  150 MG 24 hr tablet Commonly known as: WELLBUTRIN XL Take 1 tablet by mouth daily.   CoQ-10 150 MG Caps Take 150 mg by mouth daily.   cyclobenzaprine 5 MG tablet Commonly known as: FLEXERIL Take by mouth.   EPINEPHrine 0.3 mg/0.3 mL Soaj injection Commonly known as: EPI-PEN Use for life-threatening allergic reactions   Flovent HFA 110 MCG/ACT inhaler Generic drug: fluticasone Inhale 2 puffs into the lungs in the morning and at bedtime.   fluticasone 50 MCG/ACT nasal spray Commonly known as: FLONASE USE 1 TO 2 SPRAYS IN EACH NOSTRIL EVERY DAY AS NEEDED   melatonin 5 MG Tabs Take 5 mg by mouth at bedtime as needed (sleep).   memantine 5 MG tablet Commonly known as: NAMENDA Take 1 tablet (5 mg total) by mouth daily.   nitroGLYCERIN 0.4 MG SL tablet Commonly known as: NITROSTAT Place 1 tablet (0.4 mg total) under the tongue every 5 (five) minutes x 3 doses as needed for chest pain.   omalizumab 150 MG/ML prefilled syringe Commonly known as: Xolair Inject 300 mg into the skin every 28 (twenty-eight) days.        Allergies: No Known Allergies  Family History  Problem Relation Age of Onset   Colon cancer Mother    Memory loss Mother    Heart attack Father    Hypertension Father    Heart disease Father    Diabetes Father    Diverticulosis Sister    Stroke Paternal Grandmother    Stroke Paternal Grandfather    Heart disease Maternal Aunt    Schizophrenia Maternal Aunt     Social History:  reports that he has never smoked. He has never used smokeless tobacco. He reports current alcohol use of about 3.0 standard drinks of alcohol per week. He reports that he does not use drugs.  ROS: A complete review of systems was performed.  All systems are negative except for pertinent findings as noted.  Physical Exam:  Vital signs in last 24 hours: There were no vitals taken for this visit. Constitutional:  Alert and oriented, No acute distress Cardiovascular: Regular  rate  Respiratory: Normal respiratory effort  Genitourinary: Normal anal sphincter tone.  Prostate 60 g, symmetric, nonnodular, nontender. Lymphatic: No lymphadenopathy Neurologic: Grossly intact, no focal deficits Psychiatric: Normal mood and affect  I have reviewed prior pt notes  I have reviewed urinalysis results  I have independently reviewed prior imaging--prior ultrasound  I have reviewed prior PSA results  I have reviewed IPSS score   Impression/Assessment:  1.  BPH, symptoms acceptable on alfuzosin.  DRE stable  2.  Elevated PSA with small focus of atypia seen on biopsy 2020.  PSA, DRE and stable  Plan:  1.  He will continue on alfuzosin  2.  His PSA is checked today  3.  I will see him back in 1 year for follow-up

## 2022-04-16 ENCOUNTER — Encounter: Payer: Self-pay | Admitting: Urology

## 2022-04-16 ENCOUNTER — Ambulatory Visit (INDEPENDENT_AMBULATORY_CARE_PROVIDER_SITE_OTHER): Payer: Medicare PPO | Admitting: Urology

## 2022-04-16 VITALS — BP 146/88 | HR 58 | Ht 69.0 in | Wt 165.0 lb

## 2022-04-16 DIAGNOSIS — R972 Elevated prostate specific antigen [PSA]: Secondary | ICD-10-CM

## 2022-04-16 DIAGNOSIS — N400 Enlarged prostate without lower urinary tract symptoms: Secondary | ICD-10-CM

## 2022-04-17 LAB — PSA: Prostate Specific Ag, Serum: 4.7 ng/mL — ABNORMAL HIGH (ref 0.0–4.0)

## 2022-04-18 LAB — URINALYSIS, ROUTINE W REFLEX MICROSCOPIC
Bilirubin, UA: NEGATIVE
Glucose, UA: NEGATIVE
Ketones, UA: NEGATIVE
Leukocytes,UA: NEGATIVE
Nitrite, UA: NEGATIVE
Protein,UA: NEGATIVE
RBC, UA: NEGATIVE
Specific Gravity, UA: <1.005 — ABNORMAL LOW (ref 1.005–1.030)
Urobilinogen, Ur: 0.2 mg/dL (ref 0.2–1.0)
pH, UA: 5.5 (ref 5.0–7.5)

## 2022-04-19 ENCOUNTER — Ambulatory Visit (INDEPENDENT_AMBULATORY_CARE_PROVIDER_SITE_OTHER): Payer: Medicare PPO | Admitting: Podiatry

## 2022-04-19 ENCOUNTER — Encounter: Payer: Self-pay | Admitting: Podiatry

## 2022-04-19 DIAGNOSIS — M7752 Other enthesopathy of left foot: Secondary | ICD-10-CM

## 2022-04-19 MED ORDER — TRIAMCINOLONE ACETONIDE 10 MG/ML IJ SUSP
10.0000 mg | Freq: Once | INTRAMUSCULAR | Status: AC
  Administered 2022-04-19: 10 mg

## 2022-04-21 NOTE — Progress Notes (Signed)
Subjective:   Patient ID: Eric Cortez, male   DOB: 71 y.o.   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

## 2022-04-22 ENCOUNTER — Other Ambulatory Visit: Payer: Self-pay | Admitting: Interventional Cardiology

## 2022-04-23 NOTE — Progress Notes (Signed)
Cardiology Office Note    Date:  05/07/2022   ID:  Eric Cortez, DOB Nov 12, 1950, MRN [id number]   PCP:  Curlene Labrum, MD   [place]  Cardiologist:  Sinclair Grooms, MD   Advanced Practice Provider:  No care team member to display Electrophysiologist:  None   [phone]   Chief Complaint  Patient presents with   Follow-up    History of Present Illness:  Eric Cortez is a 71 y.o. male with history of CAD status post inferior STEMI treated with DES to the RCA, ischemic cardiomyopathy EF 45 to 50% 2015, hypertension.   I last saw the patient 12/2020 and he was doing well exercising daily despite back problems.  Patient comes in for f/u. He had back surgery since here last and doing so much better. Denies chest pain, dyspnea, palpitations, dizziness. Plays golf, works around the house. 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 Just finished building his home.   Past Medical History:  Diagnosis Date   Acquired scoliosis    Age-related nuclear cataract, bilateral    Allergic reaction to alpha-gal 12/31/2021   Allergy to meat 09/12/2015   Arthritis, degenerative 07/06/2002   Asthma    Atypical mole 2014   moderate left chest   CAD (coronary artery disease)    a. 01/2014 Inf STEMI/PCI: LM nl, LAD 50p/m, LCX 30-56m OM1 50, OM2/3/4 nl, RCA (anomalous) 100p (3.5x23 Xience Alpine DES), EF 60;    Chest pain 10/19/2014   Chronic idiopathic urticaria 12/31/2021   Dry eye syndrome of bilateral lacrimal glands    Elevated PSA    Family history of malignant neoplasm of gastrointestinal tract 07/06/2002   Foot-drop    Hyperlipidemia, unspecified 07/06/2002   IMO Load 2018 R1.1   Hypertension    Ischemic cardiomyopathy    ab. 01/2014 Echo: EF 45-50%, inferoseptal/inf sev HK, Gr 1 DD, nl valves.   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   Pain in finger of left hand 08/29/2016   Perennial  allergic rhinitis 12/31/2021   Presence of stent in right coronary artery 08/08/2016   Sensorineural hearing loss, bilateral    Skin lesion    Spinal stenosis of lumbar region    Spondylolisthesis, lumbar region 06/25/2021   Tremor     Past Surgical History:  Procedure Laterality Date   CORONARY STENT PLACEMENT  02/25/2014   LEFT HEART CATHETERIZATION WITH CORONARY ANGIOGRAM N/A 02/25/2014   Procedure: LEFT HEART CATHETERIZATION WITH CORONARY ANGIOGRAM;  Surgeon: HSinclair Grooms MD;  [facility];  Service: Cardiovascular;  Laterality: N/A;   PERCUTANEOUS CORONARY STENT INTERVENTION (PCI-S)  02/25/2014   Procedure: PERCUTANEOUS CORONARY STENT INTERVENTION (PCI-S);  Surgeon: HSinclair Grooms MD;  [facility];  Service: Cardiovascular;;   TONSILLECTOMY      Current Medications: Current Meds  Medication Sig   albuterol (PROVENTIL) (2.5 MG/3ML) 0.083% nebulizer solution Take 3 mLs (2.5 mg total) by nebulization every 4 (four) hours as needed for wheezing or shortness of breath. Use one vial in nebulizer every four to six hours as needed for cough or wheeze.   albuterol (VENTOLIN HFA) 108 (90 Base) MCG/ACT inhaler INHALE 2 PUFFS EVERY 6 HOURS AS NEEDED FOR WHEEZING OR SHORTNESS OF BREATH   alfuzosin (UROXATRAL) 10 MG 24 hr tablet Take 1 tablet (10 mg total) by mouth daily.   aspirin  EC 81 MG tablet Take 1 tablet (81 mg total) by mouth daily. Please make yearly appt with Dr. Tamala Julian for April 2023 for future refills. Thank you 1st attempt   azelastine (ASTELIN) 0.1 % nasal spray Place 1 spray into both nostrils 2 (two) times daily. Use in each nostril as directed   budesonide (PULMICORT) 180 MCG/ACT inhaler Inhale 2 puffs into the lungs 2 (two) times daily as needed (shortness of breath). Rinse, gargle, and spit after use.   buPROPion (WELLBUTRIN XL) 150 MG 24 hr tablet Take 1 tablet by mouth daily.   Coenzyme Q10 (COQ-10) 150 MG CAPS Take 150 mg by mouth daily.    cyclobenzaprine (FLEXERIL) 5 MG tablet Take by mouth.   EPINEPHrine 0.3 mg/0.3 mL IJ SOAJ injection Use for life-threatening allergic reactions   FLOVENT HFA 110 MCG/ACT inhaler Inhale 2 puffs into the lungs in the morning and at bedtime.   fluticasone (FLONASE) 50 MCG/ACT nasal spray USE 1 TO 2 SPRAYS IN EACH NOSTRIL EVERY DAY AS NEEDED   melatonin 5 MG TABS Take 5 mg by mouth at bedtime as needed (sleep).   memantine (NAMENDA) 5 MG tablet Take 1 tablet (5 mg total) by mouth daily.   omalizumab Arvid Right) 150 MG/ML prefilled syringe Inject 300 mg into the skin every 28 (twenty-eight) days.   [DISCONTINUED] atorvastatin (LIPITOR) 20 MG tablet Take 1 tablet (20 mg total) by mouth daily.   [DISCONTINUED] nitroGLYCERIN (NITROSTAT) 0.4 MG SL tablet Place 1 tablet (0.4 mg total) under the tongue every 5 (five) minutes x 3 doses as needed for chest pain.   Current Facility-Administered Medications for the 05/07/22 encounter (Office Visit) with Imogene Burn, PA-C  Medication   omalizumab Arvid Right) injection 300 mg     Allergies:   Patient has no known allergies.   Social History   Socioeconomic History   Marital status: Married    Spouse name: Not on file   Number of children: Not on file   Years of education: 12   Highest education level: High school graduate  Occupational History   Occupation: Retired    Comment: Dealer  Tobacco Use   Smoking status: Never   Smokeless tobacco: Never  Vaping Use   Vaping Use: Never used  Substance and Sexual Activity   Alcohol use: Yes    Alcohol/week: 3.0 standard drinks of alcohol    Types: 3 Glasses of wine per week    Comment: occassional ETOH   Drug use: No   Sexual activity: Not on file  Other Topics Concern   Not on file  Social History Narrative   Right Handed    Drinks a few cups of coffee in the morning    Social Determinants of Health   Financial Resource Strain: Not on file  Food Insecurity: Not on file  Transportation Needs:  Not on file  Physical Activity: Not on file  Stress: Not on file  Social Connections: Not on file     Family History:  The patient's  family history includes Colon cancer in his mother; Diabetes in his father; Diverticulosis in his sister; Heart attack in his father; Heart disease in his father and maternal aunt; Hypertension in his father; Memory loss in his mother; Schizophrenia in his maternal aunt; Stroke in his paternal grandfather and paternal grandmother.   ROS:   Please see the history of present illness.    ROS All other systems reviewed and are negative.   PHYSICAL EXAM:   VS:  BP 138/78   Pulse 66   Ht '5\' 9"'$  (1.753 m)   Wt 168 lb (76.2 kg)   SpO2 94%   BMI 24.81 kg/m   Physical Exam  GEN: Thin, in no acute distress  Neck: no JVD, carotid bruits, or masses Cardiac:RRR; no murmurs, rubs, or gallops  Respiratory:  clear to auscultation bilaterally, normal work of breathing GI: soft, nontender, nondistended, + BS Ext: without cyanosis, clubbing, or edema, Good distal pulses bilaterally Neuro:  Alert and Oriented x 3,  Psych: euthymic mood, full affect  Wt Readings from Last 3 Encounters:  05/07/22 168 lb (76.2 kg)  04/16/22 165 lb (74.8 kg)  03/13/22 160 lb (72.6 kg)      Studies/Labs Reviewed:   EKG:  EKG is  ordered today.  The ekg ordered today demonstrates NSR with inf Q waves   Recent Labs: 06/26/2021: BUN 16; Creatinine, Ser 1.24; Hemoglobin 14.2; Platelets 197; Potassium 4.5; Sodium 138   Lipid Panel    Component Value Date/Time   CHOL 120 09/05/2014 1029   TRIG 80.0 09/05/2014 1029   HDL 32.70 (L) 09/05/2014 1029   CHOLHDL 4 09/05/2014 1029   VLDL 16.0 09/05/2014 1029   LDLCALC 71 09/05/2014 1029    Additional studies/ records that were reviewed today include:  2D echo 02/25/2014 Study Conclusions   - Left ventricle: The cavity size was normal. Wall thickness was    normal. Systolic function was mildly reduced. The estimated    ejection  fraction was in the range of 45% to 50%. Inferoseptal    and inferior severe hypokinesis. Doppler parameters are    consistent with abnormal left ventricular relaxation (grade 1    diastolic dysfunction).  - Aortic valve: There was no stenosis.  - Mitral valve: There was no significant regurgitation.  - Right ventricle: The cavity size was normal. Systolic function    was normal.  - Pulmonary arteries: No complete TR doppler jet so unable to    estimate PA systolic pressure.  - Inferior vena cava: The vessel was normal in size. The    respirophasic diameter changes were in the normal range (= 50%),    consistent with normal central venous pressure.   Impressions:   - Normal LV size with mildly decreased systolic function, EF    15-40%. Severe hypokinesis of the inferoseptal and inferior    walls. Normal RV size and systoilc function. No significant    valvular abnormalities.      Risk Assessment/Calculations:         ASSESSMENT:    1. Coronary artery disease involving native coronary artery of native heart without angina pectoris   2. Ischemic cardiomyopathy   3. Essential hypertension   4. Hyperlipidemia, unspecified hyperlipidemia type      PLAN:  In order of problems listed above:  CAD status post inferior STEMI in 2015 treated with DES to the RCA, low risk NST 2016-no angina. Continue ASA 81 mg daily  Ischemic cardiomyopathy ejection fraction 45 to 50% in 2015. Suspect it has normalized as he has no symptoms and is euvolemic.  Hypertension BP well controlled-not on any meds.  HLD-LDL 75 in Feb at [place] per patient on lipitor  Shared Decision Making/Informed Consent        Medication Adjustments/Labs and Tests Ordered: Current medicines are reviewed at length with the patient today.  Concerns regarding medicines are outlined above.  Medication changes, Labs and Tests ordered today are listed in the Patient Instructions below. Patient  Instructions  Medication  Instructions:  Your physician recommends that you continue on your current medications as directed. Please refer to the Current Medication list given to you today.  *If you need a refill on your cardiac medications before your next appointment, please call your pharmacy*   Lab Work: None If you have labs (blood work) drawn today and your tests are completely normal, you will receive your results only by: [place] (if you have MyChart) OR A paper copy in the mail If you have any lab test that is abnormal or we need to change your treatment, we will call you to review the results.   Follow-Up: At [facility], you and your health needs are our priority.  As part of our continuing mission to provide you with exceptional heart care, we have created designated Provider Care Teams.  These Care Teams include your primary Cardiologist (physician) and Advanced Practice Providers (APPs -  Physician Assistants and Nurse Practitioners) who all work together to provide you with the care you need, when you need it.   Your next appointment:   1 year(s)  The format for your next appointment:   In Person  Provider:   Rudean Haskell, MD      Signed, Ermalinda Barrios, PA-C  05/07/2022 9:46 AM    [address], [place], [place]  [address] Phone: [phone]; Fax: [phone]

## 2022-04-24 ENCOUNTER — Telehealth: Payer: Self-pay | Admitting: *Deleted

## 2022-04-24 NOTE — Telephone Encounter (Signed)
Eric Cortez trying to reach patient for annual reverification needs new consent form. Unable to get response from patient

## 2022-04-30 ENCOUNTER — Ambulatory Visit: Payer: Medicare PPO | Admitting: Urology

## 2022-05-07 ENCOUNTER — Ambulatory Visit: Payer: Medicare PPO | Attending: Physician Assistant | Admitting: Physician Assistant

## 2022-05-07 ENCOUNTER — Encounter: Payer: Self-pay | Admitting: Physician Assistant

## 2022-05-07 VITALS — BP 138/78 | HR 66 | Ht 69.0 in | Wt 168.0 lb

## 2022-05-07 DIAGNOSIS — E785 Hyperlipidemia, unspecified: Secondary | ICD-10-CM

## 2022-05-07 DIAGNOSIS — I255 Ischemic cardiomyopathy: Secondary | ICD-10-CM | POA: Diagnosis not present

## 2022-05-07 DIAGNOSIS — I2510 Atherosclerotic heart disease of native coronary artery without angina pectoris: Secondary | ICD-10-CM

## 2022-05-07 DIAGNOSIS — I10 Essential (primary) hypertension: Secondary | ICD-10-CM | POA: Diagnosis not present

## 2022-05-07 MED ORDER — ATORVASTATIN CALCIUM 20 MG PO TABS: 20.0000 mg | ORAL_TABLET | Freq: Every day | ORAL | 3 refills | Status: DC

## 2022-05-07 MED ORDER — NITROGLYCERIN 0.4 MG SL SUBL: 0.4000 mg | SUBLINGUAL_TABLET | SUBLINGUAL | 3 refills | Status: DC | PRN

## 2022-05-07 NOTE — Patient Instructions (Addendum)
Medication Instructions:  Your physician recommends that you continue on your current medications as directed. Please refer to the Current Medication list given to you today.  *If you need a refill on your cardiac medications before your next appointment, please call your pharmacy*   Lab Work: None If you have labs (blood work) drawn today and your tests are completely normal, you will receive your results only by: [place] (if you have MyChart) OR A paper copy in the mail If you have any lab test that is abnormal or we need to change your treatment, we will call you to review the results.   Follow-Up: At [facility], you and your health needs are our priority.  As part of our continuing mission to provide you with exceptional heart care, we have created designated Provider Care Teams.  These Care Teams include your primary Cardiologist (physician) and Advanced Practice Providers (APPs -  Physician Assistants and Nurse Practitioners) who all work together to provide you with the care you need, when you need it.   Your next appointment:   1 year(s)  The format for your next appointment:   In Person  Provider:   Rudean Haskell, MD

## 2022-05-08 ENCOUNTER — Telehealth: Payer: Self-pay

## 2022-05-08 NOTE — Addendum Note (Signed)
Addended by: Pricilla Handler R on: 05/08/2022 12:00 PM   Modules accepted: Orders

## 2022-05-08 NOTE — Telephone Encounter (Signed)
Tried to call patient to inform him of results.  No answer, left a detailed message informing patient.   Franchot Gallo, MD  [place], [place], LPN; Dorisann Frames, RN; Audie Box, [place] Patient has not reviewed his results on MyChart.  Let him know that PSA is 4.7, stable for him

## 2022-05-09 NOTE — Addendum Note (Signed)
Addended by: Wadie Lessen on: 05/09/2022 02:35 PM   Modules accepted: Orders

## 2022-06-14 ENCOUNTER — Other Ambulatory Visit: Payer: Self-pay | Admitting: Interventional Cardiology

## 2022-06-14 ENCOUNTER — Other Ambulatory Visit: Payer: Self-pay | Admitting: Allergy and Immunology

## 2022-06-21 ENCOUNTER — Encounter: Payer: Medicare PPO | Admitting: Psychology

## 2022-06-22 ENCOUNTER — Other Ambulatory Visit: Payer: Self-pay | Admitting: Interventional Cardiology

## 2022-06-26 ENCOUNTER — Ambulatory Visit: Payer: Medicare PPO

## 2022-07-01 ENCOUNTER — Encounter: Payer: Medicare PPO | Admitting: Psychology

## 2022-07-05 ENCOUNTER — Ambulatory Visit: Payer: Medicare PPO | Admitting: Allergy & Immunology

## 2022-07-12 IMAGING — MR MR HEAD W/O CM
10 series · 48 of 48 positions shown · non-contrast
Comparison: None.

CLINICAL DATA: Provided history: Memory loss. Additional history
provided by scanning technologist: Short-term memory loss for 6
months, hand tremors.

EXAM:
MRI HEAD WITHOUT CONTRAST
TECHNIQUE: Multiplanar, multiecho pulse sequences of the brain and surrounding
structures were obtained without intravenous contrast.

[Series 2: T1 · sagittal · 5.0mm · 0.45mm/px · 3 of 21 slices shown]
[im 1/21]
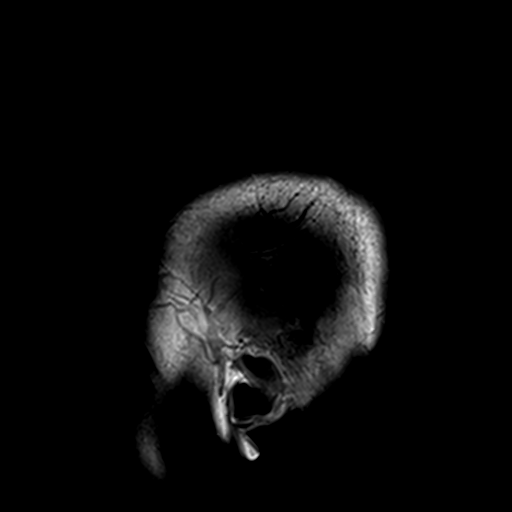
[im 11/21]
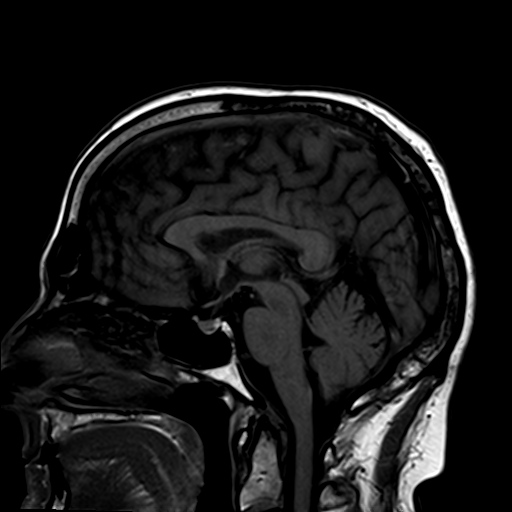
[im 21/21]
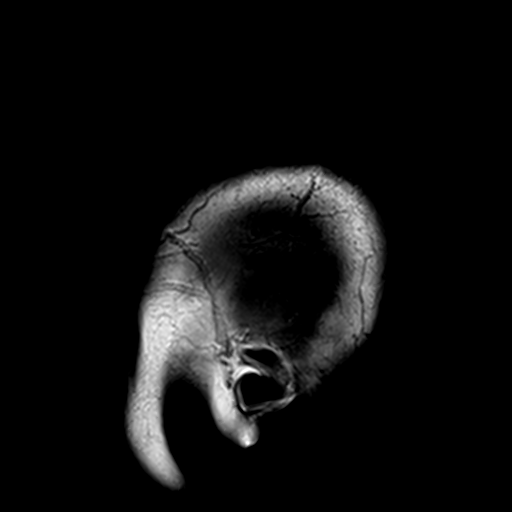

[Series 3: DWI · axial · 3.0mm · 1.80mm/px · z∈[-78,+68]mm · 9 of 100 slices shown (1 of 4)]
[im 1/100]
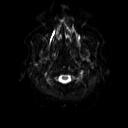
[im 13/100]
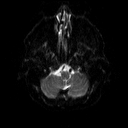
[im 25/100]
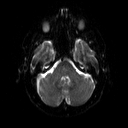
[im 38/100]
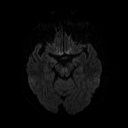
[im 50/100]
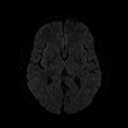
[im 62/100]
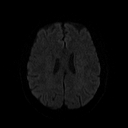
[im 75/100]
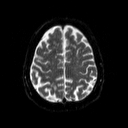
[im 87/100]
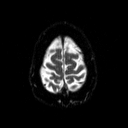
[im 100/100]
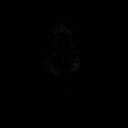

[Series 4: DWI · axial · 3.0mm · 1.80mm/px · z∈[-78,+68]mm · 4 of 48 slices shown (2 of 4)]
[im 1/48]
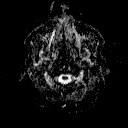
[im 16/48]
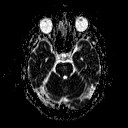
[im 32/48]
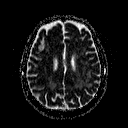
[im 48/48]
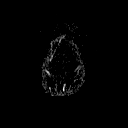

[Series 5: DWI · coronal · 5.0mm · 1.80mm/px · 6 of 68 slices shown (3 of 4)]
[im 1/68]
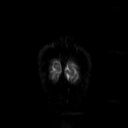
[im 14/68]
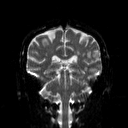
[im 27/68]
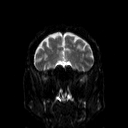
[im 41/68]
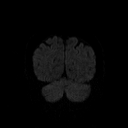
[im 54/68]
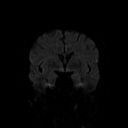
[im 68/68]
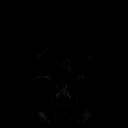

[Series 6: DWI · coronal · 5.0mm · 1.80mm/px · 3 of 34 slices shown (4 of 4)]
[im 1/34]
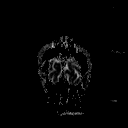
[im 17/34]
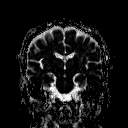
[im 34/34]
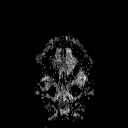

[Series 7: T2 · axial · 5.0mm · 0.51mm/px · z∈[-79,+67]mm · 2 of 22 slices shown (1 of 2)]
[im 1/22]
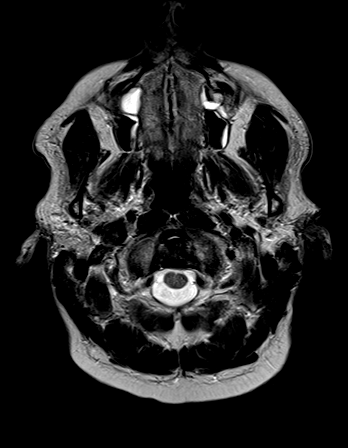
[im 22/22]
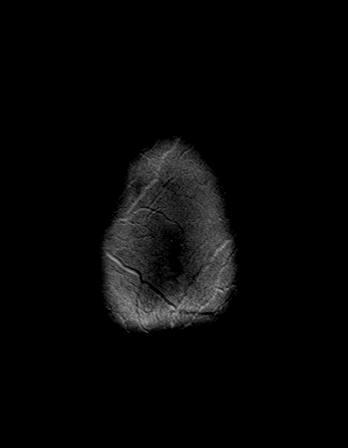

[Series 8: FLAIR · axial · 3.0mm · 0.45mm/px · z∈[-79,+69]mm · 3 of 33 slices shown]
[im 1/33]
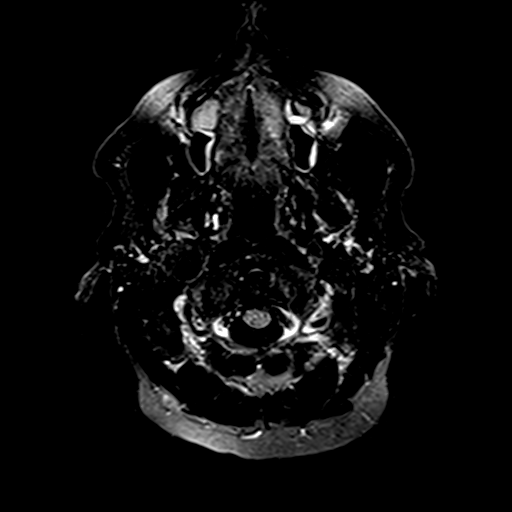
[im 17/33]
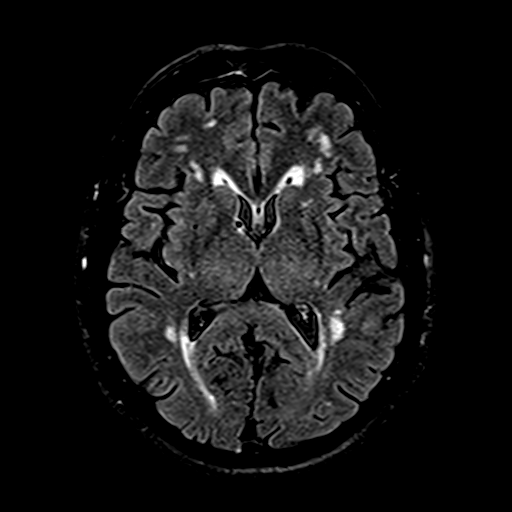
[im 33/33]
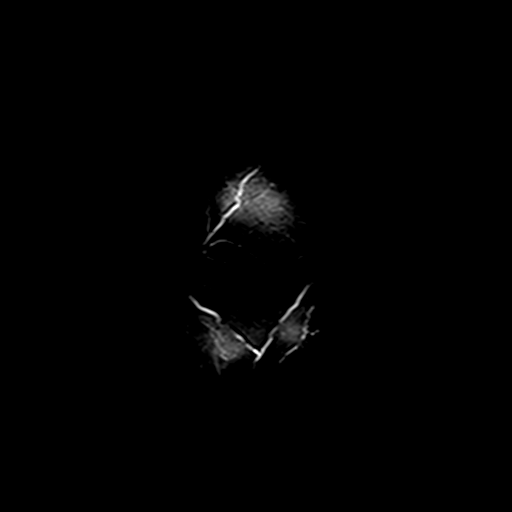

[Series 10: swi_images · axial · 4.0mm · 0.90mm/px · z∈[-75,+64]mm · 3 of 36 slices shown]
[im 1/36]
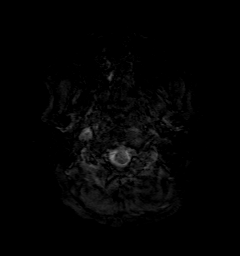
[im 18/36]
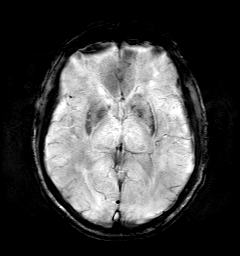
[im 36/36]
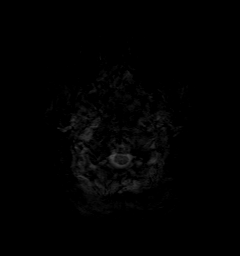

[Series 11: t1_mpr_tra · axial · 1.0mm · 0.71mm/px · z∈[-77,+65]mm · 13 of 144 slices shown]
[im 1/144]
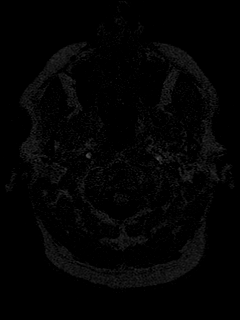
[im 12/144]
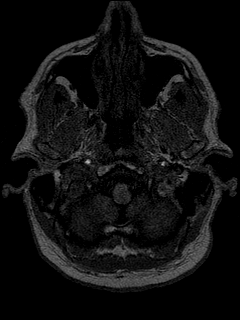
[im 24/144]
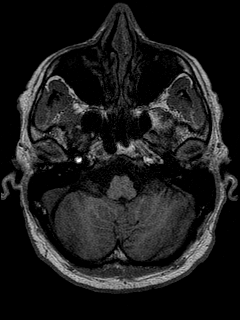
[im 36/144]
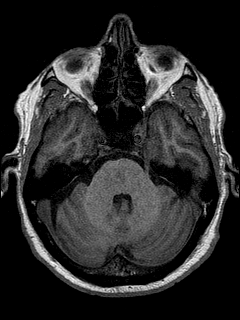
[im 48/144]
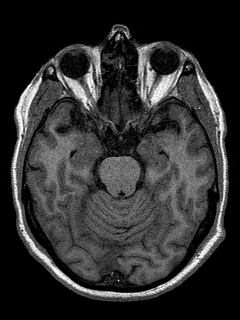
[im 60/144]
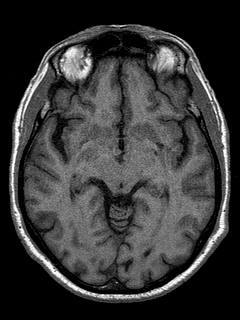
[im 72/144]
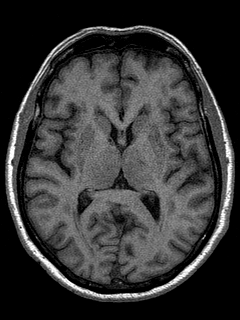
[im 84/144]
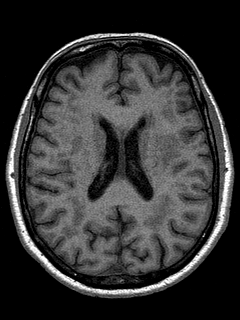
[im 96/144]
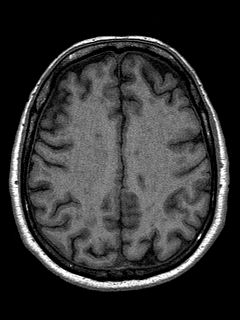
[im 108/144]
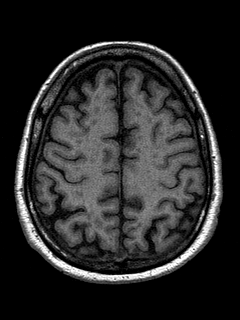
[im 120/144]
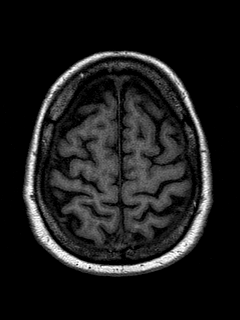
[im 132/144]
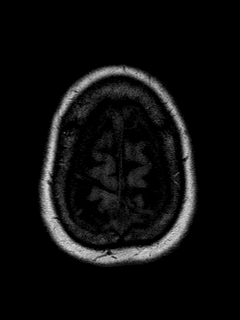
[im 144/144]
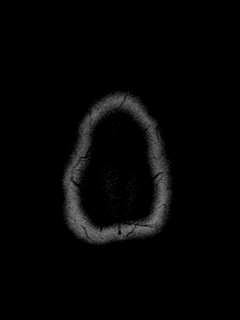

[Series 12: T2 · coronal · 5.0mm · 0.45mm/px · 2 of 25 slices shown (2 of 2)]
[im 1/25]
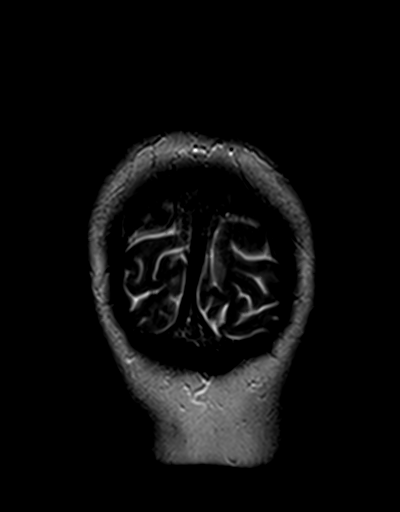
[im 25/25]
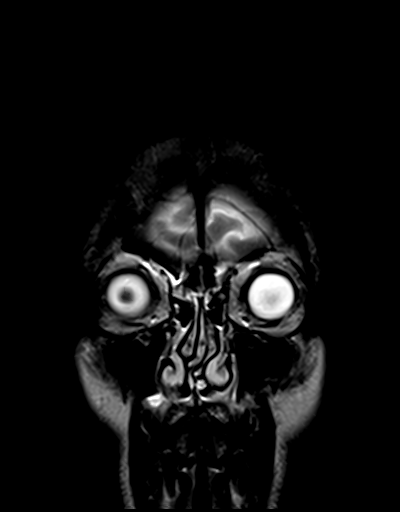

[48 of 48 positions shown; findings below may reference images not displayed]

FINDINGS: Brain:

No age advanced or lobar predominant parenchymal atrophy.

Moderate to advanced multifocal T2 FLAIR hyperintense signal
abnormality within the cerebral white matter, nonspecific but
compatible with chronic small vessel ischemic disease. Mild chronic
small vessel ischemic changes are also present within the pons and
right middle cerebellar peduncle.

There is no acute infarct.

No evidence of an intracranial mass.

No chronic intracranial blood products.

No extra-axial fluid collection.

No midline shift.

Vascular: Maintained flow voids within the proximal large arterial
vessels.

Skull and upper cervical spine: No focal suspicious marrow lesion.

Sinuses/Orbits: Visualized orbits show no acute finding. Mild
mucosal thickening within the bilateral ethmoid, sphenoid and
maxillary sinuses. Superimposed small mucous retention cyst within
the right maxillary sinus.
IMPRESSION: No evidence of acute intracranial abnormality.

Moderate-to-advanced chronic small vessel ischemic changes within
the cerebral white matter. Mild chronic small vessel ischemic
changes are also present within the pons and right middle cerebellar
peduncle.

No age advanced or lobar predominant parenchymal atrophy.

Paranasal sinus disease, as described.

## 2022-07-19 ENCOUNTER — Ambulatory Visit: Payer: Medicare PPO | Admitting: Family Medicine

## 2022-07-19 ENCOUNTER — Other Ambulatory Visit: Payer: Self-pay | Admitting: Family Medicine

## 2022-07-22 ENCOUNTER — Ambulatory Visit (INDEPENDENT_AMBULATORY_CARE_PROVIDER_SITE_OTHER): Payer: Medicare PPO | Admitting: Internal Medicine

## 2022-07-22 ENCOUNTER — Encounter: Payer: Self-pay | Admitting: Internal Medicine

## 2022-07-22 VITALS — BP 138/80 | HR 73 | Temp 97.9°F | Resp 20 | Ht 68.0 in | Wt 174.0 lb

## 2022-07-22 DIAGNOSIS — J3089 Other allergic rhinitis: Secondary | ICD-10-CM | POA: Diagnosis not present

## 2022-07-22 DIAGNOSIS — T781XXD Other adverse food reactions, not elsewhere classified, subsequent encounter: Secondary | ICD-10-CM | POA: Diagnosis not present

## 2022-07-22 DIAGNOSIS — L501 Idiopathic urticaria: Secondary | ICD-10-CM

## 2022-07-22 DIAGNOSIS — J4520 Mild intermittent asthma, uncomplicated: Secondary | ICD-10-CM | POA: Diagnosis not present

## 2022-07-22 DIAGNOSIS — T781XXA Other adverse food reactions, not elsewhere classified, initial encounter: Secondary | ICD-10-CM

## 2022-07-22 MED ORDER — EPINEPHRINE 0.3 MG/0.3ML IJ SOAJ: INTRAMUSCULAR | 1 refills | Status: AC

## 2022-07-22 MED ORDER — ALBUTEROL SULFATE (2.5 MG/3ML) 0.083% IN NEBU: 2.5000 mg | INHALATION_SOLUTION | Freq: Four times a day (QID) | RESPIRATORY_TRACT | 1 refills | Status: DC | PRN

## 2022-07-22 MED ORDER — ALBUTEROL SULFATE HFA 108 (90 BASE) MCG/ACT IN AERS: 2.0000 | INHALATION_SPRAY | Freq: Four times a day (QID) | RESPIRATORY_TRACT | 1 refills | Status: DC | PRN

## 2022-07-22 MED ORDER — FLOVENT HFA 110 MCG/ACT IN AERO: 2.0000 | INHALATION_SPRAY | Freq: Two times a day (BID) | RESPIRATORY_TRACT | 5 refills | Status: DC | PRN

## 2022-07-22 MED ORDER — FLUTICASONE PROPIONATE 50 MCG/ACT NA SUSP: 2.0000 | Freq: Every day | NASAL | 1 refills | Status: DC

## 2022-07-22 MED ORDER — AZELASTINE HCL 0.1 % NA SOLN: 1.0000 | Freq: Two times a day (BID) | NASAL | 5 refills | Status: DC | PRN

## 2022-07-22 NOTE — Patient Instructions (Addendum)
Asthma - Continue albuterol 2 puffs once or 1 vial nebulized every 4 hours as needed for cough or wheeze You may also use albuterol 2 puffs 5 to 15 minutes before activity to decrease cough or wheeze - For asthma flare, begin Flovent 110-2 puffs twice a day for 2 weeks or until cough and wheeze free.   Allergic rhinitis - Use nasal saline rinses before nose sprays such as with Neilmed Sinus Rinse.  Use distilled water.   - Use Flonase 2 sprays each nostril daily. Aim upward and outward. - Use Azelastine 1-2 sprays each nostril twice daily as needed. Aim upward and outward. - Use Zyrtec 10 mg daily as needed for runny nose/itchy watery eyes/itchy nose.   Chronic urticaria - Continue Xolair injections once every 4 weeks and have access to an epinephrine autoinjector set.  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 - for SKIN only reaction, okay to take Benadryl '50mg'$  capsules every 6 hours - for SKIN + ANY additional symptoms, OR IF concern for LIFE THREATENING reaction = Epipen Autoinjector EpiPen 0.3 mg. - If using Epinephrine autoinjector, call 911

## 2022-07-22 NOTE — Progress Notes (Signed)
FOLLOW UP Date of Service/Encounter:  07/22/22   Subjective:  Eric Cortez (DOB: [birth year]) is a 71 y.o. male who [facility] to [facility] 07[facility]22/2022 for follow up for CIU, allergic rhinitis, asthma, alpha gal allergy.   History obtained from: chart review and patient. Last seen 12/31/2021 with Gareth Morgan.  Asthma: well controlled on PRN albuterol, Flovent for illness/flare ups AR: Flonase, Zyrtec, Azelastine CIU: Xolair Alpha gal: consumes red meat regularly  Hives: Reports being well controlled with Xolair every 4 weeks.  Started Xolair 05/2019.  He has not had many flare ups since then.  He also reports going to an acupuncture in the past who helped reset his immune system. Reports at one point, he had missed Xolair due to insurance issues and he did not have any hives. At last visit, we had also tried to increase his interval with Xolair but it seems that he has gone back to every 4 weeks.   Rhinitis Reports doing well overall.  He intermittently has congestion/rhinorrhea but controlled with Flonase daily and Zyrtec PRN.  He did have a short illness in Sept that caused increased sinus drainage, cough, congestion, rhinorrhea.  No ocular symptoms.   Asthma Reports doing well, has not had much SOB/wheezing/coughing.  He did have an illness in Sept that caused him to have some coughing and SOB so he took albuterol for about 2-3 days.  No Flovent use.  No oral prednisone/nighttime sxs/ER visits.    Alpha Gal He continues to eat red meat without any issues.  There is concern that Xolair could be preventing his reactions.  There was a short time where there was an issue with his Xolair refill and he did not receive it for about 2 months; he still continued to eat red meat and did fine.  He has an Epipen but has not had to use it.   Past Medical History: Past Medical History:  Diagnosis Date   Acquired scoliosis    Age-related nuclear cataract, bilateral    Allergic  reaction to alpha-gal 12/31/2021   Allergy to meat 09/12/2015   Arthritis, degenerative 07/06/2002   Asthma    Atypical mole 2014   moderate left chest   CAD (coronary artery disease)    a. 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 01/2014 Echo: EF 45-50%, inferoseptal/inf sev HK, Gr 1 DD, nl valves.   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 Physical Exam: GEN: alert, well developed HEENT: clear conjunctiva, TM grey and translucent, nose with moderate inferior turbinate hypertrophy, pink nasal mucosa, no rhinorrhea, no cobblestoning HEART: regular rate and rhythm, no murmur LUNGS: clear to auscultation bilaterally, no coughing, unlabored respiration SKIN: no rashes or lesions  Spirometry:  Tracings reviewed. His effort: Good reproducible efforts. FVC: 3.62L FEV1: 2.72L, 93% predicted FEV1/FVC ratio: 75% Interpretation: Spirometry consistent with normal pattern.  Please see scanned spirometry results for details.    Assessment/Plan  Asthma - Continue albuterol 2  puffs once or 1 vial nebulized every 4 hours as needed for cough or wheeze You may also use albuterol 2 puffs 5 to 15 minutes before activity to decrease cough or wheeze - For asthma flare, begin Flovent 110-2 puffs twice a day for 2 weeks or until cough and wheeze free.   Allergic rhinitis - Use nasal saline rinses before nose sprays such as with Neilmed Sinus Rinse.  Use distilled water.   - Use Flonase 2 sprays each nostril daily. Aim upward and outward. - Use Azelastine 1-2 sprays each nostril twice daily as needed. Aim upward and outward. - Use Zyrtec 10 mg daily as needed for runny nose/itchy watery eyes/itchy nose.   Chronic urticaria - Continue Xolair injections once every 4 weeks and have access to an epinephrine autoinjector set.   - Brought up the idea of Xolair discontinuation as he has been controlled for a while and has not had recurrence of hives even when he missed the shot.  He is going to think about this.  If agreeable, at next visit, would consider stopping Xolair.    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 - for SKIN only reaction, okay to take Benadryl '50mg'$  capsules every 6 hours - for SKIN + ANY additional symptoms, OR IF concern for LIFE THREATENING reaction = Epipen Autoinjector EpiPen 0.3 mg. - If using Epinephrine autoinjector, call 911    Return in about 6 months (around 01/20/2023). Harlon Flor, MD  [facility]

## 2022-08-01 ENCOUNTER — Telehealth: Payer: Self-pay

## 2022-08-01 NOTE — Telephone Encounter (Signed)
Merry Proud, Pharmacist/[facility] called in - DOB verified- wanted to clarify/verify  provider prescription for patient:  Albuterol Sulfate (2.5 mg/3 mL) 0.083% nebulizer solution  2.5 mg (1 vial) every 6 hours as needed (PRN) for wheezing or shortness of breath.

## 2022-08-02 NOTE — Telephone Encounter (Signed)
[facility] called again and wanted to change prescription details on the Epipen. Almyra Free expressed that the prescription was specifically asking to dispense Mylan or Teva brand only. Almyra Free expressed that they go not carry those brands since they are a mail order pharmacy. Almyra Free expressed that she needed a verbal prescription to dispense the generic brand they have. Verbal was given as I asked to double check for any other medications that may need attention. Almyra Free expressed that she did not see anything else. I ensured that patient would get his medication without an issue and Almyra Free verbalized he would.

## 2022-08-15 ENCOUNTER — Encounter: Payer: Medicare PPO | Admitting: Psychology

## 2022-08-17 ENCOUNTER — Other Ambulatory Visit: Payer: Self-pay | Admitting: Physician Assistant

## 2022-08-17 DIAGNOSIS — I2510 Atherosclerotic heart disease of native coronary artery without angina pectoris: Secondary | ICD-10-CM

## 2022-09-03 ENCOUNTER — Encounter: Payer: Medicare PPO | Admitting: Psychology

## 2022-09-06 ENCOUNTER — Ambulatory Visit: Payer: Medicare PPO | Admitting: Podiatry

## 2022-09-13 ENCOUNTER — Encounter: Payer: Self-pay | Admitting: Physician Assistant

## 2022-09-13 ENCOUNTER — Ambulatory Visit (INDEPENDENT_AMBULATORY_CARE_PROVIDER_SITE_OTHER): Payer: Medicare PPO | Admitting: Physician Assistant

## 2022-09-13 ENCOUNTER — Ambulatory Visit (INDEPENDENT_AMBULATORY_CARE_PROVIDER_SITE_OTHER): Payer: Medicare PPO | Admitting: Podiatry

## 2022-09-13 ENCOUNTER — Encounter: Payer: Self-pay | Admitting: Podiatry

## 2022-09-13 VITALS — BP 165/95 | HR 70 | Ht 68.0 in | Wt 174.8 lb

## 2022-09-13 DIAGNOSIS — M7752 Other enthesopathy of left foot: Secondary | ICD-10-CM | POA: Diagnosis not present

## 2022-09-13 DIAGNOSIS — R4189 Other symptoms and signs involving cognitive functions and awareness: Secondary | ICD-10-CM | POA: Diagnosis not present

## 2022-09-13 MED ORDER — TRIAMCINOLONE ACETONIDE 10 MG/ML IJ SUSP
10.0000 mg | Freq: Once | INTRAMUSCULAR | Status: AC
  Administered 2022-09-13: 10 mg

## 2022-09-13 NOTE — Progress Notes (Signed)
Subjective:   Patient ID: [name], male   DOB: 72 y.o.   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  At this point continue to try conservative

## 2022-09-13 NOTE — Patient Instructions (Signed)
It was a pleasure to see you today at our office.   Recommendations:   Continue Memantine 5 mg tablets daily.   Follow up in  6 month   Whom to call:  Memory  decline, memory medications: Call our office [phone]   For psychiatric meds, mood meds: Please have your primary care physician manage these medications.    For assessment of decision of mental capacity and competency:  Call Dr. Anthoney Harada, geriatric psychiatrist at [phone]  For guidance in geriatric dementia issues please call [facility]    If you have any severe symptoms of a stroke, or other severe issues such as confusion,severe chills or fever, etc call 911 or go to the ER as you may need to be evaluated further   Feel free to visit Facebook page " [email or web address]" for tips of how to care for people with memory problems.     RECOMMENDATIONS FOR ALL PATIENTS WITH MEMORY PROBLEMS: 1. Continue to exercise (Recommend 30 minutes of walking everyday, or 3 hours every week) 2. Increase social interactions - continue going to [place] and enjoy social gatherings with friends and family 3. Eat healthy, avoid fried foods and eat more fruits and vegetables 4. Maintain adequate blood pressure, blood sugar, and blood cholesterol level. Reducing the risk of stroke and cardiovascular disease also helps promoting better memory. 5. Avoid stressful situations. Live a simple life and avoid aggravations. Organize your time and prepare for the next day in anticipation. 6. Sleep well, avoid any interruptions of sleep and avoid any distractions in the bedroom that may interfere with adequate sleep quality 7. Avoid sugar, avoid sweets as there is a strong link between excessive sugar intake, diabetes, and cognitive impairment We discussed the Mediterranean diet, which has been shown to help patients reduce the risk of progressive memory disorders and reduces cardiovascular risk. This includes eating fish, eat fruits and green leafy  vegetables, nuts like almonds and hazelnuts, walnuts, and also use olive oil. Avoid fast foods and fried foods as much as possible. Avoid sweets and sugar as sugar use has been linked to worsening of memory function.  There is always a concern of gradual progression of memory problems. If this is the case, then we may need to adjust level of care according to patient needs. Support, both to the patient and caregiver, should then be put into place.    FALL PRECAUTIONS: Be cautious when walking. Scan the area for obstacles that may increase the risk of trips and falls. When getting up in the mornings, sit up at the edge of the bed for a few minutes before getting out of bed. Consider elevating the bed at the head end to avoid drop of blood pressure when getting up. Walk always in a well-lit room (use night lights in the walls). Avoid area rugs or power cords from appliances in the middle of the walkways. Use a walker or a cane if necessary and consider physical therapy for balance exercise. Get your eyesight checked regularly.  FINANCIAL OVERSIGHT: Supervision, especially oversight when making financial decisions or transactions is also recommended.  HOME SAFETY: Consider the safety of the kitchen when operating appliances like stoves, microwave oven, and blender. Consider having supervision and share cooking responsibilities until no longer able to participate in those. Accidents with firearms and other hazards in the house should be identified and addressed as well.   ABILITY TO BE LEFT ALONE: If patient is unable to contact 911 operator, consider using [employer],  or when the need is there, arrange for someone to stay with patients. Smoking is a fire hazard, consider supervision or cessation. Risk of wandering should be assessed by caregiver and if detected at any point, supervision and safe proof recommendations should be instituted.  MEDICATION SUPERVISION: Inability to self-administer medication  needs to be constantly addressed. Implement a mechanism to ensure safe administration of the medications.   DRIVING: Regarding driving, in patients with progressive memory problems, driving will be impaired. We advise to have someone else do the driving if trouble finding directions or if minor accidents are reported. Independent driving assessment is available to determine safety of driving.   If you are interested in the driving assessment, you can contact the following:  [employer] in [place]  [place] [phone]  [place]  [facility] [phone] or [phone]

## 2022-09-13 NOTE — Progress Notes (Signed)
Assessment/Plan:   Subjective memory loss   Eric Cortez is a very pleasant 72 y.o. RH male with a history of asthma, presenting today in follow-up for evaluation of subjective memory loss he had a neurocognitive evaluation, "isolated impairment surrounding phonemic fluency. However, all other aspects of expressive language were appropriate.  Regarding the etiology, there may be a vascular contribution.  Subjective day-to-day dysfunction may be also influenced by hearing loss, ongoing anxiety and stress".  Patient is on memantine 5 mg nightly. 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  Last MoCA was 22/30.  In today's visit, he reports that his able to manage his stress better, his mood is improved, and he is now learning how to play music which is very therapeutic.  Overall, he denies any new memory complaints.   Recommendations:   Follow up in 6 months. Repeat neuropsychological evaluation if functional and cognitive decline is noted. Recommended sleep study for formal diagnosis of REM sleep behavior disorder, although patient reports today that the sleep is better controlled at this time.  He will discuss with PCP as well Continue memantine 5 mg nightly Recommend good control of cardiovascular risk factors Continue to control mood as per PCP    Subjective:   This patient is accompanied in the office by his wife who supplements the history. Previous records as well as any outside records available were reviewed prior to todays visit.   Patient was last seen on 03/13/2022    Any changes in memory since last visit?  "I don't thinks so ".  His wife reports that he is speech appears to be more fluent than prior.  He is learning to play the piano for the last 4 month to play classical  and blues, and she believes that this may be helping him. repeats oneself?  Endorsed Disoriented when walking into a room?  Patient denies Leaving objects in unusual places?  Patient denies   Wandering behavior?   denies   Any personality changes since last visit?  denies   Any worsening depression?: denies   Anxiety?  "It lessened some. Finally get the house organized and that helpful ".  His wife reports that the mood is improved, and that he is more prone to share his feelings to her Hallucinations or paranoia?  denies   Seizures?   denies    Any sleep changes?  Denies  vivid dreams, seldom REM behavior, denies sleepwalking   Sleep apnea?   denies   Any hygiene concerns?   denies   Independent of bathing and dressing?  Endorsed  Does the patient needs help with medications?  Patient is in charge Who is in charge of the finances?  Patient is in charge Any changes in appetite?  denies Patient have trouble swallowing?  denies   Does the patient cook?  No Any kitchen accidents such as leaving the stove on?   denies   Any headaches?    denies   Vision changes? denies Chronic back pain  denies   Ambulates with difficulty?  Walks daily.  Recently he had foot capsulitis, receiving injections, he is doing better Recent falls or head injuries?    denies     Unilateral weakness, numbness or tingling?   denies   Any tremors?  No   Any anosmia?  denies   Any incontinence of urine?  He has BPH with urgent incontinence sees Urology  Any bowel dysfunction?  Occasional constipation Patient lives with wife  Does the patient drive?  He continues to drive, he denies getting lost, he denies any issues.  Initial Visit 11/29/21 The patient is seen in neurologic consultation at the request of Newman Pies, MD for the evaluation of memory.  The patient is accompanied by  who supplements the history. This is a 72 y.o. year old RH  male who has had memory issues for about 2 years, although he says  that he is memory "always has not trouble, all my life, I am hard of hearing is a test I cannot understand ".  Currently, he is not using his hearing aids.  His wife states that he does repeat himself, and forgets recent conversations.  He never was diagnosed with ADD or ADHD, although his wife does suspect that he has it.  He denies being disoriented when walking into her room or moving objects in unusual places, however he "loses things constantly, and is always trying to find staff ".  He ambulates without difficulty, recently, he has been under the care of his neurosurgeon, for different areas of chronic pain, and spondylolisthesis, and he denies any worsening pain at this time, he is able to walk well, without any falls.  He does not need a cane or a walker to ambulate.  He denies any broad-based gait.  At age 72, he had a head injury when falling from a tree, which left the right parietal frontal area scar.  He also has being hit by a baseball when he was younger "cry on top of my head and he left the soft spot ".  He denies having had meningitis or encephalitis as a child, with a difficult birth.  He drives, and his wife states that sometimes he is still taking the shortcut, he takes the loan card, "he does not listen to me ".  This frustrates her.  He lives with her, and his mood is overall stable, denies any depression or irritability.  His sleep varies, he has occasional vivid dreams.  His wife states that he does not do sleepwalking, or REM behavior, however he has had moments in which she thought that he was having a seizure, he had jerky movements and possible restless leg syndrome.  His doctor placed him on a muscle relaxer, which has helped him.  He denies any history of seizures.  He denies any hallucinations or paranoia, there are no hygiene concerns, he is independent of bathing and dressing.  His medications are in a pillbox, he denies missing any doses.  He is in charge of his finances.  His  appetite is good, denies trouble swallowing, he does have mild increase in production of saliva.  He does not cook.  He denies any headaches, double vision, dizziness, focal numbness or tingling, unilateral weakness, or anosmia.  He has a history of mild intention tremors for the last 7 or 8 months.  He says that at times if he is left hand is weak, he gets muscle jerks, he drops objects.  He denies micrographia, he denies any voice changes.  He also reports muscle cramps over the last 6 months, "off and on and worse over the last month ".  He still able to do his activities of daily living. He denies any urine incontinence, retention, constipation or diarrhea.  Denies any history of sleep apnea,  alcohol or tobacco.  He denies any history of melanoma.  Family history negative for dementia or Parkinson's disease.    EEG 12/2021 was normal  No Known Allergies   Neuropsychological evaluation a 323, Dr. Melvyn Novas briefly, results suggested an isolated impairment surrounding phonemic fluency. However, all other aspects of expressive language were appropriate. Performances were also appropriate relative to age-matched peers across all other assessed cognitive domains. 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 Subjective day-to-day dysfunction may also be influenced by uncorrected hearing loss, ongoing anxiety, and, as reported by his wife, the fact that they have been under a "tremendous amount of stress" lately. This could certainly increase the risk for inefficient learning of new information and the experience of attentional and memory lapses, especially with heightened stress or when trying to multi-task.   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    Pertinent labs B12 203 PSA 4.7    Past Medical History:  Diagnosis Date   Acquired scoliosis    Age-related nuclear cataract, bilateral    Allergic reaction to alpha-gal 12/31/2021   Allergy to meat 09/12/2015   Arthritis, degenerative 07/06/2002   Asthma    Atypical mole 2014   moderate left chest   CAD (coronary artery disease)    a. 01/2014 Inf STEMI/PCI: LM nl, LAD 50p/m, LCX 30-31m OM1 50, OM2/3/4 nl, RCA (anomalous) 100p (3.5x23 Xience Alpine DES), EF 60;    Chest pain 10/19/2014   Chronic idiopathic urticaria 12/31/2021   Dry eye syndrome of bilateral lacrimal glands    Elevated PSA    Family history of malignant neoplasm of gastrointestinal tract 07/06/2002   Foot-drop    Hyperlipidemia, unspecified 07/06/2002   IMO Load 2018 R1.1   Hypertension    Ischemic cardiomyopathy    ab. 01/2014 Echo: EF 45-50%, inferoseptal/inf sev HK, Gr 1 DD, nl valves.   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   Pain in finger of left hand 08/29/2016   Perennial allergic rhinitis 12/31/2021   Presence of stent in right coronary artery 08/08/2016   Sensorineural hearing loss, bilateral    Skin lesion    Spinal stenosis of lumbar region    Spondylolisthesis, lumbar region 06/25/2021   Tremor      Past Surgical History:  Procedure Laterality Date   CORONARY STENT PLACEMENT  02/25/2014   LEFT HEART CATHETERIZATION WITH CORONARY ANGIOGRAM N/A 02/25/2014   Procedure: LEFT HEART CATHETERIZATION WITH CORONARY ANGIOGRAM;  Surgeon: HSinclair Grooms MD;  [facility];  Service: Cardiovascular;  Laterality: N/A;   PERCUTANEOUS CORONARY STENT INTERVENTION (PCI-S)  02/25/2014   Procedure: PERCUTANEOUS CORONARY STENT INTERVENTION (PCI-S);  Surgeon: HSinclair Grooms MD;  [facility];  Service: Cardiovascular;;   TONSILLECTOMY       PREVIOUS MEDICATIONS:   CURRENT MEDICATIONS:  Outpatient Encounter Medications  as of 09/13/2022  Medication Sig   albuterol (PROVENTIL) (2.5 MG/3ML) 0.083% nebulizer solution Take 3 mLs (2.5 mg total) by nebulization every 6 (six) hours as needed for wheezing or shortness of breath. Use one vial in nebulizer every four to six hours as needed for cough or wheeze.   albuterol (VENTOLIN HFA) 108 (90 Base) MCG/ACT inhaler Inhale 2 puffs into the lungs every 6 (  six) hours as needed for wheezing or shortness of breath.   alfuzosin (UROXATRAL) 10 MG 24 hr tablet Take 1 tablet (10 mg total) by mouth daily.   aspirin EC (ASPIRIN LOW DOSE) 81 MG tablet Take 1 tablet (81 mg total) by mouth daily.   atorvastatin (LIPITOR) 20 MG tablet Take 1 tablet (20 mg total) by mouth daily.   azelastine (ASTELIN) 0.1 % nasal spray Place 1 spray into both nostrils 2 (two) times daily as needed for rhinitis. Use in each nostril as directed   budesonide (PULMICORT) 180 MCG/ACT inhaler Inhale 2 puffs into the lungs 2 (two) times daily as needed (shortness of breath). Rinse, gargle, and spit after use.   buPROPion (WELLBUTRIN XL) 150 MG 24 hr tablet Take 1 tablet by mouth daily.   Coenzyme Q10 (COQ-10) 150 MG CAPS Take 150 mg by mouth daily.   cyclobenzaprine (FLEXERIL) 5 MG tablet Take by mouth.   EPINEPHrine 0.3 mg/0.3 mL IJ SOAJ injection Use for life-threatening allergic reactions   FLOVENT HFA 110 MCG/ACT inhaler Inhale 2 puffs into the lungs 2 (two) times daily as needed (with illness or asthma flare up).   fluticasone (FLONASE) 50 MCG/ACT nasal spray Place 2 sprays into both nostrils daily.   melatonin 5 MG TABS Take 5 mg by mouth at bedtime as needed (sleep).   memantine (NAMENDA) 5 MG tablet Take 1 tablet (5 mg total) by mouth daily.   nitroGLYCERIN (NITROSTAT) 0.4 MG SL tablet Place 1 tablet (0.4 mg total) under the tongue as needed for chest pain.   omalizumab Arvid Right) 150 MG/ML prefilled syringe Inject 300 mg into the skin every 28 (twenty-eight) days.   Facility-Administered Encounter  Medications as of 09/13/2022  Medication   omalizumab Arvid Right) injection 300 mg     Objective:     PHYSICAL EXAMINATION:    VITALS:   Vitals:   09/13/22 1042  BP: (!) 165/95  Pulse: 70  SpO2: 96%  Weight: 174 lb 12.8 oz (79.3 kg)  Height: '5\' 8"'$  (1.727 m)    GEN:  The patient appears stated age and is in NAD. HEENT:  Normocephalic, atraumatic.   Neurological examination:  General: NAD, well-groomed, appears stated age. Orientation: The patient is alert. Oriented to person, place and date Cranial nerves: There is good facial symmetry.The speech is fluent and clear. No aphasia or dysarthria. Fund of knowledge is appropriate. Recent memory impaired and remote memory is normal.  Attention and concentration are normal.  Able to name objects and repeat phrases.  Hearing is intact to conversational tone.  Sensation: Sensation is intact to Yoshino touch throughout Motor: Strength is at least antigravity x4. Tremors: none  DTR's 2/4 in UE/LE      11/29/2021    3:00 PM  Montreal Cognitive Assessment   Visuospatial/ Executive (0/5) 4  Naming (0/3) 3  Attention: Read list of digits (0/2) 2  Attention: Read list of letters (0/1) 1  Attention: Serial 7 subtraction starting at 100 (0/3) 3  Language: Repeat phrase (0/2) 1  Language : Fluency (0/1) 0  Abstraction (0/2) 1  Delayed Recall (0/5) 1  Orientation (0/6) 6  Total 22  Adjusted Score (based on education) 22        No data to display             Movement examination: Tone: There is normal tone in the UE/LE Abnormal movements:  no tremor.  No myoclonus.  No asterixis.   Coordination:  There is no decremation  with RAM's. Normal finger to nose  Gait and Station: The patient has no difficulty arising out of a deep-seated chair without the use of the hands. The patient's stride length is good.  Gait is cautious and narrow.   Thank you for allowing [place] the opportunity to participate in the care of this nice patient. Please  do not hesitate to contact us for any questions or concerns.   Total time spent on today's visit was 39 minutes dedicated to this patient today, preparing to see patient, examining the patient, ordering tests and/or medications and counseling the patient, documenting clinical information in the EHR or other health record, independently interpreting results and communicating results to the patient/family, discussing treatment and goals, answering patient's questions and coordinating care.  Cc:  Curlene Labrum, MD  Sharene Butters 09/13/2022 12:09 PM

## 2022-10-08 ENCOUNTER — Other Ambulatory Visit: Payer: Medicare PPO

## 2022-11-18 ENCOUNTER — Other Ambulatory Visit: Payer: Self-pay | Admitting: Urology

## 2022-11-18 DIAGNOSIS — N400 Enlarged prostate without lower urinary tract symptoms: Secondary | ICD-10-CM

## 2022-12-12 ENCOUNTER — Other Ambulatory Visit: Payer: Self-pay | Admitting: Family Medicine

## 2023-01-13 ENCOUNTER — Ambulatory Visit: Payer: Medicare PPO | Admitting: Podiatry

## 2023-01-20 ENCOUNTER — Ambulatory Visit: Payer: Medicare PPO | Admitting: Internal Medicine

## 2023-02-18 ENCOUNTER — Telehealth: Payer: Self-pay | Admitting: Physician Assistant

## 2023-02-18 MED ORDER — MEMANTINE HCL 5 MG PO TABS: 5.0000 mg | ORAL_TABLET | Freq: Every day | ORAL | 3 refills | Status: DC

## 2023-02-18 NOTE — Telephone Encounter (Signed)
Resent rx, patient called advised to call back if any more issues

## 2023-02-18 NOTE — Telephone Encounter (Signed)
Patient called stating that [facility] can't fill his prescription of Memantine 5mg .Evlyn Clines

## 2023-02-24 ENCOUNTER — Ambulatory Visit: Payer: Medicare PPO | Admitting: Podiatry

## 2023-02-24 DIAGNOSIS — Z91199 Patient's noncompliance with other medical treatment and regimen due to unspecified reason: Secondary | ICD-10-CM

## 2023-02-24 NOTE — Progress Notes (Signed)
No show

## 2023-03-14 ENCOUNTER — Ambulatory Visit: Payer: Medicare PPO | Admitting: Physician Assistant

## 2023-04-02 ENCOUNTER — Ambulatory Visit (INDEPENDENT_AMBULATORY_CARE_PROVIDER_SITE_OTHER): Payer: Medicare PPO | Admitting: Physician Assistant

## 2023-04-02 ENCOUNTER — Encounter: Payer: Self-pay | Admitting: Physician Assistant

## 2023-04-02 VITALS — BP 137/82 | HR 65 | Resp 18 | Ht 68.0 in | Wt 170.0 lb

## 2023-04-02 DIAGNOSIS — R4189 Other symptoms and signs involving cognitive functions and awareness: Secondary | ICD-10-CM

## 2023-04-02 MED ORDER — MEMANTINE HCL 5 MG PO TABS: 5.0000 mg | ORAL_TABLET | Freq: Every day | ORAL | 3 refills | Status: DC

## 2023-04-02 NOTE — Progress Notes (Unsigned)
Assessment/Plan:    Subjective Memory Loss with vascular component  Eric Cortez is a very pleasant 72 y.o. RH male with a history of asthma, anxiety, hearing loss, and a history of subjective memory loss with vascular contribution by Neuropsych evaluation 04/2022, presenting today in follow-up for evaluation of memory loss. Patient is on memantine 5 mg daily.  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 MoCA today is 26/30. Patient is able to participate on his IADLs and to drive without difficulty.    Recommendations:   Follow up in  6 months.  Replenish B12 (230) Continue Memantine 5 mg daily. Side effects were discussed  Recommend good control of cardiovascular risk factors Continue good stress management  Recommend hearing evaluation to improve comprehension  Continue to control mood as per PCP    Subjective:   This patient is accompanied in the office by his wife  who supplements the history. Previous records as well as any outside records available were reviewed prior to todays visit.  Patient was last seen on  09/13/2022     Any changes in memory since last visit? " I am doing fine" Patient has some difficulty remembering recent conversations and people names but not worse than prior . Likes to work around [employer], play golf, Research officer, trade union.  repeats oneself? Denies  Disoriented when walking into a room?  Patient denies  Leaving objects in unusual places?  Patient denies   Wandering behavior?   denies   Any personality changes since last visit?   denies   Any worsening depression?: denies   Hallucinations or paranoia?  denies   Seizures?   denies    Any sleep changes? Sleeps well. Denies vivid dreams, REM behavior or sleepwalking   Sleep apnea?    denies    Any hygiene concerns?   denies   Independent of bathing and dressing?  Endorsed  Does the patient needs help with medications? Patient is in charge   Who is in charge of the finances? Both of them in charge   Any changes in appetite?  denies     Patient have trouble swallowing?  denies   Does the patient cook?  Rarely  Any kitchen accidents such as leaving the stove on?   denies   Any headaches?    denies   Vision changes? denies Chronic pain?  denies   Ambulates with difficulty?  Denies.   Recent falls or head injuries?    denies      Unilateral weakness, numbness or tingling?   denies   Any tremors?  denies   Any anosmia?    denies   Any incontinence of urine? He has BPH, sees urologist, occasional incontinence.  Any bowel dysfunction?  denies      Patient lives with wife  Does the patient drive?yes, denies any issues   Neuropsych evaluation 04/2022 Briefly, results suggested an isolated impairment surrounding phonemic fluency. However, all other aspects of expressive language were appropriate. Performances were also appropriate relative to age-matched peers across all other assessed cognitive domains. 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 Subjective day-to-day dysfunction may also be influenced by uncorrected hearing loss, ongoing anxiety, and, as reported by  his wife, the fact that they have been under a "tremendous amount of stress" lately. This could certainly increase the risk for inefficient learning of new information and the experience of attentional and memory lapses, especially with heightened stress or when trying to multi-task.   Past Medical History:  Diagnosis Date   Acquired scoliosis    Age-related nuclear cataract, bilateral    Allergic reaction to alpha-gal 12/31/2021   Allergy to meat 09/12/2015   Arthritis,  degenerative 07/06/2002   Asthma    Atypical mole 2014   moderate left chest   CAD (coronary artery disease)    a. 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 01/2014 Echo: EF 45-50%, inferoseptal/inf sev HK, Gr 1 DD, nl valves.   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   Pain in finger of left hand 08/29/2016   Perennial allergic rhinitis 12/31/2021   Presence of stent in right coronary artery 08/08/2016   Sensorineural hearing loss, bilateral    Skin lesion    Spinal stenosis of lumbar region    Spondylolisthesis, lumbar region 06/25/2021   Tremor      Past Surgical History:  Procedure Laterality Date   CORONARY STENT PLACEMENT  02/25/2014   LEFT HEART CATHETERIZATION WITH CORONARY ANGIOGRAM N/A 02/25/2014   Procedure: LEFT HEART CATHETERIZATION WITH CORONARY ANGIOGRAM;  Surgeon: Lesleigh Noe, MD;  Location: [facility] CATH LAB;  Service: Cardiovascular;  Laterality: N/A;   PERCUTANEOUS CORONARY STENT INTERVENTION (PCI-S)  02/25/2014   Procedure: PERCUTANEOUS CORONARY STENT INTERVENTION (PCI-S);  Surgeon: Lesleigh Noe, MD;  Location: [facility] CATH LAB;  Service: Cardiovascular;;   TONSILLECTOMY       PREVIOUS MEDICATIONS:   CURRENT MEDICATIONS:  Outpatient Encounter Medications as of 04/02/2023  Medication Sig   albuterol (PROVENTIL) (2.5 MG/3ML) 0.083% nebulizer solution Take 3 mLs (2.5 mg total) by nebulization every 6 (six) hours as needed for wheezing or shortness of breath. Use one vial in nebulizer every four to six hours as needed  for cough or wheeze.   albuterol (VENTOLIN HFA) 108 (90 Base) MCG/ACT inhaler Inhale 2 puffs into the lungs every 6 (six) hours as needed for wheezing or shortness of breath.   alfuzosin (UROXATRAL) 10 MG 24 hr tablet TAKE 1 TABLET EVERY DAY   aspirin EC (ASPIRIN LOW DOSE) 81 MG tablet Take 1 tablet (81 mg total) by mouth daily.   atorvastatin (LIPITOR) 20 MG tablet Take 1 tablet (20 mg total) by mouth daily.   azelastine (ASTELIN) 0.1 % nasal spray Place 1 spray into both nostrils 2 (two) times daily as needed for rhinitis. Use in each nostril as directed   budesonide (PULMICORT) 180 MCG/ACT inhaler Inhale 2 puffs into the lungs 2 (two) times daily as needed (shortness of breath). Rinse, gargle, and spit after use.   buPROPion (WELLBUTRIN XL) 150 MG 24 hr tablet Take 1 tablet by mouth daily.   Coenzyme Q10 (COQ-10) 150 MG CAPS Take 150 mg by mouth daily.   cyclobenzaprine (FLEXERIL) 5 MG  tablet Take by mouth.   EPINEPHrine 0.3 mg/0.3 mL IJ SOAJ injection Use for life-threatening allergic reactions   FLOVENT HFA 110 MCG/ACT inhaler Inhale 2 puffs into the lungs 2 (two) times daily as needed (with illness or asthma flare up).   fluticasone (FLONASE) 50 MCG/ACT nasal spray USE 1 TO 2 SPRAYS IN EACH NOSTRIL EVERY DAY AS NEEDED   melatonin 5 MG TABS Take 5 mg by mouth at bedtime as needed (sleep).   nitroGLYCERIN (NITROSTAT) 0.4 MG SL tablet Place 1 tablet (0.4 mg total) under the tongue as needed for chest pain.   omalizumab Geoffry Paradise) 150 MG/ML prefilled syringe Inject 300 mg into the skin every 28 (twenty-eight) days.   [DISCONTINUED] memantine (NAMENDA) 5 MG tablet Take 1 tablet (5 mg total) by mouth daily.   memantine (NAMENDA) 5 MG tablet Take 1 tablet (5 mg total) by mouth daily.   Facility-Administered Encounter Medications as of 04/02/2023  Medication   omalizumab Geoffry Paradise) injection 300 mg     Objective:     PHYSICAL EXAMINATION:    VITALS:   Vitals:   04/02/23 1243  BP: 137/82   Pulse: 65  Resp: 18  SpO2: 98%  Weight: 170 lb (77.1 kg)  Height: 5\' 8"  (1.727 m)    GEN:  The patient appears stated age and is in NAD. HEENT:  Normocephalic, atraumatic.   Neurological examination:  General: NAD, well-groomed, appears stated age. Orientation: The patient is alert. Oriented to person, place and date Cranial nerves: There is good facial symmetry.The speech is fluent and clear. No aphasia or dysarthria. Fund of knowledge is appropriate. Recent memory impaired and remote memory is normal.  Attention and concentration are normal.  Able to name objects and repeat phrases.  Hearing is slightly decreased to conversational tone.   Delayed recall 3/5 Sensation: Sensation is intact to Kramm touch throughout Motor: Strength is at least antigravity x4. DTR's 2/4 in UE/LE      04/02/2023    1:00 PM 11/29/2021    3:00 PM  Montreal Cognitive Assessment   Visuospatial/ Executive (0/5) 4 4  Naming (0/3) 3 3  Attention: Read list of digits (0/2) 1 2  Attention: Read list of letters (0/1) 1 1  Attention: Serial 7 subtraction starting at 100 (0/3) 3 3  Language: Repeat phrase (0/2) 2 1  Language : Fluency (0/1) 1 0  Abstraction (0/2) 2 1  Delayed Recall (0/5) 3 1  Orientation (0/6) 6 6  Total 26 22  Adjusted Score (based on education) 26 22        No data to display             Movement examination: Tone: There is normal tone in the UE/LE Abnormal movements:  no tremor.  No myoclonus.  No asterixis.   Coordination:  There is no decremation with RAM's. Normal finger to nose  Gait and Station: The patient has no difficulty arising out of a deep-seated chair without the use of the hands. The patient's stride length is good.  Gait is cautious and narrow.   Thank you for allowing [place] the opportunity to participate in the care of this nice patient. Please do not hesitate to contact us for any questions or concerns.   Total time spent on today's visit was 37 minutes  dedicated to this patient today, preparing to see patient, examining the patient, ordering tests and/or medications and counseling the patient, documenting clinical information in the EHR or other health record, independently interpreting  results and communicating results to the patient/family, discussing treatment and goals, answering patient's questions and coordinating care.  Cc:  Juliette Alcide, MD  Marlowe Kays 04/02/2023 2:18 PM

## 2023-04-02 NOTE — Patient Instructions (Addendum)
It was a pleasure to see you today at our office.   Recommendations:   Continue Memantine 5 mg tablets daily.   Continue B12  Follow up in  6 month   Whom to call:  Memory  decline, memory medications: Call our office [phone]   For psychiatric meds, mood meds: Please have your primary care physician manage these medications.    For assessment of decision of mental capacity and competency:  Call Dr. Erick Blinks, geriatric psychiatrist at [phone]  For guidance in geriatric dementia issues please call [facility]    If you have any severe symptoms of a stroke, or other severe issues such as confusion,severe chills or fever, etc call 911 or go to the ER as you may need to be evaluated further   Feel free to visit Facebook page " [email or web address]" for tips of how to care for people with memory problems.     RECOMMENDATIONS FOR ALL PATIENTS WITH MEMORY PROBLEMS: 1. Continue to exercise (Recommend 30 minutes of walking everyday, or 3 hours every week) 2. Increase social interactions - continue going to [place] and enjoy social gatherings with friends and family 3. Eat healthy, avoid fried foods and eat more fruits and vegetables 4. Maintain adequate blood pressure, blood sugar, and blood cholesterol level. Reducing the risk of stroke and cardiovascular disease also helps promoting better memory. 5. Avoid stressful situations. Live a simple life and avoid aggravations. Organize your time and prepare for the next day in anticipation. 6. Sleep well, avoid any interruptions of sleep and avoid any distractions in the bedroom that may interfere with adequate sleep quality 7. Avoid sugar, avoid sweets as there is a strong link between excessive sugar intake, diabetes, and cognitive impairment We discussed the Mediterranean diet, which has been shown to help patients reduce the risk of progressive memory disorders and reduces cardiovascular risk. This includes eating fish, eat fruits  and green leafy vegetables, nuts like almonds and hazelnuts, walnuts, and also use olive oil. Avoid fast foods and fried foods as much as possible. Avoid sweets and sugar as sugar use has been linked to worsening of memory function.  There is always a concern of gradual progression of memory problems. If this is the case, then we may need to adjust level of care according to patient needs. Support, both to the patient and caregiver, should then be put into place.    FALL PRECAUTIONS: Be cautious when walking. Scan the area for obstacles that may increase the risk of trips and falls. When getting up in the mornings, sit up at the edge of the bed for a few minutes before getting out of bed. Consider elevating the bed at the head end to avoid drop of blood pressure when getting up. Walk always in a well-lit room (use night lights in the walls). Avoid area rugs or power cords from appliances in the middle of the walkways. Use a walker or a cane if necessary and consider physical therapy for balance exercise. Get your eyesight checked regularly.  FINANCIAL OVERSIGHT: Supervision, especially oversight when making financial decisions or transactions is also recommended.  HOME SAFETY: Consider the safety of the kitchen when operating appliances like stoves, microwave oven, and blender. Consider having supervision and share cooking responsibilities until no longer able to participate in those. Accidents with firearms and other hazards in the house should be identified and addressed as well.   ABILITY TO BE LEFT ALONE: If patient is unable to contact 911 operator,  consider using LifeLine, or when the need is there, arrange for someone to stay with patients. Smoking is a fire hazard, consider supervision or cessation. Risk of wandering should be assessed by caregiver and if detected at any point, supervision and safe proof recommendations should be instituted.  MEDICATION SUPERVISION: Inability to self-administer  medication needs to be constantly addressed. Implement a mechanism to ensure safe administration of the medications.   DRIVING: Regarding driving, in patients with progressive memory problems, driving will be impaired. We advise to have someone else do the driving if trouble finding directions or if minor accidents are reported. Independent driving assessment is available to determine safety of driving.   If you are interested in the driving assessment, you can contact the following:  [employer] in [place] [phone]  [facility] [phone]  [facility] [phone]  [employer] [phone] or [phone]

## 2023-04-14 NOTE — Progress Notes (Signed)
History of Present Illness:   12.08.2020: His presenting PSA, checked on 6.17.2014 (prior to his first visit) was 5.2. By history is PSA 2-3 years prior was 1.8, and his PSA in 2013 was 2.5.  His PSA was rechecked in August, 2014 and was 3.78. It was rechecked in November/2014 and was stable at 3.84. He was felt to have a moderately enlarged prostate, judged to be about 60 g by DRE, at the time of his first visit.   11.3.2020: PSA 7.4   12.8.2020: TRUS/Bx. prostate volume 52 mL.  1/12 cores--right apex medial--revealed small focus of atypia.   PSA:    10.09.2018: 4.6 10.15.2019: 4.8 11.03.2020: 7.4 8.31.2021: 3.5 8.30.2022: 4.2.   8.15.2023: PSA 4.7  8.13.2024: He is here today for routine visit.  He has not had a recent PSA.  No significant lower urinary tract symptoms.  Still on alfuzosin.  No urologic complaints.  Past Medical History:  Diagnosis Date   Acquired scoliosis    Age-related nuclear cataract, bilateral    Allergic reaction to alpha-gal 12/31/2021   Allergy to meat 09/12/2015   Arthritis, degenerative 07/06/2002   Asthma    Atypical mole 2014   moderate left chest   CAD (coronary artery disease)    a. 01/2014 Inf STEMI/PCI: LM nl, LAD 50p/m, LCX 30-35m, OM1 50, OM2/3/4 nl, RCA (anomalous) 100p (3.5x23 Xience Alpine DES), EF 60;    Chest pain 10/19/2014   Chronic idiopathic urticaria 12/31/2021   Dry eye syndrome of bilateral lacrimal glands    Elevated PSA    Family history of malignant neoplasm of gastrointestinal tract 07/06/2002   Foot-drop    Hyperlipidemia, unspecified 07/06/2002   IMO Load 2018 R1.1   Hypertension    Ischemic cardiomyopathy    ab. 01/2014 Echo: EF 45-50%, inferoseptal/inf sev HK, Gr 1 DD, nl valves.   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   Pain in finger of left hand 08/29/2016   Perennial allergic rhinitis 12/31/2021   Presence of stent in right coronary artery 08/08/2016   Sensorineural hearing  loss, bilateral    Skin lesion    Spinal stenosis of lumbar region    Spondylolisthesis, lumbar region 06/25/2021   Tremor     Past Surgical History:  Procedure Laterality Date   CORONARY STENT PLACEMENT  02/25/2014   LEFT HEART CATHETERIZATION WITH CORONARY ANGIOGRAM N/A 02/25/2014   Procedure: LEFT HEART CATHETERIZATION WITH CORONARY ANGIOGRAM;  Surgeon: Lesleigh Noe, MD;  Location: [facility] CATH LAB;  Service: Cardiovascular;  Laterality: N/A;   PERCUTANEOUS CORONARY STENT INTERVENTION (PCI-S)  02/25/2014   Procedure: PERCUTANEOUS CORONARY STENT INTERVENTION (PCI-S);  Surgeon: Lesleigh Noe, MD;  Location: [facility] CATH LAB;  Service: Cardiovascular;;   TONSILLECTOMY      Home Medications:  Allergies as of 04/15/2023   No Known Allergies      Medication List        Accurate as of April 14, 2023 12:25 PM. If you have any questions, ask your nurse or doctor.          albuterol (2.5 MG/3ML) 0.083% nebulizer solution Commonly known as: PROVENTIL Take 3 mLs (2.5 mg total) by nebulization every 6 (six) hours as needed for wheezing or shortness of breath. Use one vial in nebulizer every four to six hours as needed for cough or wheeze.   albuterol 108 (90 Base) MCG/ACT inhaler Commonly known as: VENTOLIN HFA Inhale 2 puffs into the lungs every 6 (six) hours  as needed for wheezing or shortness of breath.   alfuzosin 10 MG 24 hr tablet Commonly known as: UROXATRAL TAKE 1 TABLET EVERY DAY   aspirin EC 81 MG tablet Commonly known as: Aspirin Low Dose Take 1 tablet (81 mg total) by mouth daily.   atorvastatin 20 MG tablet Commonly known as: LIPITOR Take 1 tablet (20 mg total) by mouth daily.   azelastine 0.1 % nasal spray Commonly known as: ASTELIN Place 1 spray into both nostrils 2 (two) times daily as needed for rhinitis. Use in each nostril as directed   budesonide 180 MCG/ACT inhaler Commonly known as: PULMICORT Inhale 2 puffs into the lungs 2 (two) times daily as  needed (shortness of breath). Rinse, gargle, and spit after use.   buPROPion 150 MG 24 hr tablet Commonly known as: WELLBUTRIN XL Take 1 tablet by mouth daily.   CoQ-10 150 MG Caps Take 150 mg by mouth daily.   cyclobenzaprine 5 MG tablet Commonly known as: FLEXERIL Take by mouth.   EPINEPHrine 0.3 mg/0.3 mL Soaj injection Commonly known as: EPI-PEN Use for life-threatening allergic reactions   Flovent HFA 110 MCG/ACT inhaler Generic drug: fluticasone Inhale 2 puffs into the lungs 2 (two) times daily as needed (with illness or asthma flare up).   fluticasone 50 MCG/ACT nasal spray Commonly known as: FLONASE USE 1 TO 2 SPRAYS IN EACH NOSTRIL EVERY DAY AS NEEDED   melatonin 5 MG Tabs Take 5 mg by mouth at bedtime as needed (sleep).   memantine 5 MG tablet Commonly known as: NAMENDA Take 1 tablet (5 mg total) by mouth daily.   nitroGLYCERIN 0.4 MG SL tablet Commonly known as: NITROSTAT Place 1 tablet (0.4 mg total) under the tongue as needed for chest pain.   omalizumab 150 MG/ML prefilled syringe Commonly known as: Xolair Inject 300 mg into the skin every 28 (twenty-eight) days.        Allergies: No Known Allergies  Family History  Problem Relation Age of Onset   Colon cancer Mother    Memory loss Mother    Heart attack Father    Hypertension Father    Heart disease Father    Diabetes Father    Diverticulosis Sister    Stroke Paternal Grandmother    Stroke Paternal Grandfather    Heart disease Maternal Aunt    Schizophrenia Maternal Aunt     Social History:  reports that he has never smoked. He has never used smokeless tobacco. He reports current alcohol use of about 3.0 standard drinks of alcohol per week. He reports that he does not use drugs.  ROS: A complete review of systems was performed.  All systems are negative except for pertinent findings as noted.  Physical Exam:  Vital signs in last 24 hours: There were no vitals taken for this  visit. Constitutional:  Alert and oriented, No acute distress Cardiovascular: Regular rate  Respiratory: Normal respiratory effort GI: No inguinal hernias Genitourinary: Normal male phallus, testes are descended bilaterally and non-tender and without masses, scrotum is normal in appearance without lesions or masses, perineum is normal on inspection.  Prostate 60 g, symmetric, nonnodular, nontender. Neurologic: Grossly intact, no focal deficits Psychiatric: Normal mood and affect  I have reviewed prior pt notes  I have reviewed urinalysis results--clear  I have independently reviewed prior imaging--prostate ultrasound  I have reviewed prior PSA and pathology results  Most recent BMP reviewed   Impression/Assessment:  Elevated PSA with atypia seen on biopsy 4 years ago.  PSA has been stable, DRE normal  BPH, doing well on alfuzosin  Plan:  I will check his PSA today  Continue Uroxatrol  I will see back in a year

## 2023-04-15 ENCOUNTER — Encounter: Payer: Self-pay | Admitting: Urology

## 2023-04-15 ENCOUNTER — Ambulatory Visit (INDEPENDENT_AMBULATORY_CARE_PROVIDER_SITE_OTHER): Payer: Medicare PPO | Admitting: Urology

## 2023-04-15 VITALS — BP 151/83 | HR 60

## 2023-04-15 DIAGNOSIS — N400 Enlarged prostate without lower urinary tract symptoms: Secondary | ICD-10-CM

## 2023-04-15 DIAGNOSIS — R972 Elevated prostate specific antigen [PSA]: Secondary | ICD-10-CM

## 2023-04-15 LAB — URINALYSIS, ROUTINE W REFLEX MICROSCOPIC
Bilirubin, UA: NEGATIVE
Glucose, UA: NEGATIVE
Ketones, UA: NEGATIVE
Leukocytes,UA: NEGATIVE
Nitrite, UA: NEGATIVE
Protein,UA: NEGATIVE
RBC, UA: NEGATIVE
Specific Gravity, UA: 1.025 (ref 1.005–1.030)
Urobilinogen, Ur: 2 mg/dL — ABNORMAL HIGH (ref 0.2–1.0)
pH, UA: 6 (ref 5.0–7.5)

## 2023-04-21 ENCOUNTER — Other Ambulatory Visit: Payer: Self-pay | Admitting: Urology

## 2023-04-21 ENCOUNTER — Telehealth: Payer: Self-pay

## 2023-04-21 DIAGNOSIS — R972 Elevated prostate specific antigen [PSA]: Secondary | ICD-10-CM

## 2023-04-21 NOTE — Telephone Encounter (Signed)
Patient is made aware and voiced understanding. 

## 2023-04-21 NOTE — Telephone Encounter (Signed)
-----   Message from Bertram Millard Dahlstedt sent at 04/21/2023 11:38 AM EDT ----- I notified pt via my chart his psa result. I would like to recheck in 6 mos--I put that order in ----- Message ----- From: Troy Sine, CMA Sent: 04/16/2023   9:25 AM EDT To: Marcine Matar, MD  Please review

## 2023-04-29 ENCOUNTER — Telehealth: Payer: Self-pay

## 2023-04-29 NOTE — Telephone Encounter (Signed)
Tried calling patient with no answer, left vm for return call PSA up a point from last year. I don't think we need to do anything different at this point, but I would recommend rechecking again in 6 months. We will contact you about that.

## 2023-06-05 ENCOUNTER — Encounter: Payer: Self-pay | Admitting: Internal Medicine

## 2023-06-05 ENCOUNTER — Ambulatory Visit: Payer: Medicare PPO | Attending: Internal Medicine | Admitting: Internal Medicine

## 2023-06-05 VITALS — BP 138/78 | HR 61 | Ht 68.0 in | Wt 168.0 lb

## 2023-06-05 DIAGNOSIS — I2510 Atherosclerotic heart disease of native coronary artery without angina pectoris: Secondary | ICD-10-CM

## 2023-06-05 DIAGNOSIS — I255 Ischemic cardiomyopathy: Secondary | ICD-10-CM | POA: Diagnosis not present

## 2023-06-05 DIAGNOSIS — E785 Hyperlipidemia, unspecified: Secondary | ICD-10-CM | POA: Diagnosis not present

## 2023-06-05 DIAGNOSIS — I5022 Chronic systolic (congestive) heart failure: Secondary | ICD-10-CM

## 2023-06-05 DIAGNOSIS — I10 Essential (primary) hypertension: Secondary | ICD-10-CM | POA: Diagnosis not present

## 2023-06-05 NOTE — Progress Notes (Signed)
  Cardiology Office Note:  .    Date:  06/05/2023  ID:  Eric Cortez, DOB 1951/03/04, MRN [id number] PCP: Juliette Alcide, MD  [facility] Providers Cardiologist:  Christell Constant, MD     CC: Transition to new cardiologist.  History of Present Illness: .    Eric Cortez is a 72 y.o. male with prior STEMI in 2015 with associated decrease in LVEF who presents after seeing Dr. Katrinka Blazing (2015-2023) to establish care.  Discussed the use of AI scribe software for clinical note transcription with the patient, who gave verbal consent to proceed.  History of Present Illness         Eric Cortez, a 72 year old male with a history of coronary artery disease status post inferior STEMI in 2015, DES to RCA, hypertension, hyperlipidemia, and ischemic cardiomyopathy with an EF of 45-50%, presents for transition of care from a retired cardiologist. He also has subjective memory loss and is currently under the care of a neurology PA. He reports feeling well and is physically active, working in a garden, mowing six acres of grass, playing golf, and walking uphill and downhill to his mailbox. He denies any recent chest discomfort similar to his 2015 heart attack and reports good breathing with no palpitations. He occasionally experiences muscle aches after physical work; this does not appear to be related to his statin. His LDL was last checked in May of this year and was reported to be around 75.  Relevant histories: .  Social- former Dr. Katrinka Blazing patient, Golfer ROS: As per HPI.   Physical Exam:    VS:  BP 138/78   Pulse 61   Ht 5\' 8"  (1.727 m)   Wt 168 lb (76.2 kg)   SpO2 95%   BMI 25.54 kg/m    Wt Readings from Last 3 Encounters:  06/05/23 168 lb (76.2 kg)  04/02/23 170 lb (77.1 kg)  09/13/22 174 lb 12.8 oz (79.3 kg)    Gen: no distress  Ears:  Homero Fellers Sign Cardiac: No Rubs or Gallops, no murmur, RRR +2 radial pulses Respiratory: Clear to auscultation bilaterally, normal effort,  normal  respiratory rate GI: Soft, nontender, non-distended  MS: No edema;  moves all extremities Integument: Skin feels warm Neuro:  At time of evaluation, alert and oriented to person/place/time/situation  Psych: Normal affect, patient feels well   ASSESSMENT AND PLAN: .    Coronary Artery Disease (CAD) - Status post inferior STEMI in 2015 with DES to RCA. No recent chest discomfort. EKG shows persistent inferior infarct pattern. -Continue current management.  Hyperlipidemia - LDL slightly above target at 75. Discussed options for further reduction including increased physical activity and dietary changes. -Encourage increased physical activity and dietary modifications. 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 No recent symptoms of heart failure. -Order echocardiogram to reassess EF prior to starting GDMT  Follow-up -Return to clinic in 1 year unless issues arise with labs or echocardiogram results.   Riley Lam, MD [facility] Cardiologist [facility]  [address] [place], [address] [place], [place] [address] [phone]  11:19 AM

## 2023-06-05 NOTE — Patient Instructions (Signed)
Medication Instructions:  Your physician recommends that you continue on your current medications as directed. Please refer to the Current Medication list given to you today.  *If you need a refill on your cardiac medications before your next appointment, please call your pharmacy*   Lab Work: IN 3 MONTHS: FLP, ALT, LPA ( nothing to eat or drink 12 hours prior)  If you have labs (blood work) drawn today and your tests are completely normal, you will receive your results only by: MyChart Message (if you have MyChart) OR A paper copy in the mail If you have any lab test that is abnormal or we need to change your treatment, we will call you to review the results.   Testing/Procedures: Your physician has requested that you have an echocardiogram. Echocardiography is a painless test that uses sound waves to create images of your heart. It provides your doctor with information about the size and shape of your heart and how well your heart's chambers and valves are working. This procedure takes approximately one hour. There are no restrictions for this procedure. Please do NOT wear cologne, perfume, aftershave, or lotions (deodorant is allowed). Please arrive 15 minutes prior to your appointment time.    Follow-Up: At [facility], you and your health needs are our priority.  As part of our continuing mission to provide you with exceptional heart care, we have created designated Provider Care Teams.  These Care Teams include your primary Cardiologist (physician) and Advanced Practice Providers (APPs -  Physician Assistants and Nurse Practitioners) who all work together to provide you with the care you need, when you need it.    Your next appointment:   1 year(s)  Provider:   Christell Constant, MD

## 2023-06-25 ENCOUNTER — Ambulatory Visit (HOSPITAL_COMMUNITY): Payer: Medicare PPO | Attending: Internal Medicine

## 2023-06-25 DIAGNOSIS — I10 Essential (primary) hypertension: Secondary | ICD-10-CM | POA: Insufficient documentation

## 2023-06-25 DIAGNOSIS — I5022 Chronic systolic (congestive) heart failure: Secondary | ICD-10-CM | POA: Insufficient documentation

## 2023-06-25 DIAGNOSIS — E785 Hyperlipidemia, unspecified: Secondary | ICD-10-CM | POA: Insufficient documentation

## 2023-06-25 DIAGNOSIS — I2510 Atherosclerotic heart disease of native coronary artery without angina pectoris: Secondary | ICD-10-CM | POA: Diagnosis present

## 2023-06-25 LAB — ECHOCARDIOGRAM COMPLETE
Area-P 1/2: 3.42 cm2
S' Lateral: 2.3 cm

## 2023-07-16 ENCOUNTER — Telehealth: Payer: Self-pay | Admitting: Internal Medicine

## 2023-07-16 MED ORDER — OMALIZUMAB 150 MG/ML ~~LOC~~ SOSY: 300.0000 mg | PREFILLED_SYRINGE | SUBCUTANEOUS | 11 refills | Status: DC

## 2023-07-16 NOTE — Telephone Encounter (Signed)
Rx sent to [facility]. 

## 2023-07-16 NOTE — Telephone Encounter (Signed)
Patient called stating he needs a new Xolair presciption. Patient states his Xolair prescription expired. Patient delivery pharmacy for Xolair is [facility].

## 2023-07-22 ENCOUNTER — Other Ambulatory Visit: Payer: Self-pay | Admitting: *Deleted

## 2023-07-22 MED ORDER — OMALIZUMAB 150 MG/ML ~~LOC~~ SOSY: 300.0000 mg | PREFILLED_SYRINGE | SUBCUTANEOUS | 11 refills | Status: DC

## 2023-08-08 ENCOUNTER — Telehealth: Payer: Self-pay | Admitting: *Deleted

## 2023-08-08 MED ORDER — ATORVASTATIN CALCIUM 20 MG PO TABS: 20.0000 mg | ORAL_TABLET | Freq: Every day | ORAL | 3 refills | Status: AC

## 2023-08-08 NOTE — Telephone Encounter (Signed)
S/w pt to see what dose of atorvastatin pt was taking due to last ov note.  Per Dr. Debby Bud last ov note pt was to increase med. Pt stated will not increase atorvastatin but will continue to take the 20 mg. Sent into today [facility] for # 90 day supply.

## 2023-09-02 ENCOUNTER — Telehealth: Payer: Self-pay | Admitting: Internal Medicine

## 2023-09-02 NOTE — Telephone Encounter (Signed)
Patient's wife is requesting for [place] to mail the lab orders to their house. Please advise.

## 2023-09-02 NOTE — Telephone Encounter (Signed)
Left voicemail to return call to office.

## 2023-09-04 ENCOUNTER — Telehealth: Payer: Self-pay | Admitting: *Deleted

## 2023-09-04 NOTE — Telephone Encounter (Signed)
 Spoke to patient wife regarding PAP denial for Xolair  several weeks ago. She called wanting to discuss how to get same going forward. I talked to her about PPP plan for this year and getting XOlair  through pharmacy but will need updated note in order to get Ins approval. Appt made

## 2023-09-05 ENCOUNTER — Other Ambulatory Visit: Payer: Self-pay

## 2023-09-05 ENCOUNTER — Other Ambulatory Visit: Payer: Medicare PPO

## 2023-09-05 DIAGNOSIS — I2510 Atherosclerotic heart disease of native coronary artery without angina pectoris: Secondary | ICD-10-CM

## 2023-09-05 DIAGNOSIS — I10 Essential (primary) hypertension: Secondary | ICD-10-CM

## 2023-09-05 DIAGNOSIS — E785 Hyperlipidemia, unspecified: Secondary | ICD-10-CM

## 2023-09-05 NOTE — Telephone Encounter (Signed)
 Lipids, ALT, and ApoB orders released. Placed requisitions in the mail today.

## 2023-09-07 ENCOUNTER — Other Ambulatory Visit: Payer: Self-pay | Admitting: Urology

## 2023-09-07 DIAGNOSIS — N400 Enlarged prostate without lower urinary tract symptoms: Secondary | ICD-10-CM

## 2023-09-09 ENCOUNTER — Ambulatory Visit: Payer: Medicare PPO | Admitting: Allergy and Immunology

## 2023-09-09 ENCOUNTER — Encounter: Payer: Self-pay | Admitting: Allergy and Immunology

## 2023-09-09 ENCOUNTER — Other Ambulatory Visit: Payer: Self-pay

## 2023-09-09 VITALS — BP 130/78 | HR 78 | Temp 78.0°F | Resp 18 | Ht 68.0 in | Wt 169.0 lb

## 2023-09-09 DIAGNOSIS — L501 Idiopathic urticaria: Secondary | ICD-10-CM | POA: Diagnosis not present

## 2023-09-09 DIAGNOSIS — J4520 Mild intermittent asthma, uncomplicated: Secondary | ICD-10-CM

## 2023-09-09 DIAGNOSIS — J3089 Other allergic rhinitis: Secondary | ICD-10-CM | POA: Diagnosis not present

## 2023-09-09 DIAGNOSIS — Z91018 Allergy to other foods: Secondary | ICD-10-CM

## 2023-09-09 MED ORDER — FLUTICASONE PROPIONATE HFA 44 MCG/ACT IN AERO: 2.0000 | INHALATION_SPRAY | RESPIRATORY_TRACT | 5 refills | Status: DC | PRN

## 2023-09-09 MED ORDER — FLUTICASONE PROPIONATE 50 MCG/ACT NA SUSP: NASAL | 3 refills | Status: AC

## 2023-09-09 MED ORDER — CETIRIZINE HCL 10 MG PO TABS: 10.0000 mg | ORAL_TABLET | Freq: Every day | ORAL | 3 refills | Status: DC

## 2023-09-09 MED ORDER — ALBUTEROL SULFATE HFA 108 (90 BASE) MCG/ACT IN AERS: 2.0000 | INHALATION_SPRAY | Freq: Four times a day (QID) | RESPIRATORY_TRACT | 1 refills | Status: AC | PRN

## 2023-09-09 NOTE — Patient Instructions (Addendum)
  1.  Discontinue Xolair  injections at this point in time  2.  If needed:   A. Albuterol +Fluticasone  44 -2 inhalations TOGETHER every 4-6 hours  B. Epi-Pen, benadryl, MD/ER evaluation for allergic reaction  C. Flonase  - 1-2 sprays each nostril 1-7 times per week  D. Cetirizine  10 mg - 1 tablet 1 time per day  3. Restart Xolair  injections???  4. Influenza = Tamiflu. Covid = Paxlovid  5. Return to clinic in 1 year or earlier if problem

## 2023-09-09 NOTE — Progress Notes (Signed)
 [place] - [place] - [place]   Follow-up Note  Referring Provider: Lari Elspeth BRAVO, MD Primary Provider: Lari Elspeth BRAVO, MD Date of Office Visit: 09/09/2023  Subjective:   Eric Cortez (DOB: May 24, 1951) is a 73 y.o. male who [facility] in re-evaluation of the following:  HPI: Eric Cortez returns to this clinic in evaluation of chronic urticaria, allergic rhinitis, intermittent asthma, alpha gal syndrome.  I last saw him in this clinic 22 July 2022.  He has been using Xolair  injections since 2020 and has had no issues with hives and has no need to use any cetirizine .  His airway issue has basically melted away and he rarely uses any albuterol  and rarely uses any nasal steroids.  He has not required a systemic steroid or antibiotic for an airway issue in years.  He continues to eat beef and pork at this point in time with no difficulty at all and no allergic reactions.  Allergies as of 09/09/2023   No Known Allergies      Medication List    albuterol  (2.5 MG/3ML) 0.083% nebulizer solution Commonly known as: PROVENTIL  Take 3 mLs (2.5 mg total) by nebulization every 6 (six) hours as needed for wheezing or shortness of breath. Use one vial in nebulizer every four to six hours as needed for cough or wheeze.   albuterol  108 (90 Base) MCG/ACT inhaler Commonly known as: VENTOLIN  HFA Inhale 2 puffs into the lungs every 6 (six) hours as needed for wheezing or shortness of breath.   alfuzosin  10 MG 24 hr tablet Commonly known as: UROXATRAL  TAKE 1 TABLET EVERY DAY   aspirin  EC 81 MG tablet Commonly known as: Aspirin  Low Dose Take 1 tablet (81 mg total) by mouth daily.   atorvastatin  20 MG tablet Commonly known as: LIPITOR  Take 1 tablet (20 mg total) by mouth daily.   azelastine  0.1 % nasal spray Commonly known as: ASTELIN  Place 1 spray into both nostrils 2 (two) times daily as needed for rhinitis.  Use in each nostril as directed   budesonide  180 MCG/ACT inhaler Commonly known as: PULMICORT  Inhale 2 puffs into the lungs 2 (two) times daily as needed (shortness of breath). Rinse, gargle, and spit after use.   buPROPion  150 MG 24 hr tablet Commonly known as: WELLBUTRIN  XL Take 1 tablet by mouth daily.   CoQ-10 150 MG Caps Take 150 mg by mouth daily.   cyclobenzaprine  5 MG tablet Commonly known as: FLEXERIL  Take by mouth.   EPINEPHrine  0.3 mg/0.3 mL Soaj injection Commonly known as: EPI-PEN Use for life-threatening allergic reactions   Flovent  HFA 110 MCG/ACT inhaler Generic drug: fluticasone  Inhale 2 puffs into the lungs 2 (two) times daily as needed (with illness or asthma flare up).   fluticasone  50 MCG/ACT nasal spray Commonly known as: FLONASE  USE 1 TO 2 SPRAYS IN EACH NOSTRIL EVERY DAY AS NEEDED   melatonin 5 MG Tabs Take 5 mg by mouth at bedtime as needed (sleep).   memantine  5 MG tablet Commonly known as: NAMENDA  Take 1 tablet (5 mg total) by mouth daily.   nitroGLYCERIN  0.4 MG SL tablet Commonly known as: NITROSTAT  Place 1 tablet (0.4 mg total) under the tongue as needed for chest pain.   omalizumab  150 MG/ML prefilled syringe Commonly known as: Xolair  Inject 300 mg into the skin every 28 (twenty-eight) days.    Past Medical History:  Diagnosis Date   Acquired scoliosis  Age-related nuclear cataract, bilateral    Allergic reaction to alpha-gal 12/31/2021   Allergy to meat 09/12/2015   Arthritis, degenerative 07/06/2002   Asthma    Atypical mole 2014   moderate left chest   CAD (coronary artery disease)    a. 01/2014 Inf STEMI/PCI: LM nl, LAD 50p/m, LCX 30-82m, OM1 50, OM2/3/4 nl, RCA (anomalous) 100p (3.5x23 Xience Alpine DES), EF 60;    Chest pain 10/19/2014   Chronic idiopathic urticaria 12/31/2021   Dry eye syndrome of bilateral lacrimal glands    Elevated PSA    Family history of malignant neoplasm of gastrointestinal tract 07/06/2002    Foot-drop    Hyperlipidemia, unspecified 07/06/2002   IMO Load 2018 R1.1   Hypertension    Ischemic cardiomyopathy    ab. 01/2014 Echo: EF 45-50%, inferoseptal/inf sev HK, Gr 1 DD, nl valves.   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   Pain in finger of left hand 08/29/2016   Perennial allergic rhinitis 12/31/2021   Presence of stent in right coronary artery 08/08/2016   Sensorineural hearing loss, bilateral    Skin lesion    Spinal stenosis of lumbar region    Spondylolisthesis, lumbar region 06/25/2021   Tremor     Past Surgical History:  Procedure Laterality Date   CORONARY STENT PLACEMENT  02/25/2014   LEFT HEART CATHETERIZATION WITH CORONARY ANGIOGRAM N/A 02/25/2014   Procedure: LEFT HEART CATHETERIZATION WITH CORONARY ANGIOGRAM;  Surgeon: Victory LELON Claudene DOUGLAS, MD;  Location: [facility] CATH LAB;  Service: Cardiovascular;  Laterality: N/A;   PERCUTANEOUS CORONARY STENT INTERVENTION (PCI-S)  02/25/2014   Procedure: PERCUTANEOUS CORONARY STENT INTERVENTION (PCI-S);  Surgeon: Victory LELON Claudene DOUGLAS, MD;  Location: [facility] CATH LAB;  Service: Cardiovascular;;   TONSILLECTOMY      Review of systems negative except as noted in HPI / PMHx or noted below:  Review of Systems  Constitutional: Negative.   HENT: Negative.    Eyes: Negative.   Respiratory: Negative.    Cardiovascular: Negative.   Gastrointestinal: Negative.   Genitourinary: Negative.   Musculoskeletal: Negative.   Skin: Negative.   Neurological: Negative.   Endo/Heme/Allergies: Negative.   Psychiatric/Behavioral: Negative.       Objective:   Vitals:   09/09/23 1434  BP: 130/78  Pulse: 78  Resp: 18  Temp: (!) 78 F (25.6 C)  SpO2: 97%   Height: 5' 8 (172.7 cm)  Weight: 169 lb (76.7 kg)   Physical Exam Constitutional:      Appearance: He is not diaphoretic.  HENT:     Head: Normocephalic.     Right Ear: Tympanic membrane, ear canal and external ear normal.     Left Ear: Tympanic  membrane, ear canal and external ear normal.     Nose: Nose normal. No mucosal edema or rhinorrhea.     Mouth/Throat:     Pharynx: Uvula midline. No oropharyngeal exudate.  Eyes:     Conjunctiva/sclera: Conjunctivae normal.  Neck:     Thyroid: No thyromegaly.     Trachea: Trachea normal. No tracheal tenderness or tracheal deviation.  Cardiovascular:     Rate and Rhythm: Normal rate and regular rhythm.     Heart sounds: Normal heart sounds, S1 normal and S2 normal. No murmur heard. Pulmonary:     Effort: No respiratory distress.     Breath sounds: Normal breath sounds. No stridor. No wheezing or rales.  Lymphadenopathy:     Head:     Right side of  head: No tonsillar adenopathy.     Left side of head: No tonsillar adenopathy.     Cervical: No cervical adenopathy.  Skin:    Findings: No erythema or rash.     Nails: There is no clubbing.  Neurological:     Mental Status: He is alert.     Diagnostics: none  Assessment and Plan:   1. Chronic idiopathic urticaria   2. Mild intermittent asthma without complication   3. Perennial allergic rhinitis   4. History of food allergy     1.  Discontinue Xolair  injections at this point in time  2.  If needed:   A. Albuterol +Fluticasone  44 -2 inhalations TOGETHER every 4-6 hours  B. Epi-Pen, benadryl, MD/ER evaluation for allergic reaction  C. Flonase  - 1-2 sprays each nostril 1-7 times per week  D. Cetirizine  10 mg - 1 tablet 1 time per day  3. Restart Xolair  injections???  4. Influenza = Tamiflu. Covid = Paxlovid  5. Return to clinic in 1 year or earlier if problem  [place] will discontinue his Xolair  injections at this point in time and we will see what happens as he eliminates this anti-IgE antibody regarding his chronic urticaria and his respiratory tract disease.  Hopefully he will continue to do well and will not require reinstitution of multiple medications to control these issues.  He certainly has a collection of other  agents that he can utilize should they be required.  He will keep in contact with us  noting his response to this approach and if he does well we will see him back in this clinic in 1 year or earlier if there is a problem.  Camellia Denis, MD Allergy / Immunology [facility]

## 2023-09-10 ENCOUNTER — Encounter: Payer: Self-pay | Admitting: Allergy and Immunology

## 2023-09-17 ENCOUNTER — Ambulatory Visit: Payer: Medicare PPO | Admitting: Family Medicine

## 2023-09-18 ENCOUNTER — Telehealth: Payer: Self-pay | Admitting: Allergy and Immunology

## 2023-09-18 MED ORDER — FLUTICASONE PROPIONATE HFA 44 MCG/ACT IN AERO: 2.0000 | INHALATION_SPRAY | RESPIRATORY_TRACT | 5 refills | Status: DC | PRN

## 2023-09-18 NOTE — Telephone Encounter (Signed)
Per AVS from Dr Lucie Leather take albuterol and flovent together 2 puffs q 4-6 hours I have fixed the rx and sent it back in per Dr Johney Maine not to [facility]

## 2023-09-18 NOTE — Telephone Encounter (Signed)
[facility] called requesting clarification on the Flovent inhaler that was prescribed to the patient. The pharmacy states they need the frequency of the medication as in once daily,twice daily as needed. Pharmacy's call back number is [phone].

## 2023-09-30 ENCOUNTER — Other Ambulatory Visit: Payer: Medicare PPO

## 2023-10-01 LAB — LIPID PANEL
Chol/HDL Ratio: 3.4 {ratio} (ref 0.0–5.0)
Cholesterol, Total: 139 mg/dL (ref 100–199)
HDL: 41 mg/dL (ref >39)
LDL Chol Calc (NIH): 84 mg/dL (ref 0–99)
Triglycerides: 72 mg/dL (ref 0–149)
VLDL Cholesterol Cal: 14 mg/dL (ref 5–40)

## 2023-10-01 LAB — APOLIPOPROTEIN B: Apolipoprotein B: 77 mg/dL (ref <90)

## 2023-10-01 LAB — ALT: ALT: 18 [IU]/L (ref 0–44)

## 2023-10-01 LAB — PSA: Prostate Specific Ag, Serum: 12.9 ng/mL — ABNORMAL HIGH (ref 0.0–4.0)

## 2023-10-03 ENCOUNTER — Ambulatory Visit: Payer: Self-pay | Admitting: Physician Assistant

## 2023-10-03 NOTE — Progress Notes (Signed)
 History of Present Illness:   12.08.2020: His presenting PSA, checked on 6.17.2014 (prior to his first visit) was 5.2. By history is PSA 2-3 years prior was 1.8, and his PSA in 2013 was 2.5.  His PSA was rechecked in August, 2014 and was 3.78. It was rechecked in November/2014 and was stable at 3.84. He was felt to have a moderately enlarged prostate, judged to be about 60 g by DRE, at the time of his first visit.   11.3.2020: PSA 7.4   12.8.2020: TRUS/Bx. prostate volume 52 mL.  1/12 cores--right apex medial--revealed small focus of atypia.   PSA:    10.09.2018: 4.6 10.15.2019: 4.8 11.03.2020: 7.4 8.31.2021: 3.5 8.30.2022: 4.2.   8.15.2023: PSA 4.7  8.13.2024: PSA 5.7  2.4.2025: Here for recheck.  Most recent PSA 12.9.  He has stable but bothersome urinary symptomatology and is somewhat interested in a UroLift.  He was treated with a short course of antibiotics about 2 months ago for what sounds like prostatitis-lower urinary tract symptoms worsening, dark, cloudy urine, shakes, chills.  Apparently he was only on cephalexin for about a week.  Past Medical History:  Diagnosis Date   Acquired scoliosis    Age-related nuclear cataract, bilateral    Allergic reaction to alpha-gal 12/31/2021   Allergy to meat 09/12/2015   Arthritis, degenerative 07/06/2002   Asthma    Atypical mole 2014   moderate left chest   CAD (coronary artery disease)    a. 01/2014 Inf STEMI/PCI: LM nl, LAD 50p/m, LCX 30-12m, OM1 50, OM2/3/4 nl, RCA (anomalous) 100p (3.5x23 Xience Alpine DES), EF 60;    Chest pain 10/19/2014   Chronic idiopathic urticaria 12/31/2021   Dry eye syndrome of bilateral lacrimal glands    Elevated PSA    Family history of malignant neoplasm of gastrointestinal tract 07/06/2002   Foot-drop    Hyperlipidemia, unspecified 07/06/2002   IMO Load 2018 R1.1   Hypertension    Ischemic cardiomyopathy    ab. 01/2014 Echo: EF 45-50%, inferoseptal/inf sev HK, Gr 1 DD, nl valves.   Lumbago  07/06/2002   Myocardial infarction of interior wall 02/27/2014   Pain in finger of left hand 08/29/2016   Perennial allergic rhinitis 12/31/2021   Presence of stent in right coronary artery 08/08/2016   Sensorineural hearing loss, bilateral    Skin lesion    Spinal stenosis of lumbar region    Spondylolisthesis, lumbar region 06/25/2021   Tremor     Past Surgical History:  Procedure Laterality Date   CORONARY STENT PLACEMENT  02/25/2014   LEFT HEART CATHETERIZATION WITH CORONARY ANGIOGRAM N/A 02/25/2014   Procedure: LEFT HEART CATHETERIZATION WITH CORONARY ANGIOGRAM;  Surgeon: Victory LELON Claudene DOUGLAS, MD;  [facility] CATH LAB;  Service: Cardiovascular;  Laterality: N/A;   PERCUTANEOUS CORONARY STENT INTERVENTION (PCI-S)  02/25/2014   Procedure: PERCUTANEOUS CORONARY STENT INTERVENTION (PCI-S);  Surgeon: Victory LELON Claudene DOUGLAS, MD;  Location: [facility] CATH LAB;  Service: Cardiovascular;;   TONSILLECTOMY      Home Medications:  Allergies as of 10/07/2023   No Known Allergies      Medication List        Accurate as of October 03, 2023  9:46 AM. If you have any questions, ask your nurse or doctor.          albuterol  (2.5 MG/3ML) 0.083% nebulizer solution Commonly known as: PROVENTIL  Take 3 mLs (2.5 mg total) by nebulization every 6 (six) hours as needed for wheezing or shortness of breath. Use one  vial in nebulizer every four to six hours as needed for cough or wheeze.   albuterol  108 (90 Base) MCG/ACT inhaler Commonly known as: VENTOLIN  HFA Inhale 2 puffs into the lungs every 6 (six) hours as needed for wheezing or shortness of breath.   alfuzosin  10 MG 24 hr tablet Commonly known as: UROXATRAL  TAKE 1 TABLET EVERY DAY   aspirin  EC 81 MG tablet Commonly known as: Aspirin  Low Dose Take 1 tablet (81 mg total) by mouth daily.   atorvastatin  20 MG tablet Commonly known as: LIPITOR  Take 1 tablet (20 mg total) by mouth daily.   azelastine  0.1 % nasal spray Commonly known as:  ASTELIN  Place 1 spray into both nostrils 2 (two) times daily as needed for rhinitis. Use in each nostril as directed   budesonide  180 MCG/ACT inhaler Commonly known as: PULMICORT  Inhale 2 puffs into the lungs 2 (two) times daily as needed (shortness of breath). Rinse, gargle, and spit after use.   buPROPion  150 MG 24 hr tablet Commonly known as: WELLBUTRIN  XL Take 1 tablet by mouth daily.   cetirizine  10 MG tablet Commonly known as: ZYRTEC  Take 1 tablet (10 mg total) by mouth daily.   CoQ-10 150 MG Caps Take 150 mg by mouth daily.   cyclobenzaprine  5 MG tablet Commonly known as: FLEXERIL  Take by mouth.   EPINEPHrine  0.3 mg/0.3 mL Soaj injection Commonly known as: EPI-PEN Use for life-threatening allergic reactions   Flovent  HFA 110 MCG/ACT inhaler Generic drug: fluticasone  Inhale 2 puffs into the lungs 2 (two) times daily as needed (with illness or asthma flare up).   fluticasone  44 MCG/ACT inhaler Commonly known as: FLOVENT  HFA Inhale 2 puffs into the lungs every 4 (four) hours as needed.   fluticasone  50 MCG/ACT nasal spray Commonly known as: FLONASE  1-2 sprays each nostril 1-7 times per week   melatonin 5 MG Tabs Take 5 mg by mouth at bedtime as needed (sleep).   memantine  5 MG tablet Commonly known as: NAMENDA  Take 1 tablet (5 mg total) by mouth daily.   nitroGLYCERIN  0.4 MG SL tablet Commonly known as: NITROSTAT  Place 1 tablet (0.4 mg total) under the tongue as needed for chest pain.   omalizumab  150 MG/ML prefilled syringe Commonly known as: Xolair  Inject 300 mg into the skin every 28 (twenty-eight) days.        Allergies: No Known Allergies  Family History  Problem Relation Age of Onset   Colon cancer Mother    Memory loss Mother    Heart attack Father    Hypertension Father    Heart disease Father    Diabetes Father    Diverticulosis Sister    Stroke Paternal Grandmother    Stroke Paternal Grandfather    Heart disease Maternal Aunt     Schizophrenia Maternal Aunt     Social History:  reports that he has never smoked. He has never used smokeless tobacco. He reports current alcohol use of about 3.0 standard drinks of alcohol per week. He reports that he does not use drugs.  ROS: A complete review of systems was performed.  All systems are negative except for pertinent findings as noted.  Physical Exam:  Vital signs in last 24 hours: There were no vitals taken for this visit. Constitutional:  Alert and oriented, No acute distress Cardiovascular: Regular rate  Respiratory: Normal respiratory effort GI: No inguinal hernias Genitourinary: Normal anal sphincter tone.  Prostate 80 mL, slightly soft, not really tender.  No nodularity. Neurologic: Grossly intact,  no focal deficits Psychiatric: Normal mood and affect  I have reviewed prior pt notes  I have reviewed urinalysis results--white blood cells and bacteria present  I have independently reviewed prior imaging--prostate ultrasound  I have reviewed prior PSA and pathology results     Impression/Assessment:  Elevated PSA with atypia seen on biopsy in 2020.  Recent bump up to 13 but patient may well have prostatitis  BPH with symptoms, still somewhat interested in UroLift  Plan:  Prostate massage performed today, urine following that sent for culture  I will put him on 3 weeks of an antibiotic  I will see back in 6 weeks following PSA, discussed UroLift at that time

## 2023-10-07 ENCOUNTER — Ambulatory Visit: Payer: Medicare PPO | Admitting: Urology

## 2023-10-07 VITALS — BP 135/82 | HR 73

## 2023-10-07 DIAGNOSIS — R972 Elevated prostate specific antigen [PSA]: Secondary | ICD-10-CM

## 2023-10-07 DIAGNOSIS — N410 Acute prostatitis: Secondary | ICD-10-CM

## 2023-10-07 DIAGNOSIS — N401 Enlarged prostate with lower urinary tract symptoms: Secondary | ICD-10-CM | POA: Diagnosis not present

## 2023-10-07 LAB — URINALYSIS, ROUTINE W REFLEX MICROSCOPIC
Bilirubin, UA: NEGATIVE
Glucose, UA: NEGATIVE
Ketones, UA: NEGATIVE
Nitrite, UA: NEGATIVE
Protein,UA: NEGATIVE
RBC, UA: NEGATIVE
Specific Gravity, UA: 1.02 (ref 1.005–1.030)
Urobilinogen, Ur: 0.2 mg/dL (ref 0.2–1.0)
pH, UA: 6 (ref 5.0–7.5)

## 2023-10-07 LAB — MICROSCOPIC EXAMINATION

## 2023-10-07 MED ORDER — SULFAMETHOXAZOLE-TRIMETHOPRIM 800-160 MG PO TABS: 1.0000 | ORAL_TABLET | Freq: Two times a day (BID) | ORAL | 0 refills | Status: DC

## 2023-10-09 LAB — URINE CULTURE

## 2023-10-10 ENCOUNTER — Other Ambulatory Visit: Payer: Medicare PPO

## 2023-10-13 ENCOUNTER — Encounter: Payer: Self-pay | Admitting: Urology

## 2023-10-13 ENCOUNTER — Encounter: Payer: Self-pay | Admitting: Physician Assistant

## 2023-10-21 ENCOUNTER — Ambulatory Visit: Payer: Medicare PPO | Admitting: Urology

## 2023-10-21 ENCOUNTER — Ambulatory Visit: Payer: Medicare PPO | Admitting: Physician Assistant

## 2023-10-29 ENCOUNTER — Ambulatory Visit: Payer: Medicare PPO | Admitting: Physician Assistant

## 2023-12-01 NOTE — Progress Notes (Signed)
 History of Present Illness:   12.08.2020: His presenting PSA, checked on 6.17.2014 (prior to his first visit) was 5.2. By history is PSA 2-3 years prior was 1.8, and his PSA in 2013 was 2.5.  His PSA was rechecked in August, 2014 and was 3.78. It was rechecked in November/2014 and was stable at 3.84. He was felt to have a moderately enlarged prostate, judged to be about 60 g by DRE, at the time of his first visit.   11.3.2020: PSA 7.4   12.8.2020: TRUS/Bx. prostate volume 52 mL.  1/12 cores--right apex medial--revealed small focus of atypia.   PSA:    10.09.2018: 4.6 10.15.2019: 4.8 11.03.2020: 7.4 8.31.2021: 3.5 8.30.2022: 4.2.   8.15.2023: PSA 4.7  8.13.2024: PSA 5.7  4.1.2025: He is here today for recheck.  At his visit a couple months ago his PSA was 12.9, drawn while he was being treated for acute prostatitis.  Overall, voiding fairly well now although still on alfuzosin.  He would like to consider minimally invasive therapy.  No recent prostate volume performed.  Past Medical History:  Diagnosis Date   Acquired scoliosis    Age-related nuclear cataract, bilateral    Allergic reaction to alpha-gal 12/31/2021   Allergy to meat 09/12/2015   Arthritis, degenerative 07/06/2002   Asthma    Atypical mole 2014   moderate left chest   CAD (coronary artery disease)    a. 01/2014 Inf STEMI/PCI: LM nl, LAD 50p/m, LCX 30-47m, OM1 50, OM2/3/4 nl, RCA (anomalous) 100p (3.5x23 Xience Alpine DES), EF 60;    Chest pain 10/19/2014   Chronic idiopathic urticaria 12/31/2021   Dry eye syndrome of bilateral lacrimal glands    Elevated PSA    Family history of malignant neoplasm of gastrointestinal tract 07/06/2002   Foot-drop    Hyperlipidemia, unspecified 07/06/2002   IMO Load 2018 R1.1   Hypertension    Ischemic cardiomyopathy    ab. 01/2014 Echo: EF 45-50%, inferoseptal/inf sev HK, Gr 1 DD, nl valves.   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   Pain in  finger of left hand 08/29/2016   Perennial allergic rhinitis 12/31/2021   Presence of stent in right coronary artery 08/08/2016   Sensorineural hearing loss, bilateral    Skin lesion    Spinal stenosis of lumbar region    Spondylolisthesis, lumbar region 06/25/2021   Tremor     Past Surgical History:  Procedure Laterality Date   CORONARY STENT PLACEMENT  02/25/2014   LEFT HEART CATHETERIZATION WITH CORONARY ANGIOGRAM N/A 02/25/2014   Procedure: LEFT HEART CATHETERIZATION WITH CORONARY ANGIOGRAM;  Surgeon: Lesleigh Noe, MD;  Location: [facility] CATH LAB;  Service: Cardiovascular;  Laterality: N/A;   PERCUTANEOUS CORONARY STENT INTERVENTION (PCI-S)  02/25/2014   Procedure: PERCUTANEOUS CORONARY STENT INTERVENTION (PCI-S);  Surgeon: Lesleigh Noe, MD;  Location: [facility] CATH LAB;  Service: Cardiovascular;;   TONSILLECTOMY      Home Medications:  Allergies as of 12/02/2023   No Known Allergies      Medication List        Accurate as of December 01, 2023  6:47 AM. If you have any questions, ask your nurse or doctor.          albuterol (2.5 MG/3ML) 0.083% nebulizer solution Commonly known as: PROVENTIL Take 3 mLs (2.5 mg total) by nebulization every 6 (six) hours as needed for wheezing or shortness of breath. Use one vial in nebulizer every four to six hours as needed  for cough or wheeze.   albuterol 108 (90 Base) MCG/ACT inhaler Commonly known as: VENTOLIN HFA Inhale 2 puffs into the lungs every 6 (six) hours as needed for wheezing or shortness of breath.   alfuzosin 10 MG 24 hr tablet Commonly known as: UROXATRAL TAKE 1 TABLET EVERY DAY   aspirin EC 81 MG tablet Commonly known as: Aspirin Low Dose Take 1 tablet (81 mg total) by mouth daily.   atorvastatin 20 MG tablet Commonly known as: LIPITOR Take 1 tablet (20 mg total) by mouth daily.   azelastine 0.1 % nasal spray Commonly known as: ASTELIN Place 1 spray into both nostrils 2 (two) times daily as needed for rhinitis.  Use in each nostril as directed   budesonide 180 MCG/ACT inhaler Commonly known as: PULMICORT Inhale 2 puffs into the lungs 2 (two) times daily as needed (shortness of breath). Rinse, gargle, and spit after use.   buPROPion 150 MG 24 hr tablet Commonly known as: WELLBUTRIN XL Take 1 tablet by mouth daily.   cetirizine 10 MG tablet Commonly known as: ZYRTEC Take 1 tablet (10 mg total) by mouth daily.   CoQ-10 150 MG Caps Take 150 mg by mouth daily.   cyclobenzaprine 5 MG tablet Commonly known as: FLEXERIL Take by mouth.   EPINEPHrine 0.3 mg/0.3 mL Soaj injection Commonly known as: EPI-PEN Use for life-threatening allergic reactions   Flovent HFA 110 MCG/ACT inhaler Generic drug: fluticasone Inhale 2 puffs into the lungs 2 (two) times daily as needed (with illness or asthma flare up).   fluticasone 44 MCG/ACT inhaler Commonly known as: FLOVENT HFA Inhale 2 puffs into the lungs every 4 (four) hours as needed.   fluticasone 50 MCG/ACT nasal spray Commonly known as: FLONASE 1-2 sprays each nostril 1-7 times per week   melatonin 5 MG Tabs Take 5 mg by mouth at bedtime as needed (sleep).   memantine 5 MG tablet Commonly known as: NAMENDA Take 1 tablet (5 mg total) by mouth daily.   nitroGLYCERIN 0.4 MG SL tablet Commonly known as: NITROSTAT Place 1 tablet (0.4 mg total) under the tongue as needed for chest pain.   omalizumab 150 MG/ML prefilled syringe Commonly known as: Xolair Inject 300 mg into the skin every 28 (twenty-eight) days.   sulfamethoxazole-trimethoprim 800-160 MG tablet Commonly known as: BACTRIM DS Take 1 tablet by mouth 2 (two) times daily.        Allergies: No Known Allergies  Family History  Problem Relation Age of Onset   Colon cancer Mother    Memory loss Mother    Heart attack Father    Hypertension Father    Heart disease Father    Diabetes Father    Diverticulosis Sister    Stroke Paternal Grandmother    Stroke Paternal  Grandfather    Heart disease Maternal Aunt    Schizophrenia Maternal Aunt     Social History:  reports that he has never smoked. He has never used smokeless tobacco. He reports current alcohol use of about 3.0 standard drinks of alcohol per week. He reports that he does not use drugs.  ROS: A complete review of systems was performed.  All systems are negative except for pertinent findings as noted.  Physical Exam:  Vital signs in last 24 hours: There were no vitals taken for this visit. Constitutional:  Alert and oriented, No acute distress Cardiovascular: Regular rate  Respiratory: Normal respiratory effort GI: No inguinal hernias Genitourinary: Normal anal sphincter tone.  Prostate 80 mL, slightly  soft, not really tender.  No nodularity. Neurologic: Grossly intact, no focal deficits Psychiatric: Normal mood and affect  I have reviewed prior pt notes  I have reviewed urinalysis results--white blood cells and bacteria present  I have independently reviewed prior imaging--prostate ultrasound  I have reviewed prior PSA and pathology results  Cystoscopy Procedure Note:  Indication: Voiding symptoms  After informed consent and discussion of the procedure and its risks, VANE YAPP was positioned and prepped in the standard fashion.  Cystoscopy was performed with a flexible cystoscope.   Findings: Urethra: Without stricture or lesion Prostate: Bilobar hypertrophy with obstruction.  No median lobe Bladder neck: Open Ureteral orifices: Normal bilaterally Bladder: Mild trabeculations.  No urothelial lesions.  The patient tolerated the procedure well.      Impression/Assessment:  Elevated PSA with atypia seen on biopsy in 2020.  Recent bump up to 13 but patient may well have prostatitis  BPH with symptoms, bilobar hypertrophy on cystoscopy  Plan:  PSA was rechecked today  I will send him out for prostate ultrasound for volume only  Once that is done, I will call  him about possibility of UroLift versus other alternative.

## 2023-12-02 ENCOUNTER — Ambulatory Visit (INDEPENDENT_AMBULATORY_CARE_PROVIDER_SITE_OTHER): Payer: Medicare PPO | Admitting: Urology

## 2023-12-02 ENCOUNTER — Encounter: Payer: Self-pay | Admitting: Urology

## 2023-12-02 VITALS — BP 143/78 | HR 69

## 2023-12-02 DIAGNOSIS — R39198 Other difficulties with micturition: Secondary | ICD-10-CM

## 2023-12-02 DIAGNOSIS — R972 Elevated prostate specific antigen [PSA]: Secondary | ICD-10-CM

## 2023-12-02 DIAGNOSIS — N400 Enlarged prostate without lower urinary tract symptoms: Secondary | ICD-10-CM | POA: Diagnosis not present

## 2023-12-02 DIAGNOSIS — N410 Acute prostatitis: Secondary | ICD-10-CM

## 2023-12-02 LAB — URINALYSIS, ROUTINE W REFLEX MICROSCOPIC
Bilirubin, UA: NEGATIVE
Glucose, UA: NEGATIVE
Ketones, UA: NEGATIVE
Leukocytes,UA: NEGATIVE
Nitrite, UA: NEGATIVE
Protein,UA: NEGATIVE
RBC, UA: NEGATIVE
Specific Gravity, UA: 1.03 (ref 1.005–1.030)
Urobilinogen, Ur: 1 mg/dL (ref 0.2–1.0)
pH, UA: 6 (ref 5.0–7.5)

## 2023-12-02 MED ORDER — CIPROFLOXACIN HCL 500 MG PO TABS
500.0000 mg | ORAL_TABLET | Freq: Once | ORAL | Status: AC
Start: 2023-12-02 — End: 2023-12-02
  Administered 2023-12-02: 500 mg via ORAL

## 2023-12-03 LAB — MICROSCOPIC EXAMINATION
Casts: NONE SEEN /LPF
Epithelial Cells (non renal): >10 /HPF — AB (ref 0–10)
RBC, Urine: >30 /HPF — AB (ref 0–2)
WBC, UA: >30 /HPF — AB (ref 0–5)

## 2023-12-03 LAB — URINALYSIS, ROUTINE W REFLEX MICROSCOPIC
Bilirubin, UA: NEGATIVE
Glucose, UA: NEGATIVE
Ketones, UA: NEGATIVE
Nitrite, UA: NEGATIVE
Specific Gravity, UA: 1.028 (ref 1.005–1.030)
Urobilinogen, Ur: 1 mg/dL (ref 0.2–1.0)
pH, UA: 5.5 (ref 5.0–7.5)

## 2023-12-03 LAB — PSA: Prostate Specific Ag, Serum: 5.7 ng/mL — ABNORMAL HIGH (ref 0.0–4.0)

## 2023-12-05 ENCOUNTER — Ambulatory Visit (INDEPENDENT_AMBULATORY_CARE_PROVIDER_SITE_OTHER): Payer: Medicare PPO | Admitting: Physician Assistant

## 2023-12-05 ENCOUNTER — Encounter: Payer: Self-pay | Admitting: Urology

## 2023-12-05 ENCOUNTER — Encounter: Payer: Self-pay | Admitting: Physician Assistant

## 2023-12-05 VITALS — BP 164/80 | HR 82 | Resp 20 | Ht 68.0 in | Wt 173.0 lb

## 2023-12-05 DIAGNOSIS — R4189 Other symptoms and signs involving cognitive functions and awareness: Secondary | ICD-10-CM

## 2023-12-05 MED ORDER — MEMANTINE HCL 5 MG PO TABS
5.0000 mg | ORAL_TABLET | Freq: Every day | ORAL | 3 refills | Status: AC
Start: 2023-12-05 — End: ?

## 2023-12-05 NOTE — Progress Notes (Signed)
 Assessment/Plan:   Memory Impairment   Eric Cortez is a delightful 73 y.o. RH male with a history off asthma, anxiety, hearing loss presenting today in follow-up for evaluation of subjective memory loss with a component of vascular disease neurocognitive evaluation presenting today in follow-up for evaluation of memory loss. Patient is on memantine 5 mg nightly.  He is stable, with MMSE of 29/30.  His mood is well-controlled, and he is able to participate to his ADLs and drive without difficulty.     Recommendations:   Follow up in 6 month continue memantine 5 mg nightly Replenish B12 Repeat neuropsych evaluation if functional and cognitive decline is noted Recommend good control of cardiovascular risk factors Continue to control mood as per PCP, continue good stress management    Subjective:   This patient is accompanied in the office by his wife  who supplements the history. Previous records as well as any outside records available were reviewed prior to todays visit.   Patient was last seen on 04/02/2023 with MoCA 27/30     Any changes in memory since last visit? " Memory about the same, unless tired".  As before, she has some difficulty remembering recent conversations on the information, names but not worse than prior.  He likes to work around the house.  He plays the piano, prefers classical music, play  golf.  He likes sudoku and solitaire repeats oneself?  Endorsed Disoriented when walking into a room?  Patient denies    Misplacing objects?  Patient denies   Wandering behavior?   denies   Any personality changes since last visit?   Denies.   Any worsening depression?: denies.  He is trying to work on his anxiety, "I am doing much better ". Hallucinations or paranoia?  Denies.   Seizures?   denies    Any sleep changes? Sleeps well. Denies vivid dreams, REM behavior or sleepwalking   Sleep apnea?   Denies.    Any hygiene concerns?   Denies.   Independent of bathing  and dressing?  Endorsed  Does the patient needs help with medications? Patient is in charge   Who is in charge of the finances?  Patient is in charge     Any changes in appetite?  Denies.     Patient have trouble swallowing?  denies   Does the patient cook? Occasionally he may have barbecue, but does not use the stove indoors. Any kitchen accidents such as leaving the stove on?   denies   Any headaches?    Denies.   Vision changes? Denies. Chronic pain?  denies   Ambulates with difficulty?  He walks daily without any difficulties   Recent falls or head injuries?    Denies.      Unilateral weakness, numbness or tingling?   Denies.   Any tremors?  Denies.   Any anosmia?    Denies.   Any incontinence of urine?  He has a history of BPH with urge incontinence, recent prostatitis, getting a urolift, follows urology Any bowel dysfunction?  Denies.      Patient lives with his wife     Does the patient drive?  Yes, denies issues    Initial Visit 11/29/21 The patient is seen in neurologic consultation at the request of Tressie Stalker, MD for the evaluation of memory.  The patient is accompanied by  who supplements the history. This is a 73 y.o. year old RH  male who has had memory issues for about  2 years, although he says that he is memory "always has not trouble, all my life, I am hard of hearing is a test I cannot understand ".  Currently, he is not using his hearing aids.  His wife states that he does repeat himself, and forgets recent conversations.  He never was diagnosed with ADD or ADHD, although his wife does suspect that he has it.  He denies being disoriented when walking into her room or moving objects in unusual places, however he "loses things constantly, and is always trying to find staff ".  He ambulates without difficulty, recently, he has been under the care of his neurosurgeon, for different areas of chronic pain, and spondylolisthesis, and he denies any worsening pain at this time,  he is able to walk well, without any falls.  He does not need a cane or a walker to ambulate.  He denies any broad-based gait.  At age 3, he had a head injury when falling from a tree, which left the right parietal frontal area scar.  He also has being hit by a baseball when he was younger "cry on top of my head and he left the soft spot ".  He denies having had meningitis or encephalitis as a child, with a difficult birth.  He drives, and his wife states that sometimes he is still taking the shortcut, he takes the loan card, "he does not listen to me ".  This frustrates her.  He lives with her, and his mood is overall stable, denies any depression or irritability.  His sleep varies, he has occasional vivid dreams.  His wife states that he does not do sleepwalking, or REM behavior, however he has had moments in which she thought that he was having a seizure, he had jerky movements and possible restless leg syndrome.  His doctor placed him on a muscle relaxer, which has helped him.  He denies any history of seizures.  He denies any hallucinations or paranoia, there are no hygiene concerns, he is independent of bathing and dressing.  His medications are in a pillbox, he denies missing any doses.  He is in charge of his finances.  His appetite is good, denies trouble swallowing, he does have mild increase in production of saliva.  He does not cook.  He denies any headaches, double vision, dizziness, focal numbness or tingling, unilateral weakness, or anosmia.  He has a history of mild intention tremors for the last 7 or 8 months.  He says that at times if he is left hand is weak, he gets muscle jerks, he drops objects.  He denies micrographia, he denies any voice changes.  He also reports muscle cramps over the last 6 months, "off and on and worse over the last month ".  He still able to do his activities of daily living. He denies any urine incontinence, retention, constipation or diarrhea.  Denies any history of  sleep apnea, alcohol or tobacco.  He denies any history of melanoma.  Family history negative for dementia or Parkinson's disease.     EEG 12/2021 was normal  No Known Allergies     Neuropsychological evaluation a 3/23, Dr. Milbert Coulter briefly, results suggested an isolated impairment surrounding phonemic fluency. However, all other aspects of expressive language were appropriate. Performances were also appropriate relative to age-matched peers across all other assessed cognitive domains. 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 Subjective day-to-day dysfunction may also be influenced by uncorrected hearing loss, ongoing anxiety, and, as reported by his wife, the fact that they have been under a "tremendous amount of stress" lately. This could certainly increase the risk for inefficient learning of new information and the experience of attentional and memory lapses, especially with heightened stress or when trying to multi-task.    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   Past Medical History:  Diagnosis Date   Acquired scoliosis    Age-related nuclear cataract, bilateral    Allergic reaction to alpha-gal 12/31/2021   Allergy to meat 09/12/2015   Arthritis, degenerative 07/06/2002   Asthma    Atypical mole 2014   moderate left chest   CAD (coronary artery disease)    a. 01/2014 Inf STEMI/PCI: LM nl, LAD 50p/m, LCX 30-31m, OM1 50, OM2/3/4 nl, RCA (anomalous) 100p (3.5x23 Xience Alpine DES), EF 60;    Chest pain 10/19/2014   Chronic idiopathic urticaria 12/31/2021   Dry eye syndrome of bilateral lacrimal glands    Elevated PSA    Family history of malignant neoplasm of gastrointestinal tract 07/06/2002    Foot-drop    Hyperlipidemia, unspecified 07/06/2002   IMO Load 2018 R1.1   Hypertension    Ischemic cardiomyopathy    ab. 01/2014 Echo: EF 45-50%, inferoseptal/inf sev HK, Gr 1 DD, nl valves.   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   Pain in finger of left hand 08/29/2016   Perennial allergic rhinitis 12/31/2021   Presence of stent in right coronary artery 08/08/2016   Sensorineural hearing loss, bilateral    Skin lesion    Spinal stenosis of lumbar region    Spondylolisthesis, lumbar region 06/25/2021   Tremor      Past Surgical History:  Procedure Laterality Date   CORONARY STENT PLACEMENT  02/25/2014   LEFT HEART CATHETERIZATION WITH CORONARY ANGIOGRAM N/A 02/25/2014   Procedure: LEFT HEART CATHETERIZATION WITH CORONARY ANGIOGRAM;  Surgeon: Lesleigh Noe, MD;  Location: [facility] CATH LAB;  Service: Cardiovascular;  Laterality: N/A;   PERCUTANEOUS CORONARY STENT INTERVENTION (PCI-S)  02/25/2014   Procedure: PERCUTANEOUS CORONARY STENT INTERVENTION (PCI-S);  Surgeon: Lesleigh Noe, MD;  Location: [facility] CATH LAB;  Service: Cardiovascular;;   TONSILLECTOMY       PREVIOUS MEDICATIONS:   CURRENT MEDICATIONS:  Outpatient Encounter Medications as of 12/05/2023  Medication Sig   albuterol (VENTOLIN HFA) 108 (90 Base) MCG/ACT inhaler Inhale 2 puffs into the lungs every 6 (six) hours as needed for wheezing or shortness of breath.   alfuzosin (UROXATRAL) 10 MG 24 hr tablet TAKE 1 TABLET EVERY DAY   aspirin EC (ASPIRIN LOW DOSE) 81 MG tablet Take 1 tablet (81 mg total) by mouth daily.   atorvastatin (LIPITOR) 20 MG tablet Take 1 tablet (20 mg total) by mouth daily.   azelastine (ASTELIN) 0.1 % nasal spray Place 1 spray into both nostrils 2 (two) times daily as needed for rhinitis. Use in each nostril as directed   budesonide (PULMICORT) 180 MCG/ACT inhaler Inhale 2 puffs into the lungs 2 (two) times daily as needed (shortness of breath). Rinse, gargle, and  spit after use.   buPROPion (WELLBUTRIN XL) 150 MG 24 hr tablet Take 1 tablet by mouth daily.   cetirizine (ZYRTEC) 10 MG tablet Take 1 tablet (10 mg total) by mouth daily.  Coenzyme Q10 (COQ-10) 150 MG CAPS Take 150 mg by mouth daily.   cyclobenzaprine (FLEXERIL) 5 MG tablet Take by mouth.   EPINEPHrine 0.3 mg/0.3 mL IJ SOAJ injection Use for life-threatening allergic reactions   FLOVENT HFA 110 MCG/ACT inhaler Inhale 2 puffs into the lungs 2 (two) times daily as needed (with illness or asthma flare up).   fluticasone (FLONASE) 50 MCG/ACT nasal spray 1-2 sprays each nostril 1-7 times per week   fluticasone (FLOVENT HFA) 44 MCG/ACT inhaler Inhale 2 puffs into the lungs every 4 (four) hours as needed.   melatonin 5 MG TABS Take 5 mg by mouth at bedtime as needed (sleep).   omalizumab Geoffry Paradise) 150 MG/ML prefilled syringe Inject 300 mg into the skin every 28 (twenty-eight) days.   sulfamethoxazole-trimethoprim (BACTRIM DS) 800-160 MG tablet Take 1 tablet by mouth 2 (two) times daily.   [DISCONTINUED] memantine (NAMENDA) 5 MG tablet Take 1 tablet (5 mg total) by mouth daily.   albuterol (PROVENTIL) (2.5 MG/3ML) 0.083% nebulizer solution Take 3 mLs (2.5 mg total) by nebulization every 6 (six) hours as needed for wheezing or shortness of breath. Use one vial in nebulizer every four to six hours as needed for cough or wheeze.   memantine (NAMENDA) 5 MG tablet Take 1 tablet (5 mg total) by mouth daily.   nitroGLYCERIN (NITROSTAT) 0.4 MG SL tablet Place 1 tablet (0.4 mg total) under the tongue as needed for chest pain. (Patient not taking: Reported on 12/05/2023)   Facility-Administered Encounter Medications as of 12/05/2023  Medication   omalizumab Geoffry Paradise) injection 300 mg     Objective:     PHYSICAL EXAMINATION:    VITALS:   Vitals:   12/05/23 0836  BP: (!) 164/80  Pulse: 82  Resp: 20  SpO2: 99%  Weight: 173 lb (78.5 kg)  Height: 5\' 8"  (1.727 m)    GEN:  The patient appears stated age  and is in NAD. HEENT:  Normocephalic, atraumatic.   Neurological examination:  General: NAD, well-groomed, appears stated age. Orientation: The patient is alert. Oriented to person, place and date Cranial nerves: There is good facial symmetry.The speech is fluent and clear. No aphasia or dysarthria. Fund of knowledge is appropriate. Recent memory impaired and remote memory is normal.  Attention and concentration are normal.  Able to name objects and repeat phrases.  Hearing is intact to conversational tone .   Delayed recall 2/3 Sensation: Sensation is intact to Josey touch throughout Motor: Strength is at least antigravity x4. DTR's 2/4 in UE/LE      04/16/2023    8:00 AM 04/02/2023    1:00 PM 11/29/2021    3:00 PM  Montreal Cognitive Assessment   Visuospatial/ Executive (0/5) 4 4 4   Naming (0/3) 3 3 3   Attention: Read list of digits (0/2) 1 1 2   Attention: Read list of letters (0/1) 1 1 1   Attention: Serial 7 subtraction starting at 100 (0/3) 3 3 3   Language: Repeat phrase (0/2) 2 2 1   Language : Fluency (0/1) 1 1 0  Abstraction (0/2) 2 2 1   Delayed Recall (0/5) 3 3 1   Orientation (0/6) 6 6 6   Total 26 26 22   Adjusted Score (based on education) 27 26 22        12/05/2023   12:00 PM  MMSE - Mini Mental State Exam  Orientation to time 5  Orientation to Place 5  Registration 3  Attention/ Calculation 5  Recall 2  Language- name 2 objects 2  Language- repeat 1  Language- follow 3 step command 3  Language- read & follow direction 1  Write a sentence 1  Copy design 1  Total score 29       Movement examination: Tone: There is normal tone in the UE/LE Abnormal movements:  no tremor.  No myoclonus.  No asterixis.   Coordination:  There is no decremation with RAM's. Normal finger to nose  Gait and Station: The patient has no difficulty arising out of a deep-seated chair without the use of the hands. The patient's stride length is good.  Gait is cautious and narrow.   Thank  you for allowing [place] the opportunity to participate in the care of this nice patient. Please do not hesitate to contact us for any questions or concerns.   Total time spent on today's visit was 24 minutes dedicated to this patient today, preparing to see patient, examining the patient, ordering tests and/or medications and counseling the patient, documenting clinical information in the EHR or other health record, independently interpreting results and communicating results to the patient/family, discussing treatment and goals, answering patient's questions and coordinating care.  Cc:  Juliette Alcide, MD  Marlowe Kays 12/05/2023 12:34 PM

## 2023-12-05 NOTE — Patient Instructions (Addendum)
 It was a pleasure to see you today at our office.   Recommendations:   Continue Memantine 5 mg tablets daily.   Continue B12  Follow up in  6 month   Whom to call:  Memory  decline, memory medications: Call our office [phone]   For psychiatric meds, mood meds: Please have your primary care physician manage these medications.    For assessment of decision of mental capacity and competency:  Call Dr. Erick Blinks, geriatric psychiatrist at [phone]  For guidance in geriatric dementia issues please call [facility]    If you have any severe symptoms of a stroke, or other severe issues such as confusion,severe chills or fever, etc call 911 or go to the ER as you may need to be evaluated further       RECOMMENDATIONS FOR ALL PATIENTS WITH MEMORY PROBLEMS: 1. Continue to exercise (Recommend 30 minutes of walking everyday, or 3 hours every week) 2. Increase social interactions - continue going to [place] and enjoy social gatherings with friends and family 3. Eat healthy, avoid fried foods and eat more fruits and vegetables 4. Maintain adequate blood pressure, blood sugar, and blood cholesterol level. Reducing the risk of stroke and cardiovascular disease also helps promoting better memory. 5. Avoid stressful situations. Live a simple life and avoid aggravations. Organize your time and prepare for the next day in anticipation. 6. Sleep well, avoid any interruptions of sleep and avoid any distractions in the bedroom that may interfere with adequate sleep quality 7. Avoid sugar, avoid sweets as there is a strong link between excessive sugar intake, diabetes, and cognitive impairment We discussed the Mediterranean diet, which has been shown to help patients reduce the risk of progressive memory disorders and reduces cardiovascular risk. This includes eating fish, eat fruits and green leafy vegetables, nuts like almonds and hazelnuts, walnuts, and also use olive oil. Avoid  fast foods and fried foods as much as possible. Avoid sweets and sugar as sugar use has been linked to worsening of memory function.  There is always a concern of gradual progression of memory problems. If this is the case, then we may need to adjust level of care according to patient needs. Support, both to the patient and caregiver, should then be put into place.    FALL PRECAUTIONS: Be cautious when walking. Scan the area for obstacles that may increase the risk of trips and falls. When getting up in the mornings, sit up at the edge of the bed for a few minutes before getting out of bed. Consider elevating the bed at the head end to avoid drop of blood pressure when getting up. Walk always in a well-lit room (use night lights in the walls). Avoid area rugs or power cords from appliances in the middle of the walkways. Use a walker or a cane if necessary and consider physical therapy for balance exercise. Get your eyesight checked regularly.  FINANCIAL OVERSIGHT: Supervision, especially oversight when making financial decisions or transactions is also recommended.  HOME SAFETY: Consider the safety of the kitchen when operating appliances like stoves, microwave oven, and blender. Consider having supervision and share cooking responsibilities until no longer able to participate in those. Accidents with firearms and other hazards in the house should be identified and addressed as well.   ABILITY TO BE LEFT ALONE: If patient is unable to contact 911 operator, consider using LifeLine, or when the need is there, arrange for someone to stay with patients. Smoking is a  fire hazard, consider supervision or cessation. Risk of wandering should be assessed by caregiver and if detected at any point, supervision and safe proof recommendations should be instituted.  MEDICATION SUPERVISION: Inability to self-administer medication needs to be constantly addressed. Implement a mechanism to ensure safe administration of  the medications.   DRIVING: Regarding driving, in patients with progressive memory problems, driving will be impaired. We advise to have someone else do the driving if trouble finding directions or if minor accidents are reported. Independent driving assessment is available to determine safety of driving.   If you are interested in the driving assessment, you can contact the following:  [employer] in [place] [phone]  [facility] [phone]  [facility] [phone]  [facility] [phone] or [phone]

## 2023-12-15 ENCOUNTER — Ambulatory Visit (HOSPITAL_COMMUNITY)
Admission: RE | Admit: 2023-12-15 | Discharge: 2023-12-15 | Disposition: A | Discharge status: Home / Self Care | Source: Ambulatory Visit | Attending: Urology | Admitting: Urology

## 2023-12-15 DIAGNOSIS — R972 Elevated prostate specific antigen [PSA]: Secondary | ICD-10-CM | POA: Insufficient documentation

## 2023-12-31 ENCOUNTER — Telehealth: Payer: Self-pay

## 2023-12-31 NOTE — Telephone Encounter (Signed)
 Pt called and left us  a message that he wants his US  results and MD Dahlstedts recommendations

## 2024-01-06 ENCOUNTER — Other Ambulatory Visit: Payer: Self-pay | Admitting: Urology

## 2024-01-06 DIAGNOSIS — N400 Enlarged prostate without lower urinary tract symptoms: Secondary | ICD-10-CM

## 2024-01-20 ENCOUNTER — Ambulatory Visit (INDEPENDENT_AMBULATORY_CARE_PROVIDER_SITE_OTHER): Admitting: Urology

## 2024-01-20 ENCOUNTER — Encounter: Payer: Self-pay | Admitting: Urology

## 2024-01-20 VITALS — BP 123/66 | Ht 68.0 in | Wt 168.0 lb

## 2024-01-20 DIAGNOSIS — N401 Enlarged prostate with lower urinary tract symptoms: Secondary | ICD-10-CM | POA: Diagnosis not present

## 2024-01-20 DIAGNOSIS — N138 Other obstructive and reflux uropathy: Secondary | ICD-10-CM | POA: Diagnosis not present

## 2024-01-20 NOTE — Patient Instructions (Signed)

## 2024-01-20 NOTE — Progress Notes (Signed)
 01/20/24 10:07 AM   Eric Cortez March 23, 1951 [id number]  CC: BPH, elevated PSA, history of prostatitis  HPI: 73 year old male referred from Dr. Joie Narrow for consideration of HOLEP.  He lives in [place] Eric Cortez  almost 2 hours away.  He has a long history of BPH and mildly elevated PSA, had a negative prostate biopsy in 2020.  Most recent PSA stable at 5.7, decreased from 12.9 at time of acute prostatitis.  He has been on alfuzosin  long-term.  Primary urinary complaints are weak stream, difficulty initiating urination, and urinary frequency.  Prostate measured 75 g on recent transrectal ultrasound, cystoscopy showed obstructive lateral lobes but no other abnormalities.  He is interested in definitive treatment options.   PMH: Past Medical History:  Diagnosis Date   Acquired scoliosis    Age-related nuclear cataract, bilateral    Allergic reaction to alpha-gal 12/31/2021   Allergy to meat 09/12/2015   Arthritis, degenerative 07/06/2002   Asthma    Atypical mole 2014   moderate left chest   CAD (coronary artery disease)    a. 01/2014 Inf STEMI/PCI: LM nl, LAD 50p/m, LCX 30-37m, OM1 50, OM2/3/4 nl, RCA (anomalous) 100p (3.5x23 Xience Alpine DES), EF 60;    Chest pain 10/19/2014   Chronic idiopathic urticaria 12/31/2021   Dry eye syndrome of bilateral lacrimal glands    Elevated PSA    Family history of malignant neoplasm of gastrointestinal tract 07/06/2002   Foot-drop    Hyperlipidemia, unspecified 07/06/2002   IMO Load 2018 R1.1   Hypertension    Ischemic cardiomyopathy    ab. 01/2014 Echo: EF 45-50%, inferoseptal/inf sev HK, Gr 1 DD, nl valves.   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   Pain in finger of left hand 08/29/2016   Perennial allergic rhinitis 12/31/2021   Presence of stent in right coronary artery 08/08/2016   Sensorineural hearing loss, bilateral    Skin lesion    Spinal stenosis of lumbar region    Spondylolisthesis, lumbar  region 06/25/2021   Tremor     Surgical History: Past Surgical History:  Procedure Laterality Date   CORONARY STENT PLACEMENT  02/25/2014   LEFT HEART CATHETERIZATION WITH CORONARY ANGIOGRAM N/A 02/25/2014   Procedure: LEFT HEART CATHETERIZATION WITH CORONARY ANGIOGRAM;  Surgeon: Mickiel Albany, MD;  Location: [facility] CATH LAB;  Service: Cardiovascular;  Laterality: N/A;   PERCUTANEOUS CORONARY STENT INTERVENTION (PCI-S)  02/25/2014   Procedure: PERCUTANEOUS CORONARY STENT INTERVENTION (PCI-S);  Surgeon: Mickiel Albany, MD;  Location: [facility] CATH LAB;  Service: Cardiovascular;;   TONSILLECTOMY        Family History: Family History  Problem Relation Age of Onset   Colon cancer Mother    Memory loss Mother    Heart attack Father    Hypertension Father    Heart disease Father    Diabetes Father    Diverticulosis Sister    Stroke Paternal Grandmother    Stroke Paternal Grandfather    Heart disease Maternal Aunt    Schizophrenia Maternal Aunt     Social History:  reports that he has never smoked. He has never used smokeless tobacco. He reports current alcohol use of about 3.0 standard drinks of alcohol per week. He reports that he does not use drugs.  Physical Exam: BP 123/66   Ht 5\' 8"  (1.727 m)   Wt 168 lb (76.2 kg)   BMI 25.54 kg/m    Constitutional:  Alert and oriented, No acute distress. Cardiovascular: No  clubbing, cyanosis, or edema. Respiratory: Normal respiratory effort, no increased work of breathing. GI: Abdomen is soft, nontender, nondistended, no abdominal masses   Laboratory Data: Reviewed, see HPI  Pertinent Imaging: I have personally viewed and interpreted the transrectal ultrasound dated 12/15/2023 showing a 76 g prostate.  Assessment & Plan:   73 year old male with BPH and obstructive urinary symptoms despite alfuzosin , history of prostatitis, interested in more definitive treatments.  Cystoscopy showed obstructive appearing prostate with a volume of 76  g by Dr. Joie Narrow, referred to discuss HOLEP.  We discussed the risks and benefits of HoLEP at length.  The procedure requires general anesthesia and takes 1 to 2 hours, and a holmium laser is used to enucleate the prostate and push this tissue into the bladder.  A morcellator is then used to remove this tissue, which is sent for pathology.  The vast majority(>95%) of patients are able to discharge the same day with a catheter in place for 2 to 3 days, and will follow-up in clinic for a voiding trial.  We specifically discussed the risks of bleeding, infection, retrograde ejaculation, temporary urgency and urge incontinence, very low risk of long-term incontinence, urethral stricture/bladder neck contracture, pathologic evaluation of prostate tissue and possible detection of prostate cancer or other malignancy, and possible need for additional procedures.  Schedule HOLEP, patient prefers September Can give preop urinalysis and culture locally with [facility] in [facility] Eric Cortez    Jay Meth, MD 01/20/2024  [facility] [address] [place], [place] [address] [phone]

## 2024-01-27 ENCOUNTER — Other Ambulatory Visit: Payer: Self-pay

## 2024-01-27 DIAGNOSIS — N401 Enlarged prostate with lower urinary tract symptoms: Secondary | ICD-10-CM

## 2024-01-27 NOTE — Progress Notes (Unsigned)
 Surgical Physician Order Form [facility]  Dr. Jay Meth, MD  * Scheduling expectation : Patient prefers September (OR day will be Monday at that point)  *Length of Case: 2 hours  *Clearance needed: no  *Anticoagulation Instructions: Hold all anticoagulants  *Aspirin  Instructions: Hold Aspirin   *Post-op visit Date/Instructions:  1-3 day cath removal  *Diagnosis: BPH w/urinary obstruction  *Procedure:  HOLEP ([address])   Additional orders: N/A  -Admit type: OUTpatient  -Anesthesia: General  -VTE Prophylaxis Standing Order SCD's       Other:   -Standing Lab Orders Per Anesthesia    Lab other: UA&Urine Culture-> can be done locally with [facility] in [name]  if possible  -Standing Test orders EKG/Chest x-ray per Anesthesia       Test other:   - Medications:  Ancef  2gm IV  -Other orders:  N/A

## 2024-01-29 ENCOUNTER — Telehealth: Payer: Self-pay

## 2024-01-29 NOTE — Telephone Encounter (Signed)
 Per Dr. Estanislao Heimlich, Patient is to be scheduled for  Holmium Laser Enucleation of the Prostate   Eric Cortez was contacted and possible surgical dates were discussed, Monday September 15th, 2025 was agreed upon for surgery.   Patient was instructed that Dr. Estanislao Heimlich will require them to provide a pre-op UA & CX prior to surgery. This was ordered and scheduled drop off appointment was made for 05/07/2024.    Patient was directed to call [phone] between 1-3pm the day before surgery to find out surgical arrival time.  Instructions were given not to eat or drink from midnight on the night before surgery and have a driver for the day of surgery. On the surgery day patient was instructed to enter through the Medical Mall entrance of [facility] report the Same Day Surgery desk.   Pre-Admit Testing will be in contact via phone to set up an interview with the anesthesia team to review your history and medications prior to surgery.   Reminder of this information was sent via MyChart to the patient.

## 2024-01-29 NOTE — Progress Notes (Signed)
   [facility] Surgical Posting Form  Surgery Date: Date: 05/17/2024  Surgeon: Dr. Jay Meth, MD  Inpt ( No  )   Outpt (Yes)   Obs ( No  )   Diagnosis: N40.1, N13.8 Benign Prostatic Hyperplasia with Urinary Obstruction  -CPT: 24401  Surgery: Holmium Laser Enucleation of the Prostate   Stop Anticoagulations: Yes and also hold ASA  Cardiac/Medical/Pulmonary Clearance needed: no  *Orders entered into EPIC  Date: 01/29/24   *Case booked in [place]  Date: 01/27/2024  *Notified pt of Surgery: Date: 01/27/2024  PRE-OP UA & CX: Yes, will obtain in [name]   *Placed into Prior Authorization Work Que Date: 01/29/24  Assistant/laser/rep:No

## 2024-04-05 ENCOUNTER — Encounter: Payer: Self-pay | Admitting: Urology

## 2024-04-15 ENCOUNTER — Encounter: Payer: Self-pay | Admitting: Urology

## 2024-04-23 ENCOUNTER — Other Ambulatory Visit: Payer: Self-pay

## 2024-04-23 DIAGNOSIS — N138 Other obstructive and reflux uropathy: Secondary | ICD-10-CM

## 2024-04-30 ENCOUNTER — Telehealth: Payer: Self-pay | Admitting: Physician Assistant

## 2024-04-30 NOTE — Telephone Encounter (Signed)
 Pt wife would like to speak to someone about increasing his  memantine    They would like [facility] called in to [facility] in [place]

## 2024-04-30 NOTE — Telephone Encounter (Signed)
 Per patient wife the patient she feels his memory is getting worse. He has had a lot of stress lately.    Advised patient wife [name] is out of the office right now.   We will send a message back to advised and give you a call back.  Per Wife if they are not going to increase the medication we can wait until [name] returns to see what she says.

## 2024-05-01 LAB — MICROSCOPIC EXAMINATION
Bacteria, UA: NONE SEEN
Casts: NONE SEEN /LPF
Epithelial Cells (non renal): NONE SEEN /HPF (ref 0–10)
WBC, UA: NONE SEEN /HPF (ref 0–5)

## 2024-05-01 LAB — URINALYSIS, COMPLETE
Bilirubin, UA: NEGATIVE
Glucose, UA: NEGATIVE
Ketones, UA: NEGATIVE
Nitrite, UA: NEGATIVE
Protein,UA: NEGATIVE
RBC, UA: NEGATIVE
Specific Gravity, UA: 1.017 (ref 1.005–1.030)
Urobilinogen, Ur: 1 mg/dL (ref 0.2–1.0)
pH, UA: 5.5 (ref 5.0–7.5)

## 2024-05-01 LAB — CULTURE, URINE COMPREHENSIVE

## 2024-05-10 ENCOUNTER — Encounter
Admission: RE | Admit: 2024-05-10 | Discharge: 2024-05-10 | Disposition: A | Discharge status: Home / Self Care | Source: Ambulatory Visit | Attending: Urology

## 2024-05-10 ENCOUNTER — Other Ambulatory Visit: Payer: Self-pay

## 2024-05-10 VITALS — Ht 68.0 in | Wt 170.0 lb

## 2024-05-10 DIAGNOSIS — I10 Essential (primary) hypertension: Secondary | ICD-10-CM

## 2024-05-10 DIAGNOSIS — I2510 Atherosclerotic heart disease of native coronary artery without angina pectoris: Secondary | ICD-10-CM

## 2024-05-10 DIAGNOSIS — Z01810 Encounter for preprocedural cardiovascular examination: Secondary | ICD-10-CM

## 2024-05-10 DIAGNOSIS — Z01812 Encounter for preprocedural laboratory examination: Secondary | ICD-10-CM

## 2024-05-10 NOTE — Patient Instructions (Addendum)
 Your procedure is scheduled nw:FNWIJB SEPTEMBER 15  Report to the Registration Desk on the 1st floor of the Medical Mall. To find out your arrival time, please call [phone] between 1PM - 3PM on:  FRIDAY SEPTEMBER 12 If your arrival time is 6:00 am, do not arrive before that time as the Medical Mall entrance doors do not open until 6:00 am.  REMEMBER: Instructions that are not followed completely may result in serious medical risk, up to and including death; or upon the discretion of your surgeon and anesthesiologist your surgery may need to be rescheduled.  Do not eat food after midnight the night before surgery.  No gum chewing or hard candies.  One week prior to surgery: MONDAY SEPTEMBER 8  Stop Anti-inflammatories (NSAIDS) such as Advil, Aleve, Ibuprofen, Motrin, Naproxen, Naprosyn and Aspirin  based products such as Excedrin, Goody's Powder, BC Powder. Stop ANY OVER THE COUNTER supplements until after surgery. Cyanocobalamin (VITAMIN B-12 PO)  fluticasone  (FLONASE )  melatonin   Aspirin  per Sninsky's office hold until after surgery  You may however, continue to take Tylenol  if needed for pain up until the day of surgery.  Continue taking all of your other prescription medications up until the day of surgery.  ON THE DAY OF SURGERY DO NOT TAKE ANY MEDICATIONS   Use inhalers on the day of surgery and bring to the hospital. albuterol  (VENTOLIN  HFA )   No Alcohol for 24 hours before or after surgery.  Do not use any recreational drugs for at least a week (preferably 2 weeks) before your surgery.  Please be advised that the combination of cocaine and anesthesia may have negative outcomes, up to and including death. If you test positive for cocaine, your surgery will be cancelled.  On the morning of surgery brush your teeth with toothpaste and water, you may rinse your mouth with mouthwash if you wish. Do not swallow any toothpaste or mouthwash.  Do not wear jewelry,  make-up, hairpins, clips or nail polish.  For welded (permanent) jewelry: bracelets, anklets, waist bands, etc.  Please have this removed prior to surgery.  If it is not removed, there is a chance that hospital personnel will need to cut it off on the day of surgery.  Do not wear lotions, powders, or perfumes.   Do not shave body hair from the neck down 48 hours before surgery.  Contact lenses, hearing aids and dentures may not be worn into surgery.  Do not bring valuables to the hospital. [facility] is not responsible for any missing/lost belongings or valuables.   Notify your doctor if there is any change in your medical condition (cold, fever, infection).  Wear comfortable clothing (specific to your surgery type) to the hospital.  After surgery, you can help prevent lung complications by doing breathing exercises.  Take deep breaths and cough every 1-2 hours.    If you are being discharged the day of surgery, you will not be allowed to drive home. You will need a responsible individual to drive you home and stay with you for 24 hours after surgery.   If you are taking public transportation, you will need to have a responsible individual with you.  Please call the Pre-admissions Testing Dept. at [phone] if you have any questions about these instructions.  Surgery Visitation Policy:  Patients having surgery or a procedure may have two visitors.  Children under the age of 14 must have an adult with them who is not the patient.  Community  Resource Directory to address health-related social needs:  https://[place].Proor.no

## 2024-05-11 ENCOUNTER — Telehealth: Payer: Self-pay

## 2024-05-11 ENCOUNTER — Telehealth: Payer: Self-pay | Admitting: *Deleted

## 2024-05-11 NOTE — Telephone Encounter (Signed)
   Name: Eric Cortez  DOB: 04/12/1951  MRN: [id number]  Primary Cardiologist: Stanly DELENA Leavens, MD   Preoperative team, please contact this patient and set up a phone call appointment for further preoperative risk assessment. Please obtain consent and complete medication review. Thank you for your help.  I confirm that guidance regarding antiplatelet and oral anticoagulation therapy has been completed and, if necessary, noted below.  Regarding ASA therapy, we recommend continuation of ASA throughout the perioperative period. However, if the surgeon feels that cessation of ASA is required in the perioperative period, it may be stopped 5-7 days prior to surgery with a plan to resume it as soon as felt to be feasible from a surgical standpoint in the post-operative period.    I also confirmed the patient resides in the state of [place] . As per [facility] Medical Board telemedicine laws, the patient must reside in the state in which the provider is licensed.   Eric CHRISTELLA Beauvais, NP 05/11/2024, 11:32 AM [facility]

## 2024-05-11 NOTE — Telephone Encounter (Signed)
Patient has been scheduled for televisit.

## 2024-05-11 NOTE — Telephone Encounter (Signed)
-----   Message from Dorise CHARLENA Pereyra sent at 05/11/2024 10:09 AM EDT ----- Regarding: Request for pre-operative cardiac clearance Request for pre-operative cardiac clearance:  1. What type of surgery is being performed?  ENUCLEATION, PROSTATE, USING LASER, WITH MORCELLATION  2. When is this surgery scheduled?  05/17/2024  3. Type of clearance being requested (medical, pharmacy, both)? MEDICAL   4. Are there any medications that need to be held prior to surgery? ASA  5. Practice name and name of physician performing surgery?  Performing surgeon: Dr. Redell Burnet, MD Requesting clearance: Dorise Pereyra, FNP-C    6. Anesthesia type (none, local, MAC, general)? GENERAL  7. What is the office phone and fax number?   Phone: [phone] Fax: [phone] (In efforts to conserve resources (paper), return FAX not required. I will follow up in [facility]).  ATTENTION: Unable to create telephone message as per your standard workflow. Directed by [facility] providers to send requests for cardiac clearance to this pool for appropriate distribution to provider covering pre-operative clearances.   Dorise Pereyra, MSN, APRN, FNP-C, CEN [facility]  Peri-operative Services Nurse Practitioner Phone: [phone] 05/11/24 10:09 AM

## 2024-05-11 NOTE — Telephone Encounter (Signed)
   Pre-operative Risk Assessment    Patient Name: Eric Cortez  DOB: [birth year] MRN: [id number]   Date of last office visit: 06/05/23 DR. CHANDRASEKHAR Date of next office visit: NONE    Request for Surgical Clearance    Procedure:  ENUCLEATION, PROSTATE, USING LASER, WITH MORCELLATION  Date of Surgery:  Clearance 05/17/24                                Surgeon:  DR. REDELL BURNET Surgeon's Group or Practice Name:  [facility] Phone number:  [phone] Fax number:  [phone]   Type of Clearance Requested:   - Medical  - Pharmacy:  Hold Aspirin      Type of Anesthesia:  General    Additional requests/questions:    Bonney Niels Jest   05/11/2024, 10:25 AM

## 2024-05-11 NOTE — Telephone Encounter (Signed)
 Patient has been scheduled for televisit med rec and consent done     Patient Consent for Virtual Visit         Eric Cortez has provided verbal consent on 05/11/2024 for a virtual visit (video or telephone).   CONSENT FOR VIRTUAL VISIT FOR:  Eric Cortez  By participating in this virtual visit I agree to the following:  I hereby voluntarily request, consent and authorize [facility] and its employed or contracted physicians, physician assistants, nurse practitioners or other licensed health care professionals (the Practitioner), to provide me with telemedicine health care services (the "Services) as deemed necessary by the treating Practitioner. I acknowledge and consent to receive the Services by the Practitioner via telemedicine. I understand that the telemedicine visit will involve communicating with the Practitioner through live audiovisual communication technology and the disclosure of certain medical information by electronic transmission. I acknowledge that I have been given the opportunity to request an in-person assessment or other available alternative prior to the telemedicine visit and am voluntarily participating in the telemedicine visit.  I understand that I have the right to withhold or withdraw my consent to the use of telemedicine in the course of my care at any time, without affecting my right to future care or treatment, and that the Practitioner or I may terminate the telemedicine visit at any time. I understand that I have the right to inspect all information obtained and/or recorded in the course of the telemedicine visit and may receive copies of available information for a reasonable fee.  I understand that some of the potential risks of receiving the Services via telemedicine include:  Delay or interruption in medical evaluation due to technological equipment failure or disruption; Information transmitted may not be sufficient (e.g. poor resolution of images)  to allow for appropriate medical decision making by the Practitioner; and/or  In rare instances, security protocols could fail, causing a breach of personal health information.  Furthermore, I acknowledge that it is my responsibility to provide information about my medical history, conditions and care that is complete and accurate to the best of my ability. I acknowledge that Practitioner's advice, recommendations, and/or decision may be based on factors not within their control, such as incomplete or inaccurate data provided by me or distortions of diagnostic images or specimens that may result from electronic transmissions. I understand that the practice of medicine is not an exact science and that Practitioner makes no warranties or guarantees regarding treatment outcomes. I acknowledge that a copy of this consent can be made available to me via my patient portal [facility] MyChart), or I can request a printed copy by calling the office of [facility].    I understand that my insurance will be billed for this visit.   I have read or had this consent read to me. I understand the contents of this consent, which adequately explains the benefits and risks of the Services being provided via telemedicine.  I have been provided ample opportunity to ask questions regarding this consent and the Services and have had my questions answered to my satisfaction. I give my informed consent for the services to be provided through the use of telemedicine in my medical care

## 2024-05-14 ENCOUNTER — Encounter: Payer: Self-pay | Admitting: Urology

## 2024-05-14 ENCOUNTER — Ambulatory Visit: Attending: Cardiology

## 2024-05-14 DIAGNOSIS — Z01810 Encounter for preprocedural cardiovascular examination: Secondary | ICD-10-CM | POA: Diagnosis not present

## 2024-05-14 NOTE — Progress Notes (Addendum)
 Perioperative / Anesthesia Services  Pre-Admission Testing Clinical Review / Pre-Operative Anesthesia Consult  Date: 05/14/24  PATIENT DEMOGRAPHICS: Name: Eric Cortez DOB: June 27, 1951 MRN:   [id number]  Note: Available PAT nursing documentation and vital signs have been reviewed. Clinical nursing staff has updated patient's PMH/PSHx, current medication list, and drug allergies/intolerances to ensure complete and comprehensive history available to assist care teams in MDM as it pertains to the aforementioned surgical procedure and anticipated anesthetic course. Extensive review of available clinical information personally performed. [place] [facility] and PSHx updated with any diagnoses/procedures that  may have been inadvertently omitted during his intake with the pre-admission testing department's nursing staff.  PLANNED SURGICAL PROCEDURE(S):   Case: [id number] Date/Time: 05/17/24 0730   Procedure: ENUCLEATION, PROSTATE, USING LASER, WITH MORCELLATION   Anesthesia type: General   Diagnosis: Benign prostatic hyperplasia with urinary obstruction [N40.1, N13.8]   Pre-op diagnosis: Benign Prostatic Hyperplasia with Urinary Obstruction   Location: [facility] OR ROOM 10 / [facility]   Surgeons: Francisca Redell BROCKS, MD        CLINICAL DISCUSSION: Eric Cortez is a 73 y.o. male who is submitted for pre-surgical anesthesia review and clearance prior to him undergoing the above procedure. Patient has never been a smoker in the past. Pertinent PMH includes: CAD, inferior STEMI, ischemic cardiomyopathy, chronic cerebral microvascular disease, aortic atherosclerosis, angina, HTN, HLD, asthma, BPH with BOO, alpha gal syndrome/meat allergy, OA, acquired scoliosis, lumbar spondylolisthesis with spinal stenosis, tremor, memory loss.  Patient is followed by cardiology [name], MD). He was last seen in the cardiology clinic on 06/05/2023; notes reviewed. At the time of his clinic visit,  patient doing well overall from a cardiovascular perspective. Patient denied any chest pain, shortness of breath, PND, orthopnea, palpitations, significant peripheral edema, weakness, fatigue, vertiginous symptoms, or presyncope/syncope. Patient with a past medical history significant for cardiovascular diagnoses. Documented physical exam was grossly benign, providing no evidence of acute exacerbation and/or decompensation of the patient's known cardiovascular conditions.  Patient suffered an inferior wall STEMI in 01/2014.  Troponins peaked at >20.  He underwent diagnostic LEFT heart catheterization revealing multivessel CAD; 50% proximal-mid LAD, 30-40% LCx, 50% OM1, and 100% proximal RCA.  He subsequently underwent PCI placing a 3.5 x 23 mm Xience Alpine DES x 1 to the proximal RCA.  Procedure yielded excellent angiographic result and TIMI-3 flow.  Myocardial perfusion imaging study performed on 10/21/2014 revealing a normal left ventricular systolic function.  There was no evidence of stress-induced myocardial ischemia or arrhythmia; no scintigraphic evidence of scar.  Study determined to be low risk.  Most recent TTE performed on 06/25/2023 revealed a normal left ventricular systolic function with a hyperdynamic LVEF of 65 to 70%. There were no regional wall motion abnormalities. Left ventricular diastolic Doppler parameters consistent with abnormal relaxation (G1DD). GLS -21.9%. Right ventricular size and function normal with a TAPSE measuring 1.8 cm  (normal range >/= 1.6 cm).  RVSP = 20.6 mmHg.  There was trivial mitral and tricuspid valve regurgitation.  All transvalvular gradients were noted to be normal providing no evidence of hemodynamically significant valvular stenosis. Aorta normal in size with no evidence of ectasia or aneurysmal dilatation.  Blood pressure reasonably controlled at 138/78 mmHg; takes no antihypertensive medications. Patient is on atorvastatin  for his HLD diagnosis and ASCVD  prevention. Patient is not diabetic. He does not have an OSAH diagnosis. Patient is able to complete all of his  ADL/IADLs without cardiovascular limitation.  Per the DASI, patient  is able to achieve at least 4 METS of physical activity without experiencing any significant degree of angina/anginal equivalent symptoms. No changes were made to his medication regimen during his visit with cardiology.  Patient scheduled to follow-up with outpatient cardiology in 12 months or sooner if needed.  Eric Cortez is scheduled for an elective ENUCLEATION, PROSTATE, USING LASER, WITH MORCELLATION on 05/17/2024 with Dr. Redell JAYSON Burnet, MD. Given patient's past medical history significant for cardiovascular diagnoses, presurgical cardiac clearance was sought by the PAT team.  Per cardiology, Mr. Holloran's perioperative risk of a major cardiac event is 0.9% according to the Revised Cardiac Risk Index (RCRI).  Therefore, he is at low risk for perioperative complications.   His functional capacity is good at 6.36 METs according to the [facility] Activity Status Index (DASI). According to ACC/AHA guidelines, no further cardiovascular testing needed.  The patient may proceed to surgery at ACCEPTABLE risk.   In review of the patient's chart, it is noted that he is on daily oral antithrombotic therapy.  He has been instructed on recommendations for holding his daily low-dose ASA for 7 days prior to his procedure with plans to restart as soon as postoperative bleeding risk felt to be minimized by his primary attending surgeon. The patient has been instructed that his last dose should be on 05/09/2024.  Patient denies previous perioperative complications with anesthesia in the past. In review his EMR, it is noted that patient underwent a general anesthetic course at [facility] ([facility]) in 06/2021 without documented complications.   MOST RECENT VITAL SIGNS:    05/10/2024    3:53 PM 01/20/2024    9:48 AM 12/05/2023    8:36 AM   Vitals with BMI  Height 5' 8 5' 8 5' 8  Weight 170 lbs 168 lbs 173 lbs  BMI 25.85 25.55 26.31  Systolic  123 164  Diastolic  66 80  Pulse   82   PROVIDERS/SPECIALISTS: NOTE: Primary physician provider listed below. Patient may have been seen by APP or partner within same practice.   PROVIDER ROLE / SPECIALTY LAST SHERLEAN Burnet Redell JAYSON, MD Urology (Surgeon) 01/20/2024  Lari Elspeth BRAVO, MD Primary Care Provider ???  Santo Kelly, MD  Cardiology 06/05/2023; preop APP call 05/14/2024  Dina Credit, PA-C  Neurology 12/05/2023   ALLERGIES: Allergies  Allergen Reactions   Alpha-Gal     CURRENT HOME MEDICATIONS: No current facility-administered medications for this encounter.    albuterol  (VENTOLIN  HFA) 108 (90 Base) MCG/ACT inhaler   alfuzosin  (UROXATRAL ) 10 MG 24 hr tablet   aspirin  EC (ASPIRIN  LOW DOSE) 81 MG tablet   atorvastatin  (LIPITOR ) 20 MG tablet   Cyanocobalamin (VITAMIN B-12 PO)   EPINEPHrine  0.3 mg/0.3 mL IJ SOAJ injection   fluticasone  (FLONASE ) 50 MCG/ACT nasal spray   memantine  (NAMENDA ) 5 MG tablet   nitroGLYCERIN  (NITROSTAT ) 0.4 MG SL tablet   melatonin 5 MG TABS   HISTORY: Past Medical History:  Diagnosis Date   Acquired scoliosis    Acute ST elevation myocardial infarction (STEMI) of inferior wall (HCC) 02/25/2014   a.) troponin peaked at > 20.0 ng/mL; b.) LHC/PCI 02/25/2014: 100% pRCA (3.5 x 23 mm Xience Alpine DES)   Age-related nuclear cataract, bilateral    Allergic reaction to alpha-gal 12/31/2021   Allergy to meat 09/12/2015   Aortic atherosclerosis (HCC)    Arthritis, degenerative 07/06/2002   Asthma    Atypical mole 2014   moderate left chest   BPH with urinary  obstruction    CAD (coronary artery disease)    a. 01/2014 Inf STEMI/PCI: LM nl, LAD 50p/m, LCX 30-88m, OM1 50, OM2/3/4 nl, RCA (anomalous) 100p (3.5x23 Xience Alpine DES), EF 60;    Cerebral microvascular disease    Chest pain 10/19/2014   Chronic idiopathic urticaria  12/31/2021   Dry eye syndrome of bilateral lacrimal glands    Elevated PSA    Family history of malignant neoplasm of gastrointestinal tract 07/06/2002   Foot-drop    HLD (hyperlipidemia)    Hypertension    Ischemic cardiomyopathy    a.) 01/2014 Echo: EF 45-50%, inferoseptal/inf sev HK, Gr 1 DD, nl valves.   Long-term use of aspirin  therapy    Lumbago 07/06/2002   Memory loss    a.) on NMDA receptor antagonist (memantine )   Pain in finger of left hand 08/29/2016   Perennial allergic rhinitis 12/31/2021   Sensorineural hearing loss, bilateral    Spinal stenosis of lumbar region    Spondylolisthesis, lumbar region 06/25/2021   a.) s/p L2-L5 PLIF 06/25/2021   Tremor    Past Surgical History:  Procedure Laterality Date   CORONARY STENT PLACEMENT  02/25/2014   LEFT HEART CATHETERIZATION WITH CORONARY ANGIOGRAM N/A 02/25/2014   Procedure: LEFT HEART CATHETERIZATION WITH CORONARY ANGIOGRAM;  Surgeon: Victory LELON Claudene DOUGLAS, MD;  Location: [facility] CATH LAB;  Service: Cardiovascular;  Laterality: N/A;   PERCUTANEOUS CORONARY STENT INTERVENTION (PCI-S)  02/25/2014   Procedure: PERCUTANEOUS CORONARY STENT INTERVENTION (PCI-S);  Surgeon: Victory LELON Claudene DOUGLAS, MD;  Location: [facility] CATH LAB;  Service: Cardiovascular;;   TONSILLECTOMY     Family History  Problem Relation Age of Onset   Colon cancer Mother    Memory loss Mother    Heart attack Father    Hypertension Father    Heart disease Father    Diabetes Father    Diverticulosis Sister    Stroke Paternal Grandmother    Stroke Paternal Grandfather    Heart disease Maternal Aunt    Schizophrenia Maternal Aunt    Social History   Tobacco Use   Smoking status: Never   Smokeless tobacco: Never  Substance Use Topics   Alcohol use: Yes    Alcohol/week: 3.0 standard drinks of alcohol    Types: 3 Glasses of wine per week    Comment: occassional ETOH   LABS:  Orders Only on 04/23/2024  Component Date Value Ref Range Status   Specific Gravity, UA  04/28/2024 1.017  1.005 - 1.030 Final   pH, UA 04/28/2024 5.5  5.0 - 7.5 Final   Color, UA 04/28/2024 Yellow  Yellow Final   Appearance Ur 04/28/2024 Clear  Clear Final   Leukocytes,UA 04/28/2024 Trace (A)  Negative Final   Protein,UA 04/28/2024 Negative  Negative/Trace Final   Glucose, UA 04/28/2024 Negative  Negative Final   Ketones, UA 04/28/2024 Negative  Negative Final   RBC, UA 04/28/2024 Negative  Negative Final   Bilirubin, UA 04/28/2024 Negative  Negative Final   Urobilinogen, Ur 04/28/2024 1.0  0.2 - 1.0 mg/dL Final   Nitrite, UA 91/72/7974 Negative  Negative Final   Microscopic Examination 04/28/2024 See below:   Final   Microscopic was indicated and was performed.   WBC, UA 04/28/2024 None seen  0 - 5 /hpf Final   RBC, Urine 04/28/2024 0-2  0 - 2 /hpf Final   Epithelial Cells (non renal) 04/28/2024 None seen  0 - 10 /hpf Final   Casts 04/28/2024 None seen  None seen /lpf Final  Bacteria, UA 04/28/2024 None seen  None seen/Few Final   Urine Culture, Comprehensive 04/28/2024 Final report   Final   Organism ID, Bacteria 04/28/2024 Comment   Final   No growth in 36 - 48 hours.    ECG: Date: 06/05/2023  Time ECG obtained: 1048 AM Rate: 61 bpm Rhythm: normal sinus Axis (leads I and aVF): normal Intervals: PR 156 ms. QRS 82 ms. QTc 402 ms. ST segment and T wave changes: No evidence of acute T wave abnormalities or significant ST segment elevation or depression.  Evidence of a possible, age undetermined, prior infarct:  Yes; interior and anterolateral  Comparison: Similar to previous tracing obtained on 06/05/2023   IMAGING / PROCEDURES: TRANSTHORACIC ECHOCARDIOGRAM performed on 06/25/2023 Left ventricular ejection fraction, by estimation, is 65 to 70%. Left ventricular ejection fraction by 3D volume is 70 %. The left ventricle has normal function. The left ventricle has no regional wall motion abnormalities. Left ventricular diastolic parameters are consistent with Grade I  diastolic dysfunction (impaired  relaxation). The average left ventricular global longitudinal strain is -21.9 %. The global longitudinal strain is normal.  Right ventricular systolic function is normal. The right ventricular size is normal. There is normal pulmonary artery systolic pressure. The estimated right ventricular systolic pressure is 20.6 mmHg. The mitral valve is abnormal. Trivial mitral valve regurgitation.  The aortic valve is tricuspid. Aortic valve regurgitation is not visualized. Aortic valve sclerosis is present, with no evidence of aortic valve stenosis.  The inferior vena cava is normal in size with greater than 50% respiratory variability, suggesting right atrial pressure of 3 mmHg.   MR BRAIN WO CONTRAST performed on 12/20/2021 No evidence of acute intracranial abnormality. Moderate-to-advanced chronic small vessel ischemic changes within the cerebral white matter. Mild chronic small vessel ischemic changes are also present within the pons and right middle cerebellar peduncle. No age advanced or lobar predominant parenchymal atrophy. Mild mucosal thickening within the bilateral ethmoid, sphenoid and maxillary sinuses. Superimposed small mucous retention cyst within the right maxillary sinus.  CT LUMBAR SPINE W CONTRAST performed on 01/18/2021 Transitional lumbosacral anatomy. Severe multifactorial spinal stenosis at L2-3 and moderate spinal stenosis at L3-4. Moderate right and mild left neural foraminal stenosis at L1-2 and L3-4. Moderate right and severe left neural foraminal stenosis at L4-5. Aortic atherosclerosis  IMPRESSION AND PLAN: LEONE PUTMAN has been referred for pre-anesthesia review and clearance prior to him undergoing the planned anesthetic and procedural courses. Available labs, pertinent testing, and imaging results were personally reviewed by me in preparation for upcoming operative/procedural course. [facility] medical record has been updated following  extensive record review and patient interview with PAT staff.   This patient has been appropriately cleared by cardiology with an overall ACCEPTABLE risk of patient experiencing significant perioperative cardiovascular complications. Based on clinical review performed today (05/14/24), barring any significant acute changes in the patient's overall condition, it is anticipated that he will be able to proceed with the planned surgical intervention. Any acute changes in clinical condition may necessitate his procedure being postponed and/or cancelled. Patient will meet with anesthesia team (MD and/or CRNA) on the day of his procedure for preoperative evaluation/assessment. Questions regarding anesthetic course will be fielded at that time.   Pre-surgical instructions were reviewed with the patient during his PAT appointment, and questions were fielded to satisfaction by PAT clinical staff. He has been instructed on which medications that he will need to hold prior to surgery, as well as the ones that have been  deemed safe/appropriate to take on the day of his procedure. As part of the general education provided by PAT, patient made aware both verbally and in writing, that he would need to abstain from the use of any illegal substances during his perioperative course. He was advised that failure to follow the provided instructions could necessitate case cancellation or result in serious perioperative complications up to and including death. Patient encouraged to contact PAT and/or his surgeon's office to discuss any questions or concerns that may arise prior to surgery; verbalized understanding.   Dorise Pereyra, MSN, APRN, FNP-C, CEN [facility]  Perioperative Services Nurse Practitioner Phone: [phone] Fax: [phone] 05/14/24 12:46 PM  NOTE: This note has been prepared using Dragon dictation software. Despite my best ability to proofread, there is always the potential that  unintentional transcriptional errors may still occur from this process.

## 2024-05-14 NOTE — Progress Notes (Signed)
 Virtual Visit via Telephone Note   Because of Eric Cortez co-morbid illnesses, he is at least at moderate risk for complications without adequate follow up.  This format is felt to be most appropriate for this patient at this time.  Due to technical limitations with video connection (technology), today's appointment will be conducted as an audio only telehealth visit, and Eric Cortez verbally agreed to proceed in this manner.   All issues noted in this document were discussed and addressed.  No physical exam could be performed with this format.  Evaluation Performed:  Preoperative cardiovascular risk assessment _____________   Date:  05/14/2024   Patient ID:  Eric Cortez, DOB 07/29/1951, MRN [id number] Patient Location:  Home Provider location:   Office  Primary Care Provider:  Lari Elspeth BRAVO, MD Primary Cardiologist:  Stanly DELENA Leavens, MD  Chief Complaint / Patient Profile   73 y.o. y/o male with a h/o prior STEMI in 2015 with associated decreased LVEF, hyperlipidemia who is pending enucleation of the prostate using a laser with morcellation and presents today for telephonic preoperative cardiovascular risk assessment.  History of Present Illness    Eric Cortez is a 73 y.o. male who presents via audio/video conferencing for a telehealth visit today.  Pt was last seen in cardiology clinic on 06/05/23 by Dr. Leavens.  At that time Eric Cortez was doing well.  The patient is now pending procedure as outlined above. Since his last visit, he has been doing well without chest pain or SOB. He plays golf and just played yesterday. He mows about three acres.  For this reason he does surpass 4 METS on the DASI.  Regarding ASA therapy, we recommend continuation of ASA throughout the perioperative period. However, if the surgeon feels that cessation of ASA is required in the perioperative period, it may be stopped 5-7 days prior to surgery with a plan to resume it as soon  as felt to be feasible from a surgical standpoint in the post-operative period.   Past Medical History    Past Medical History:  Diagnosis Date   Acquired scoliosis    Acute ST elevation myocardial infarction (STEMI) of inferior wall (HCC) 02/25/2014   a.) LHC/PCI 02/25/2014: 100% pRCA (3.5 x 23 mm Xience Alpine DES)   Age-related nuclear cataract, bilateral    Allergic reaction to alpha-gal 12/31/2021   Allergy to meat 09/12/2015   Aortic atherosclerosis (HCC)    Arthritis, degenerative 07/06/2002   Asthma    Atypical mole 2014   moderate left chest   BPH with urinary obstruction    CAD (coronary artery disease)    a. 01/2014 Inf STEMI/PCI: LM nl, LAD 50p/m, LCX 30-40m, OM1 50, OM2/3/4 nl, RCA (anomalous) 100p (3.5x23 Xience Alpine DES), EF 60;    Chest pain 10/19/2014   Chronic idiopathic urticaria 12/31/2021   Dry eye syndrome of bilateral lacrimal glands    Elevated PSA    Family history of malignant neoplasm of gastrointestinal tract 07/06/2002   Foot-drop    HLD (hyperlipidemia)    Hypertension    Ischemic cardiomyopathy    ab. 01/2014 Echo: EF 45-50%, inferoseptal/inf sev HK, Gr 1 DD, nl valves.   Long-term use of aspirin  therapy    Lumbago 07/06/2002   Memory loss    a.) on NMDA receptor antagonist (memantine )   Pain in finger of left hand 08/29/2016   Perennial allergic rhinitis 12/31/2021   Sensorineural hearing loss, bilateral    Spinal stenosis  of lumbar region    Spondylolisthesis, lumbar region 06/25/2021   a.) s/p L2-L5 PLIF 06/25/2021   Tremor    Past Surgical History:  Procedure Laterality Date   CORONARY STENT PLACEMENT  02/25/2014   LEFT HEART CATHETERIZATION WITH CORONARY ANGIOGRAM N/A 02/25/2014   Procedure: LEFT HEART CATHETERIZATION WITH CORONARY ANGIOGRAM;  Surgeon: Victory LELON Claudene DOUGLAS, MD;  Location: [facility] CATH LAB;  Service: Cardiovascular;  Laterality: N/A;   PERCUTANEOUS CORONARY STENT INTERVENTION (PCI-S)  02/25/2014   Procedure: PERCUTANEOUS  CORONARY STENT INTERVENTION (PCI-S);  Surgeon: Victory LELON Claudene DOUGLAS, MD;  Location: [facility] CATH LAB;  Service: Cardiovascular;;   TONSILLECTOMY      Allergies  Allergies  Allergen Reactions   Alpha-Gal     Home Medications    Prior to Admission medications   Medication Sig Start Date End Date Taking? Authorizing Provider  albuterol  (VENTOLIN  HFA) 108 (90 Base) MCG/ACT inhaler Inhale 2 puffs into the lungs every 6 (six) hours as needed for wheezing or shortness of breath. 09/09/23   Kozlow, Camellia PARAS, MD  alfuzosin  (UROXATRAL ) 10 MG 24 hr tablet TAKE 1 TABLET EVERY DAY Patient taking differently: Take 10 mg by mouth every evening. 09/09/23   Matilda Senior, MD  aspirin  EC (ASPIRIN  LOW DOSE) 81 MG tablet Take 1 tablet (81 mg total) by mouth daily. Patient taking differently: Take 81 mg by mouth every evening. 06/25/22   Claudene Victory LELON, MD  atorvastatin  (LIPITOR ) 20 MG tablet Take 1 tablet (20 mg total) by mouth daily. Patient taking differently: Take 20 mg by mouth every evening. 08/08/23   Chandrasekhar, Stanly LABOR, MD  Cyanocobalamin (VITAMIN B-12 PO) Take 1 Dose by mouth in the morning.    [provider]  EPINEPHrine  0.3 mg/0.3 mL IJ SOAJ injection Use for life-threatening allergic reactions 07/22/22   Tobie Arleta SQUIBB, MD  fluticasone  (FLONASE ) 50 MCG/ACT nasal spray 1-2 sprays each nostril 1-7 times per week Patient taking differently: Place 1 spray into both nostrils in the morning and at bedtime. 09/09/23   Kozlow, Camellia PARAS, MD  melatonin 5 MG TABS Take 5 mg by mouth at bedtime as needed (sleep).    [provider]  memantine  (NAMENDA ) 5 MG tablet Take 1 tablet (5 mg total) by mouth daily. Patient taking differently: Take 5 mg by mouth every evening. 12/05/23   Wertman, Sara E, PA-C  nitroGLYCERIN  (NITROSTAT ) 0.4 MG SL tablet Place 1 tablet (0.4 mg total) under the tongue as needed for chest pain. Patient taking differently: Place 0.4 mg under the tongue every 5 (five) minutes x 3  doses as needed for chest pain. 08/19/22   Parthenia Olivia HERO, PA-C    Physical Exam    Vital Signs:  Eric Cortez does not have vital signs available for review today.  Given telephonic nature of communication, physical exam is limited. AAOx3. NAD. Normal affect.  Speech and respirations are unlabored.  Accessory Clinical Findings    None  Assessment & Plan    1.  Preoperative Cardiovascular Risk Assessment: 6} Eric Cortez's perioperative risk of a major cardiac event is 0.9% according to the Revised Cardiac Risk Index (RCRI).  Therefore, he is at low risk for perioperative complications.   His functional capacity is good at 6.36 METs according to the [facility] Activity Status Index (DASI). Recommendations: According to ACC/AHA guidelines, no further cardiovascular testing needed.  The patient may proceed to surgery at acceptable risk.   Antiplatelet and/or Anticoagulation Recommendations: Aspirin  can be held for  5-7 days prior to his surgery.  Please resume Aspirin  post operatively when it is felt to be safe from a bleeding standpoint.   The patient was advised that if he develops new symptoms prior to surgery to contact our office to arrange for a follow-up visit, and he verbalized understanding.   A copy of this note will be routed to requesting surgeon.  Time:   Today, I have spent 5 minutes with the patient with telehealth technology discussing medical history, symptoms, and management plan.     Orren LOISE Fabry, PA-C  05/14/2024, 10:13 AM

## 2024-05-16 MED ORDER — CHLORHEXIDINE GLUCONATE 0.12 % MT SOLN
15.0000 mL | Freq: Once | OROMUCOSAL | Status: AC
  Administered 2024-05-17: 15 mL via OROMUCOSAL

## 2024-05-16 MED ORDER — CEFAZOLIN SODIUM-DEXTROSE 2-4 GM/100ML-% IV SOLN
2.0000 g | INTRAVENOUS | Status: AC
  Administered 2024-05-17: 2 g via INTRAVENOUS

## 2024-05-16 MED ORDER — ORAL CARE MOUTH RINSE
15.0000 mL | Freq: Once | OROMUCOSAL | Status: AC
Start: 2024-05-16 — End: 2024-05-17

## 2024-05-16 MED ORDER — LACTATED RINGERS IV SOLN
INTRAVENOUS | Status: DC
Start: 2024-05-16 — End: 2024-05-17

## 2024-05-17 ENCOUNTER — Encounter: Payer: Self-pay | Admitting: Urology

## 2024-05-17 ENCOUNTER — Encounter
Admission: RE | Disposition: A | Discharge status: Home / Self Care | Payer: Self-pay | Source: Home / Self Care | Attending: Urology

## 2024-05-17 ENCOUNTER — Other Ambulatory Visit: Payer: Self-pay

## 2024-05-17 ENCOUNTER — Ambulatory Visit: Payer: Self-pay | Admitting: Urgent Care

## 2024-05-17 ENCOUNTER — Ambulatory Visit
Admission: RE | Admit: 2024-05-17 | Discharge: 2024-05-17 | Disposition: A | Discharge status: Home / Self Care | Attending: Urology | Admitting: Urology

## 2024-05-17 DIAGNOSIS — J45909 Unspecified asthma, uncomplicated: Secondary | ICD-10-CM | POA: Diagnosis not present

## 2024-05-17 DIAGNOSIS — I252 Old myocardial infarction: Secondary | ICD-10-CM | POA: Insufficient documentation

## 2024-05-17 DIAGNOSIS — E785 Hyperlipidemia, unspecified: Secondary | ICD-10-CM | POA: Diagnosis not present

## 2024-05-17 DIAGNOSIS — I10 Essential (primary) hypertension: Secondary | ICD-10-CM | POA: Diagnosis not present

## 2024-05-17 DIAGNOSIS — Z01810 Encounter for preprocedural cardiovascular examination: Secondary | ICD-10-CM

## 2024-05-17 DIAGNOSIS — N401 Enlarged prostate with lower urinary tract symptoms: Secondary | ICD-10-CM | POA: Diagnosis present

## 2024-05-17 DIAGNOSIS — I700 Atherosclerosis of aorta: Secondary | ICD-10-CM | POA: Insufficient documentation

## 2024-05-17 DIAGNOSIS — I2510 Atherosclerotic heart disease of native coronary artery without angina pectoris: Secondary | ICD-10-CM | POA: Diagnosis not present

## 2024-05-17 DIAGNOSIS — Z01812 Encounter for preprocedural laboratory examination: Secondary | ICD-10-CM

## 2024-05-17 DIAGNOSIS — N138 Other obstructive and reflux uropathy: Secondary | ICD-10-CM

## 2024-05-17 DIAGNOSIS — Z955 Presence of coronary angioplasty implant and graft: Secondary | ICD-10-CM | POA: Diagnosis not present

## 2024-05-17 HISTORY — DX: Atherosclerosis of aorta: I70.0

## 2024-05-17 HISTORY — DX: Other cerebrovascular disease: I67.89

## 2024-05-17 HISTORY — PX: HOLEP-LASER ENUCLEATION OF THE PROSTATE WITH MORCELLATION: SHX6641

## 2024-05-17 HISTORY — DX: Other amnesia: R41.3

## 2024-05-17 HISTORY — DX: Other obstructive and reflux uropathy: N13.8

## 2024-05-17 HISTORY — DX: Long term (current) use of aspirin: Z79.82

## 2024-05-17 LAB — BASIC METABOLIC PANEL WITH GFR
Anion gap: 11 (ref 5–15)
BUN: 14 mg/dL (ref 8–23)
CO2: 27 mmol/L (ref 22–32)
Calcium: 9.2 mg/dL (ref 8.9–10.3)
Chloride: 105 mmol/L (ref 98–111)
Creatinine, Ser: 1.24 mg/dL (ref 0.61–1.24)
GFR, Estimated: >60 mL/min (ref >60)
Glucose, Bld: 106 mg/dL — ABNORMAL HIGH (ref 70–99)
Potassium: 3.8 mmol/L (ref 3.5–5.1)
Sodium: 143 mmol/L (ref 135–145)

## 2024-05-17 LAB — CBC
HCT: 50.5 % (ref 39.0–52.0)
Hemoglobin: 16.9 g/dL (ref 13.0–17.0)
MCH: 31 pg (ref 26.0–34.0)
MCHC: 33.5 g/dL (ref 30.0–36.0)
MCV: 92.5 fL (ref 80.0–100.0)
Platelets: 211 K/uL (ref 150–400)
RBC: 5.46 MIL/uL (ref 4.22–5.81)
RDW: 13.2 % (ref 11.5–15.5)
WBC: 6.6 K/uL (ref 4.0–10.5)
nRBC: 0 % (ref 0.0–0.2)

## 2024-05-17 SURGERY — ENUCLEATION, PROSTATE, USING LASER, WITH MORCELLATION
Anesthesia: General

## 2024-05-17 MED ORDER — TRAMADOL HCL 50 MG PO TABS: 25.0000 mg | ORAL_TABLET | Freq: Four times a day (QID) | ORAL | 0 refills | Status: AC | PRN

## 2024-05-17 MED ORDER — LIDOCAINE HCL (PF) 2 % IJ SOLN
INTRAMUSCULAR | Status: AC
  Filled 2024-05-17: qty 5

## 2024-05-17 MED ORDER — DEXAMETHASONE SODIUM PHOSPHATE 10 MG/ML IJ SOLN
INTRAMUSCULAR | Status: DC | PRN
  Administered 2024-05-17: 10 mg via INTRAVENOUS

## 2024-05-17 MED ORDER — FENTANYL CITRATE (PF) 100 MCG/2ML IJ SOLN
INTRAMUSCULAR | Status: DC | PRN
  Administered 2024-05-17: 50 ug via INTRAVENOUS

## 2024-05-17 MED ORDER — OXYCODONE HCL 5 MG PO TABS: 5.0000 mg | ORAL_TABLET | Freq: Once | ORAL | Status: DC | PRN

## 2024-05-17 MED ORDER — ONDANSETRON HCL 4 MG/2ML IJ SOLN
INTRAMUSCULAR | Status: DC | PRN
  Administered 2024-05-17: 4 mg via INTRAVENOUS

## 2024-05-17 MED ORDER — GLYCOPYRROLATE 0.2 MG/ML IJ SOLN
INTRAMUSCULAR | Status: AC
  Filled 2024-05-17: qty 1

## 2024-05-17 MED ORDER — OXYCODONE HCL 5 MG/5ML PO SOLN: 5.0000 mg | Freq: Once | ORAL | Status: DC | PRN

## 2024-05-17 MED ORDER — ROCURONIUM BROMIDE 100 MG/10ML IV SOLN
INTRAVENOUS | Status: DC | PRN
  Administered 2024-05-17: 40 mg via INTRAVENOUS

## 2024-05-17 MED ORDER — LIDOCAINE HCL (CARDIAC) PF 100 MG/5ML IV SOSY
PREFILLED_SYRINGE | INTRAVENOUS | Status: DC | PRN
  Administered 2024-05-17: 80 mg via INTRAVENOUS

## 2024-05-17 MED ORDER — MIDAZOLAM HCL 2 MG/2ML IJ SOLN
INTRAMUSCULAR | Status: DC | PRN
  Administered 2024-05-17 (×2): 1 mg via INTRAVENOUS

## 2024-05-17 MED ORDER — FENTANYL CITRATE (PF) 100 MCG/2ML IJ SOLN: 25.0000 ug | INTRAMUSCULAR | Status: DC | PRN

## 2024-05-17 MED ORDER — ROCURONIUM BROMIDE 10 MG/ML (PF) SYRINGE
PREFILLED_SYRINGE | INTRAVENOUS | Status: AC
  Filled 2024-05-17: qty 10

## 2024-05-17 MED ORDER — SODIUM CHLORIDE 0.9 % IR SOLN
Status: DC | PRN
  Administered 2024-05-17: 15000 mL

## 2024-05-17 MED ORDER — SUGAMMADEX SODIUM 200 MG/2ML IV SOLN
INTRAVENOUS | Status: DC | PRN
  Administered 2024-05-17: 154.2 mg via INTRAVENOUS

## 2024-05-17 MED ORDER — DEXAMETHASONE SODIUM PHOSPHATE 10 MG/ML IJ SOLN
INTRAMUSCULAR | Status: AC
  Filled 2024-05-17: qty 1

## 2024-05-17 MED ORDER — EPHEDRINE SULFATE-NACL 50-0.9 MG/10ML-% IV SOSY
PREFILLED_SYRINGE | INTRAVENOUS | Status: DC | PRN
  Administered 2024-05-17 (×2): 5 mg via INTRAVENOUS

## 2024-05-17 MED ORDER — FENTANYL CITRATE (PF) 100 MCG/2ML IJ SOLN
INTRAMUSCULAR | Status: AC
  Filled 2024-05-17: qty 2

## 2024-05-17 MED ORDER — PHENYLEPHRINE 80 MCG/ML (10ML) SYRINGE FOR IV PUSH (FOR BLOOD PRESSURE SUPPORT)
PREFILLED_SYRINGE | INTRAVENOUS | Status: AC
  Filled 2024-05-17: qty 10

## 2024-05-17 MED ORDER — MIDAZOLAM HCL 2 MG/2ML IJ SOLN
INTRAMUSCULAR | Status: AC
  Filled 2024-05-17: qty 2

## 2024-05-17 MED ORDER — PROPOFOL 10 MG/ML IV BOLUS
INTRAVENOUS | Status: DC | PRN
  Administered 2024-05-17: 150 mg via INTRAVENOUS

## 2024-05-17 MED ORDER — ONDANSETRON HCL 4 MG/2ML IJ SOLN
INTRAMUSCULAR | Status: AC
  Filled 2024-05-17: qty 2

## 2024-05-17 MED ORDER — EPHEDRINE 5 MG/ML INJ
INTRAVENOUS | Status: AC
  Filled 2024-05-17: qty 5

## 2024-05-17 MED ORDER — PHENYLEPHRINE 80 MCG/ML (10ML) SYRINGE FOR IV PUSH (FOR BLOOD PRESSURE SUPPORT)
PREFILLED_SYRINGE | INTRAVENOUS | Status: DC | PRN
  Administered 2024-05-17: 80 ug via INTRAVENOUS

## 2024-05-17 MED ORDER — CHLORHEXIDINE GLUCONATE 0.12 % MT SOLN
OROMUCOSAL | Status: AC
Start: 2024-05-17 — End: 2024-05-17
  Filled 2024-05-17: qty 15

## 2024-05-17 MED ORDER — CEFAZOLIN SODIUM-DEXTROSE 2-4 GM/100ML-% IV SOLN
INTRAVENOUS | Status: AC
  Filled 2024-05-17: qty 100

## 2024-05-17 MED ORDER — PROPOFOL 10 MG/ML IV BOLUS
INTRAVENOUS | Status: AC
  Filled 2024-05-17: qty 20

## 2024-05-17 SURGICAL SUPPLY — 24 items
ADAPTER IRRIG TUBE 2 SPIKE SOL (ADAPTER) ×2 IMPLANT
BAG URO DRAIN 4000ML (MISCELLANEOUS) ×1 IMPLANT
CATH URETL OPEN END 4X70 (CATHETERS) ×1 IMPLANT
CATH URTH STD 24FR FL 3W 2 (CATHETERS) ×1 IMPLANT
CONTAINER COLLECT MORCELLATR (MISCELLANEOUS) ×1 IMPLANT
DRAPE UTILITY 15X26 TOWEL STRL (DRAPES) IMPLANT
FIBER LASER MOSES 550 DFL (Laser) ×1 IMPLANT
FILTER OVERFLOW MORCELLATOR (FILTER) ×1 IMPLANT
GLOVE BIOGEL PI IND STRL 7.5 (GLOVE) ×1 IMPLANT
GOWN STRL REUS W/ TWL LRG LVL3 (GOWN DISPOSABLE) ×1 IMPLANT
GOWN STRL REUS W/ TWL XL LVL3 (GOWN DISPOSABLE) ×1 IMPLANT
HOLDER FOLEY CATH W/STRAP (MISCELLANEOUS) ×1 IMPLANT
KIT TURNOVER CYSTO (KITS) ×1 IMPLANT
MEMBRANE SLNG YLW 17 FOR INST (MISCELLANEOUS) ×1 IMPLANT
MORCELLATOR ROTATION 4.75 335 (MISCELLANEOUS) ×1 IMPLANT
PACK CYSTO AR (MISCELLANEOUS) ×1 IMPLANT
SET CYSTO W/LG BORE CLAMP LF (SET/KITS/TRAYS/PACK) ×1 IMPLANT
SET IRRIG Y TYPE TUR BLADDER L (SET/KITS/TRAYS/PACK) ×1 IMPLANT
SLEEVE PROTECTION STRL DISP (MISCELLANEOUS) ×2 IMPLANT
SOL .9 NS 3000ML IRR UROMATIC (IV SOLUTION) ×5 IMPLANT
SURGILUBE 2OZ TUBE FLIPTOP (MISCELLANEOUS) ×1 IMPLANT
SYRINGE TOOMEY IRRIG 70ML (MISCELLANEOUS) ×1 IMPLANT
TUBE PUMP MORCELLATOR PIRANHA (TUBING) ×1 IMPLANT
WATER STERILE IRR 1000ML POUR (IV SOLUTION) ×1 IMPLANT

## 2024-05-17 NOTE — Transfer of Care (Signed)
 Immediate Anesthesia Transfer of Care Note  Patient: Eric Cortez  Procedure(s) Performed: ENUCLEATION, PROSTATE, USING LASER, WITH MORCELLATION  Patient Location: PACU  Anesthesia Type:General  Level of Consciousness: drowsy and patient cooperative  Airway & Oxygen Therapy: Patient Spontanous Breathing and Patient connected to face mask oxygen  Post-op Assessment: Report given to RN and Post -op Vital signs reviewed and stable  Post vital signs: stable  Last Vitals:  Vitals Value Taken Time  BP 121/71 05/17/24 10:06  Temp    Pulse 64 05/17/24 10:12  Resp 14 05/17/24 10:12  SpO2 99 % 05/17/24 10:12  Vitals shown include unfiled device data.  Last Pain:  Vitals:   05/17/24 0818  TempSrc: Temporal  PainSc: 0-No pain         Complications: No notable events documented.

## 2024-05-17 NOTE — Anesthesia Postprocedure Evaluation (Signed)
 Anesthesia Post Note  Patient: Eric Cortez  Procedure(s) Performed: ENUCLEATION, PROSTATE, USING LASER, WITH MORCELLATION  Patient location during evaluation: PACU Anesthesia Type: General Level of consciousness: awake and alert Pain management: pain level controlled Vital Signs Assessment: post-procedure vital signs reviewed and stable Respiratory status: spontaneous breathing, nonlabored ventilation, respiratory function stable and patient connected to nasal cannula oxygen Cardiovascular status: blood pressure returned to baseline and stable Postop Assessment: no apparent nausea or vomiting Anesthetic complications: no   No notable events documented.   Last Vitals:  Vitals:   05/17/24 1100 05/17/24 1115  BP: 116/80 118/73  Pulse: (!) 58 (!) 54  Resp: 13 11  Temp:    SpO2: 98% 96%    Last Pain:  Vitals:   05/17/24 1006  TempSrc:   PainSc: Asleep                 Lendia LITTIE Mae

## 2024-05-17 NOTE — H&P (Signed)
 05/17/24 8:51 AM   Eric Cortez 17-Sep-1950 [id number]  CC: BPH with obstruction  HPI: 73 year old male referred from Dr. Matilda for consideration of HOLEP.  History of mildly elevated PSA with negative biopsy, obstructive urinary symptoms despite alpha blockers, prostate measured 75 g and he opted for HOLEP.   PMH: Past Medical History:  Diagnosis Date   Acquired scoliosis    Acute ST elevation myocardial infarction (STEMI) of inferior wall (HCC) 02/25/2014   a.) troponin peaked at > 20.0 ng/mL; b.) LHC/PCI 02/25/2014: 100% pRCA (3.5 x 23 mm Xience Alpine DES)   Age-related nuclear cataract, bilateral    Allergic reaction to alpha-gal 12/31/2021   Allergy to meat 09/12/2015   Aortic atherosclerosis (HCC)    Arthritis, degenerative 07/06/2002   Asthma    Atypical mole 2014   moderate left chest   BPH with urinary obstruction    CAD (coronary artery disease)    a. 01/2014 Inf STEMI/PCI: LM nl, LAD 50p/m, LCX 30-94m, OM1 50, OM2/3/4 nl, RCA (anomalous) 100p (3.5x23 Xience Alpine DES), EF 60;    Cerebral microvascular disease    Chest pain 10/19/2014   Chronic idiopathic urticaria 12/31/2021   Dry eye syndrome of bilateral lacrimal glands    Elevated PSA    Family history of malignant neoplasm of gastrointestinal tract 07/06/2002   Foot-drop    HLD (hyperlipidemia)    Hypertension    Ischemic cardiomyopathy    a.) 01/2014 Echo: EF 45-50%, inferoseptal/inf sev HK, Gr 1 DD, nl valves.   Long-term use of aspirin  therapy    Lumbago 07/06/2002   Memory loss    a.) on NMDA receptor antagonist (memantine )   Pain in finger of left hand 08/29/2016   Perennial allergic rhinitis 12/31/2021   Sensorineural hearing loss, bilateral    Spinal stenosis of lumbar region    Spondylolisthesis, lumbar region 06/25/2021   a.) s/p L2-L5 PLIF 06/25/2021   Tremor     Surgical History: Past Surgical History:  Procedure Laterality Date   CORONARY STENT PLACEMENT  02/25/2014   LEFT  HEART CATHETERIZATION WITH CORONARY ANGIOGRAM N/A 02/25/2014   Procedure: LEFT HEART CATHETERIZATION WITH CORONARY ANGIOGRAM;  Surgeon: Victory LELON Claudene DOUGLAS, MD;  [facility];  Service: Cardiovascular;  Laterality: N/A;   PERCUTANEOUS CORONARY STENT INTERVENTION (PCI-S)  02/25/2014   Procedure: PERCUTANEOUS CORONARY STENT INTERVENTION (PCI-S);  Surgeon: Victory LELON Claudene DOUGLAS, MD;  Location: [facility] CATH LAB;  Service: Cardiovascular;;   TONSILLECTOMY       Family History: Family History  Problem Relation Age of Onset   Colon cancer Mother    Memory loss Mother    Heart attack Father    Hypertension Father    Heart disease Father    Diabetes Father    Diverticulosis Sister    Stroke Paternal Grandmother    Stroke Paternal Grandfather    Heart disease Maternal Aunt    Schizophrenia Maternal Aunt     Social History:  reports that he has never smoked. He has never used smokeless tobacco. He reports current alcohol use of about 3.0 standard drinks of alcohol per week. He reports that he does not use drugs.  Physical Exam: BP (!) 143/86   Pulse 71   Temp (!) 97.2 F (36.2 C) (Temporal)   Resp 18   SpO2 97%    Constitutional:  Alert and oriented, No acute distress. Cardiovascular: Regular rate and rhythm Respiratory: Clear to auscultation bilaterally GI: Abdomen is soft, nontender, nondistended, no  abdominal masses   Laboratory Data: Culture 8/27 no growth  Assessment & Plan:   73 year old male with BPH and obstructive urinary symptoms refractory to medical management, recent negative prostate biopsy who opted for HOLEP.  We discussed the risks and benefits of HoLEP at length.  The procedure requires general anesthesia and takes 1 to 2 hours, and a holmium laser is used to enucleate the prostate and push this tissue into the bladder.  A morcellator is then used to remove this tissue, which is sent for pathology.  The vast majority(>95%) of patients are able to discharge the same  day with a catheter in place for 2 to 3 days, and will follow-up in clinic for a voiding trial.  We specifically discussed the risks of bleeding, infection, retrograde ejaculation, temporary urgency and urge incontinence, very low risk of long-term incontinence, urethral stricture/bladder neck contracture, pathologic evaluation of prostate tissue and possible detection of prostate cancer or other malignancy, and possible need for additional procedures.  HOLEP today  Redell Burnet, MD 05/17/2024  [facility] [address], [address] [place], [place] [address] [phone]

## 2024-05-17 NOTE — Anesthesia Procedure Notes (Signed)
 Procedure Name: Intubation Date/Time: 05/17/2024 9:12 AM  Performed by: Elly Pfeiffer, CRNAPre-anesthesia Checklist: Patient identified, Emergency Drugs available, Suction available and Patient being monitored Patient Re-evaluated:Patient Re-evaluated prior to induction Oxygen Delivery Method: Circle system utilized Preoxygenation: Pre-oxygenation with 100% oxygen Induction Type: IV induction Ventilation: Mask ventilation without difficulty Laryngoscope Size: McGrath and 4 Grade View: Grade I Tube type: Oral Tube size: 7.5 mm Number of attempts: 1 Airway Equipment and Method: Stylet and Oral airway Placement Confirmation: ETT inserted through vocal cords under direct vision, positive ETCO2 and breath sounds checked- equal and bilateral Secured at: 20 cm Tube secured with: Tape Dental Injury: Teeth and Oropharynx as per pre-operative assessment  Comments: Cords clear; no trauma. CA

## 2024-05-17 NOTE — Op Note (Signed)
 Date of procedure: 05/17/24  Preoperative diagnosis:  BPH with obstruction  Postoperative diagnosis:  Same  Procedure: HoLEP (Holmium Laser Enucleation of the Prostate)  Surgeon: Redell Burnet, MD  Anesthesia: General  Complications: None  Intraoperative findings:  Moderate size prostate with obstructing lateral lobes, no bladder lesions, mild to moderate trabeculations Uncomplicated HOLEP, ureteral orifices and verumontanum intact at conclusion of case, excellent hemostasis  EBL: Minimal  Specimens: Prostate chips  Enucleation time: 16 minutes  Morcellation time: 13 minutes  Intra-op weight: 30g  Drains: 24 French three-way, 60 cc in balloon  Indication: Eric Cortez is a 73 y.o. patient with BPH and obstructive urinary symptoms despite medical management who opted for HOLEP.  After reviewing the management options for treatment, they elected to proceed with the above surgical procedure(s). We have discussed the potential benefits and risks of the procedure, side effects of the proposed treatment, the likelihood of the patient achieving the goals of the procedure, and any potential problems that might occur during the procedure or recuperation.  We specifically discussed the risks of bleeding, infection, hematuria and clot retention, need for additional procedures, possible overnight hospital stay, temporary urgency and incontinence, rare long-term incontinence, and retrograde ejaculation.  Informed consent has been obtained.   Description of procedure:  The patient was taken to the operating room and general anesthesia was induced.  The patient was placed in the dorsal lithotomy position, prepped and draped in the usual sterile fashion, and preoperative antibiotics were administered.  SCDs were placed for DVT prophylaxis.  A preoperative time-out was performed.   Fleeta Needs sounds were used to gently dilated the urethra up to 44F. The 7 French continuous flow resectoscope  was inserted into the urethra using the visual obturator  The prostate was moderate in size with obstructing lateral lobes. The bladder was thoroughly inspected and notable for mild to moderate trabeculations but no suspicious lesions.  The ureteral orifices were located in orthotopic position.    The laser was set to 2 J and 60 Hz and early apical release was performed by making a circumferential mucosal incision proximal to the sphincter.  A lambda incision was then made proximal to the verumontanum.  The prostate was enucleated en bloc circumferentially into the bladder.  The capsule was examined and laser was used for meticulous hemostasis.    The 87 French resectoscope was then switched out for the 26 French nephroscope and prostate tissue was morcellated(Piranha) and the tissue sent to pathology.  A 24 French three-way catheter was inserted easily with the aid of a catheter guide, and 60 cc were placed in the balloon.  Urine was [place] pink.  The catheter irrigated easily with a Toomey syringe.  CBI was initiated.   The patient tolerated the procedure well without any immediate complications and was extubated and transferred to the recovery room in stable condition.  Urine was clear on fast CBI.  Disposition: Stable to PACU  Plan: Wean CBI in PACU, anticipate discharge home today with Foley removal in clinic tomorrow( lives in [name] )  Redell Burnet, MD 05/17/2024

## 2024-05-17 NOTE — Anesthesia Preprocedure Evaluation (Addendum)
 Anesthesia Evaluation  Patient identified by MRN, date of birth, ID band Patient awake    Reviewed: Allergy & Precautions, NPO status , Patient's Chart, lab work & pertinent test results  History of Anesthesia Complications Negative for: history of anesthetic complications  Airway Mallampati: I  TM Distance: >3 FB Neck ROM: full    Dental no notable dental hx.    Pulmonary asthma    Pulmonary exam normal        Cardiovascular hypertension, On Medications + CAD, + Past MI and + Cardiac Stents  Normal cardiovascular exam  ECHO 2024  IMPRESSIONS     1. Left ventricular ejection fraction, by estimation, is 65 to 70%. Left  ventricular ejection fraction by 3D volume is 70 %. The left ventricle has  normal function. The left ventricle has no regional wall motion  abnormalities. Left ventricular diastolic   parameters are consistent with Grade I diastolic dysfunction (impaired  relaxation). The average left ventricular global longitudinal strain is  -21.9 %. The global longitudinal strain is normal.   2. Right ventricular systolic function is normal. The right ventricular  size is normal. There is normal pulmonary artery systolic pressure. The  estimated right ventricular systolic pressure is 20.6 mmHg.   3. The mitral valve is abnormal. Trivial mitral valve regurgitation.   4. The aortic valve is tricuspid. Aortic valve regurgitation is not  visualized. Aortic valve sclerosis is present, with no evidence of aortic  valve stenosis.   5. The inferior vena cava is normal in size with greater than 50%  respiratory variability, suggesting right atrial pressure of 3 mmHg.     Neuro/Psych negative neurological ROS  negative psych ROS   GI/Hepatic negative GI ROS, Neg liver ROS,,,  Endo/Other  negative endocrine ROS    Renal/GU negative Renal ROS  negative genitourinary   Musculoskeletal   Abdominal   Peds   Hematology negative hematology ROS (+)   Anesthesia Other Findings Past Medical History: No date: Acquired scoliosis 02/25/2014: Acute ST elevation myocardial infarction (STEMI) of  inferior wall (HCC)     Comment:  a.) troponin peaked at > 20.0 ng/mL; b.) LHC/PCI               02/25/2014: 100% pRCA (3.5 x 23 mm Xience Alpine DES) No date: Age-related nuclear cataract, bilateral 12/31/2021: Allergic reaction to alpha-gal 09/12/2015: Allergy to meat No date: Aortic atherosclerosis (HCC) 07/06/2002: Arthritis, degenerative No date: Asthma 2014: Atypical mole     Comment:  moderate left chest No date: BPH with urinary obstruction No date: CAD (coronary artery disease)     Comment:  a. 01/2014 Inf STEMI/PCI: LM nl, LAD 50p/m, LCX 30-75m,               OM1 50, OM2/3/4 nl, RCA (anomalous) 100p (3.5x23 Xience               Alpine DES), EF 60;  No date: Cerebral microvascular disease 10/19/2014: Chest pain 12/31/2021: Chronic idiopathic urticaria No date: Dry eye syndrome of bilateral lacrimal glands No date: Elevated PSA 07/06/2002: Family history of malignant neoplasm of gastrointestinal  tract No date: Foot-drop No date: HLD (hyperlipidemia) No date: Hypertension No date: Ischemic cardiomyopathy     Comment:  a.) 01/2014 Echo: EF 45-50%, inferoseptal/inf sev HK, Gr               1 DD, nl valves. No date: Long-term use of aspirin  therapy 07/06/2002: Lumbago No date: Memory loss  Comment:  a.) on NMDA receptor antagonist (memantine ) 08/29/2016: Pain in finger of left hand 12/31/2021: Perennial allergic rhinitis No date: Sensorineural hearing loss, bilateral No date: Spinal stenosis of lumbar region 06/25/2021: Spondylolisthesis, lumbar region     Comment:  a.) s/p L2-L5 PLIF 06/25/2021 No date: Tremor  Past Surgical History: 02/25/2014: CORONARY STENT PLACEMENT 02/25/2014: LEFT HEART CATHETERIZATION WITH CORONARY ANGIOGRAM; N/A     Comment:  Procedure: LEFT HEART  CATHETERIZATION WITH CORONARY               ANGIOGRAM;  Surgeon: Victory LELON Claudene DOUGLAS, MD;  Location: [facility]              CATH LAB;  Service: Cardiovascular;  Laterality: N/A; 02/25/2014: PERCUTANEOUS CORONARY STENT INTERVENTION (PCI-S)     Comment:  Procedure: PERCUTANEOUS CORONARY STENT INTERVENTION               (PCI-S);  Surgeon: Victory LELON Claudene DOUGLAS, MD;  Location: [facility]               CATH LAB;  Service: Cardiovascular;; No date: TONSILLECTOMY     Reproductive/Obstetrics negative OB ROS                              Anesthesia Physical Anesthesia Plan  ASA: 3  Anesthesia Plan: General ETT   Post-op Pain Management: Ofirmev  IV (intra-op)*   Induction: Intravenous  PONV Risk Score and Plan: 2 and Ondansetron , Dexamethasone  and Treatment may vary due to age or medical condition  Airway Management Planned: Oral ETT  Additional Equipment:   Intra-op Plan:   Post-operative Plan: Extubation in OR  Informed Consent: I have reviewed the patients History and Physical, chart, labs and discussed the procedure including the risks, benefits and alternatives for the proposed anesthesia with the patient or authorized representative who has indicated his/her understanding and acceptance.     Dental Advisory Given  Plan Discussed with: Anesthesiologist, CRNA and Surgeon  Anesthesia Plan Comments: (Patient consented for risks of anesthesia including but not limited to:  - adverse reactions to medications - damage to eyes, teeth, lips or other oral mucosa - nerve damage due to positioning  - sore throat or hoarseness - Damage to heart, brain, nerves, lungs, other parts of body or loss of life  Patient voiced understanding and assent.)         Anesthesia Quick Evaluation

## 2024-05-18 ENCOUNTER — Ambulatory Visit (INDEPENDENT_AMBULATORY_CARE_PROVIDER_SITE_OTHER): Admitting: Physician Assistant

## 2024-05-18 ENCOUNTER — Encounter: Payer: Self-pay | Admitting: Urology

## 2024-05-18 VITALS — BP 138/80 | HR 58

## 2024-05-18 DIAGNOSIS — N401 Enlarged prostate with lower urinary tract symptoms: Secondary | ICD-10-CM

## 2024-05-18 DIAGNOSIS — N138 Other obstructive and reflux uropathy: Secondary | ICD-10-CM

## 2024-05-18 LAB — SURGICAL PATHOLOGY

## 2024-05-18 NOTE — Patient Instructions (Signed)

## 2024-05-18 NOTE — Progress Notes (Signed)
 Catheter Removal  Patient is present today for a catheter removal.  58ml of water was drained from the balloon. A 24FR three-way foley cath was removed from the bladder, no complications were noted. Patient tolerated well.  Performed by: Aniayah Alaniz, PA-C   Additional notes: Counseled patient on normal postoperative findings including dysuria, gross hematuria, and urinary urgency/leakage. Counseled patient to begin Kegel exercises 3x10 sets daily to increase urinary control and wear absorbent products as needed for security. Written and verbal resources provided today. Surgical pathology pending; will defer to Dr. Francisca to share results when available.  Follow up/: Return in about 4 months (around 09/17/2024) for Postop f/u with Dr. Francisca.

## 2024-05-19 ENCOUNTER — Ambulatory Visit: Admitting: Physician Assistant

## 2024-05-19 ENCOUNTER — Ambulatory Visit: Admitting: Urology

## 2024-05-19 ENCOUNTER — Ambulatory Visit: Payer: Self-pay | Admitting: Urology

## 2024-05-21 ENCOUNTER — Ambulatory Visit: Admitting: Urology

## 2024-05-29 ENCOUNTER — Other Ambulatory Visit: Payer: Self-pay | Admitting: Internal Medicine

## 2024-06-09 ENCOUNTER — Ambulatory Visit: Admitting: Physician Assistant

## 2024-06-18 ENCOUNTER — Ambulatory Visit (INDEPENDENT_AMBULATORY_CARE_PROVIDER_SITE_OTHER): Admitting: Physician Assistant

## 2024-06-18 VITALS — BP 138/81 | HR 54 | Wt 170.0 lb

## 2024-06-18 DIAGNOSIS — R4189 Other symptoms and signs involving cognitive functions and awareness: Secondary | ICD-10-CM

## 2024-06-18 DIAGNOSIS — R253 Fasciculation: Secondary | ICD-10-CM

## 2024-06-18 MED ORDER — MEMANTINE HCL 5 MG PO TABS: 5.0000 mg | ORAL_TABLET | Freq: Two times a day (BID) | ORAL | 3 refills | Status: AC

## 2024-06-18 NOTE — Patient Instructions (Addendum)
 It was a pleasure to see you today at our office.   Recommendations:   Continue Memantine  5 mg twice a day  Continue B12  EEG Follow up April 29 at 11:30    Visit the Dementia [facility]  [facility]      RECOMMENDATIONS FOR ALL PATIENTS WITH MEMORY PROBLEMS: 1. Continue to exercise (Recommend 30 minutes of walking everyday, or 3 hours every week) 2. Increase social interactions - continue going to [place] and enjoy social gatherings with friends and family 3. Eat healthy, avoid fried foods and eat more fruits and vegetables 4. Maintain adequate blood pressure, blood sugar, and blood cholesterol level. Reducing the risk of stroke and cardiovascular disease also helps promoting better memory. 5. Avoid stressful situations. Live a simple life and avoid aggravations. Organize your time and prepare for the next day in anticipation. 6. Sleep well, avoid any interruptions of sleep and avoid any distractions in the bedroom that may interfere with adequate sleep quality 7. Avoid sugar, avoid sweets as there is a strong link between excessive sugar intake, diabetes, and cognitive impairment We discussed the Mediterranean diet, which has been shown to help patients reduce the risk of progressive memory disorders and reduces cardiovascular risk. This includes eating fish, eat fruits and green leafy vegetables, nuts like almonds and hazelnuts, walnuts, and also use olive oil. Avoid fast foods and fried foods as much as possible. Avoid sweets and sugar as sugar use has been linked to worsening of memory function.  There is always a concern of gradual progression of memory problems. If this is the case, then we may need to adjust level of care according to patient needs. Support, both to the patient and caregiver, should then be put into place.    FALL PRECAUTIONS: Be cautious when walking. Scan the area for obstacles that may increase the risk of trips and falls. When  getting up in the mornings, sit up at the edge of the bed for a few minutes before getting out of bed. Consider elevating the bed at the head end to avoid drop of blood pressure when getting up. Walk always in a well-lit room (use night lights in the walls). Avoid area rugs or power cords from appliances in the middle of the walkways. Use a walker or a cane if necessary and consider physical therapy for balance exercise. Get your eyesight checked regularly.  FINANCIAL OVERSIGHT: Supervision, especially oversight when making financial decisions or transactions is also recommended.  HOME SAFETY: Consider the safety of the kitchen when operating appliances like stoves, microwave oven, and blender. Consider having supervision and share cooking responsibilities until no longer able to participate in those. Accidents with firearms and other hazards in the house should be identified and addressed as well.   ABILITY TO BE LEFT ALONE: If patient is unable to contact 911 operator, consider using LifeLine, or when the need is there, arrange for someone to stay with patients. Smoking is a fire hazard, consider supervision or cessation. Risk of wandering should be assessed by caregiver and if detected at any point, supervision and safe proof recommendations should be instituted.  MEDICATION SUPERVISION: Inability to self-administer medication needs to be constantly addressed. Implement a mechanism to ensure safe administration of the medications.   DRIVING: Regarding driving, in patients with progressive memory problems, driving will be impaired. We advise to have someone else do the driving if trouble finding directions or if minor accidents are reported. Independent driving assessment is available to  determine safety of driving.   If you are interested in the driving assessment, you can contact the following:  [employer] in [place] [phone]  [facility]  [phone]  [facility] [phone]  [facility] [phone] or [phone]

## 2024-06-18 NOTE — Progress Notes (Signed)
 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 RH male with a history of asthma, anxiety, hearing loss seen today in follow up for subjective memory loss and component of vascular disease per neuropsych evaluation. Patient is currently on memantine  5 mg nightly. MMSE today is 28/30. Patient is able to participate on ADLs and to drive without significant difficulties.  Mood is well-controlled. Wife is concerned that the patient has involuntary muscle movements, convulsing at night while asleep without any additional seizure like signs. Suspect that there might be a component of sleep disorder and psychiatric, less likely seizure activity.     Follow up in  6 months. 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     Subjective:    This patient is accompanied in the office by his wife who supplements the history.  Previous records as well as any outside records available were reviewed prior to todays visit. Patient was last seen on 12/05/2023 with MMSE 29/30   Any changes in memory since last visit?  I am more foggy than usual-he says.  He continues to have mild difficulties with short-term memory including recent conversations, new information but not worse than prior.  He likes to work around [employer], playing piano classical music, playing golf.  He likes to play sudoku and solitaire. repeats oneself?  Endorsed Disoriented when walking into a room? Denies   Leaving objects?  May misplace things, especially with   tools-wife says.Less organized, but he is trying  Wandering behavior?  Denies.   Any personality changes since last visit?  Denies.   Any worsening depression?:  He is trying to work on his anxiety,-wife says, still some-he says Hallucinations or paranoia?  Denies.   Seizures? denies  twitching andconvulsions at night, especially when tired-wife reports   Any sleep changes? He reports that he sleeps well, wife says that he moves frequently.  Denies vivid dreams, REM behavior or sleepwalking   Sleep apnea?  Unclear diagnosis, may have had a study in the past  Any hygiene concerns? Denies.  Independent of bathing and dressing?  Endorsed  Does the patient needs help with medications?  Patient is in charge   Who is in charge of the finances? Both are  in charge     Any changes in appetite?  Denies, drinks plenty water      Patient have trouble swallowing? Denies.   Does the patient cook?  Occasionally he may barbecue outside.  No kitchen accidents. Any headaches?   denies   Any vision changes?denies  Chronic back pain  denies   Ambulates with difficulty? In the morning I need to get my balance   Recent falls or head injuries? Denies.     Unilateral weakness, numbness or tingling? denies   Any tremors?  Denies  Any anosmia?  Denies   Any incontinence of urine?  Has a history of BPH with urge incontinence, follows urology   Any bowel dysfunction?   Denies      Patient lives with his wife     Does the patient drive?  Yes, he denies any issues     Initial Visit 11/29/21 The patient is seen in neurologic consultation at the request of Mavis Purchase, MD for the evaluation of memory.  The patient is accompanied by  who supplements the history. This is a 72 y.o. year old RH  male who has had memory issues for about 2 years, although he says that he is memory always has not trouble, all my life, I am hard of hearing is a test I cannot understand .  Currently, he is not using his  hearing aids.  His wife states that he does repeat himself, and forgets recent conversations.  He never was diagnosed with ADD or ADHD, although his wife does suspect that he has it.  He denies being disoriented when walking into her room or moving objects in unusual places, however he loses things constantly, and is always trying to find staff .  He ambulates without difficulty, recently, he has been under the care of his neurosurgeon, for different areas of chronic pain, and spondylolisthesis, and he denies any worsening pain at this time, he is able to walk well, without any falls.  He does not need a cane or a walker to ambulate.  He denies any broad-based gait.  At age 89, he had a head injury when falling from a tree, which left the right parietal frontal area scar.  He also has being hit by a baseball when he was younger cry on top of my head and he left the soft spot .  He denies having had meningitis or encephalitis as a child, with a difficult birth.  He drives, and his wife states that sometimes he is still taking the shortcut, he takes the loan card, he does not listen to me .  This frustrates her.  He lives with her, and his mood is overall stable, denies any depression or irritability.  His sleep varies, he has occasional vivid dreams.  His wife states that he does not do sleepwalking, or REM behavior, however he has had moments in which she thought that he was having a seizure, he had jerky movements and possible restless leg syndrome.  His doctor placed him on a muscle relaxer, which has helped him.  He denies any history of seizures.  He denies any hallucinations or paranoia, there are no hygiene concerns, he is independent of bathing and dressing.  His medications are in a pillbox, he denies missing any doses.  He is in charge of his finances.  His appetite is good, denies trouble swallowing, he does have mild increase in production of saliva.  He does not cook.  He denies any headaches,  double vision, dizziness, focal numbness or tingling, unilateral weakness, or anosmia.  He has a history of mild intention tremors for the last 7 or 8 months.  He says that at times if he is left hand is weak, he gets muscle jerks, he drops objects.  He denies micrographia, he denies any voice changes.  He also reports muscle cramps over the last 6 months, off and on and worse over the last month .  He still able to do his activities of daily living. He denies any urine incontinence, retention, constipation or diarrhea.  Denies any history of sleep apnea, alcohol or tobacco.  He denies any history of melanoma.  Family history negative for dementia or Parkinson's disease.    Neuropsychological evaluation a 3/23, Dr. Richie briefly, results suggested an isolated impairment surrounding phonemic fluency.  However, all other aspects of expressive language were appropriate. Performances were also appropriate relative to age-matched peers across all other assessed cognitive domains. 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 Subjective day-to-day dysfunction may also be influenced by uncorrected hearing loss, ongoing anxiety, and, as reported by his wife, the fact that they have been under a tremendous amount of stress lately. This could certainly increase the risk for inefficient learning of new information and the experience of attentional and memory lapses, especially with heightened stress or when trying to multi-task.    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    PREVIOUS MEDICATIONS:   CURRENT MEDICATIONS:  Outpatient Encounter Medications as of 06/18/2024  Medication Sig   albuterol  (VENTOLIN  HFA) 108 (90  Base) MCG/ACT inhaler Inhale 2 puffs into the lungs every 6 (six) hours as needed for wheezing or shortness of breath.   aspirin  EC (ASPIRIN  LOW DOSE) 81 MG tablet Take 1 tablet (81 mg total) by mouth daily. (Patient taking differently: Take 81 mg by mouth every evening.)   atorvastatin  (LIPITOR ) 20 MG tablet Take 1 tablet (20 mg total) by mouth daily. (Patient taking differently: Take 20 mg by mouth every evening.)   co-enzyme Q-10 30 MG capsule Take 30 mg by mouth 3 (three) times daily.   Cyanocobalamin (VITAMIN B-12 PO) Take 1 Dose by mouth in the morning.   EPINEPHrine  0.3 mg/0.3 mL IJ SOAJ injection Use for life-threatening allergic reactions   fluticasone  (FLONASE ) 50 MCG/ACT nasal spray 1-2 sprays each nostril 1-7 times per week (Patient taking differently: Place 1 spray into both nostrils in the morning and at bedtime.)   nitroGLYCERIN  (NITROSTAT ) 0.4 MG SL tablet Place 1 tablet (0.4 mg total) under the tongue as needed for chest pain. (Patient taking differently: Place 0.4 mg under the tongue every 5 (five) minutes x 3 doses as needed for chest pain.)   [DISCONTINUED] memantine  (NAMENDA ) 5 MG tablet Take 1 tablet (5 mg total) by mouth daily. (Patient taking differently: Take 5 mg by mouth every evening.)   memantine  (NAMENDA ) 5 MG tablet Take 1 tablet (5 mg total) by mouth 2 (two) times daily.   No facility-administered encounter medications on file as of 06/18/2024.       06/18/2024    6:00 PM 12/05/2023   12:00 PM  MMSE - Mini Mental State Exam  Orientation to time 4 5  Orientation to Place 5 5  Registration 3 3  Attention/ Calculation 4 5  Recall 3 2  Language- name 2 objects 2 2  Language- repeat 1 1  Language- follow 3 step command 3 3  Language- read & follow direction 1 1  Write a sentence 1 1  Copy design 1 1  Total score 28 29      04/16/2023    8:00 AM 04/02/2023    1:00 PM 11/29/2021    3:00 PM  Montreal Cognitive Assessment   Visuospatial/ Executive (0/5) 4 4 4    Naming (0/3) 3 3 3   Attention: Read list of digits (0/2) 1 1 2   Attention: Read list of letters (0/1) 1 1 1   Attention: Serial 7 subtraction starting at 100 (0/3) 3 3 3   Language: Repeat phrase (0/2) 2 2 1   Language : Fluency (0/1) 1 1 0  Abstraction (0/2) 2 2 1   Delayed Recall (0/5) 3 3 1   Orientation (0/6) 6 6 6   Total 26 26 22   Adjusted Score (based on education) 27 26 22     Objective:     PHYSICAL EXAMINATION:    VITALS:   Vitals:   06/18/24 1103  BP: 138/81  Pulse: (!) 54  SpO2: 97%  Weight: 170 lb (77.1 kg)    GEN:  The patient appears stated age and is in NAD. HEENT:  Normocephalic, atraumatic.   Neurological examination:  General: NAD, well-groomed, appears stated age. Orientation: The patient is alert. Oriented to person, place and date Cranial nerves: There is good facial symmetry.The speech is fluent and clear. No aphasia or dysarthria. Fund of knowledge is appropriate. Recent memory impaired, remote memory normal.  Attention and concentration are normal able to name objects and repeat phrases.  Hearing is decreased to conversational tone.   Sensation: Sensation is intact to Reh touch throughout Motor: Strength is at least antigravity x4. DTR's 2/4 in UE/LE     Movement examination: Tone: There is normal tone in the UE/LE Abnormal movements:  no tremor on today's visit .  No myoclonus.  No asterixis.   Coordination:  There is no decremation with RAM's. Normal finger to nose  Gait and Station: The patient has no  difficulty arising out of a deep-seated chair without the use of the hands. The patient's stride length is good.  Gait is cautious and narrow.    Thank you for allowing us  the opportunity to participate in the care of this nice patient. Please do not hesitate to contact us  for any questions or concerns.   Total time spent on today's visit was 38 minutes dedicated to this patient today, preparing to see patient, examining the patient, ordering  tests and/or medications and counseling the patient, documenting clinical information in the EHR or other health record, independently interpreting results and communicating results to the patient/family, discussing treatment and goals, answering patient's questions and coordinating care.  Cc:  Lari Elspeth BRAVO, MD  [name] Sevin 06/18/2024 6:12 PM

## 2024-06-23 ENCOUNTER — Ambulatory Visit

## 2024-06-23 DIAGNOSIS — R253 Fasciculation: Secondary | ICD-10-CM

## 2024-06-23 NOTE — Progress Notes (Unsigned)
 EEG complete and ready for review.

## 2024-06-25 NOTE — Procedures (Signed)
 ELECTROENCEPHALOGRAM REPORT  Date of Study: 06/23/2024  Patient's Name: Eric Cortez MRN: [id number] Date of Birth: 02-Jul-1951  Referring Provider: [name] Sevin, PA-C  Clinical History: This is a 73 year old woman with twitching and convulsions at night. EEG for classification.  CNS Active Medications: Memantine   Technical Summary: A multichannel digital 1-hour EEG recording measured by the international 10-20 system with electrodes applied with paste and impedances below 5000 ohms performed in our laboratory with EKG monitoring in an awake and asleep patient.  Hyperventilation was not performed. Photic stimulation was performed.  The digital EEG was referentially recorded, reformatted, and digitally filtered in a variety of bipolar and referential montages for optimal display.    Description: The patient is awake and asleep during the recording.  During maximal wakefulness, there is a symmetric, medium voltage 9 Hz posterior dominant rhythm that attenuates with eye opening.  The record is symmetric.  During drowsiness and sleep, there is an increase in theta slowing of the background.  Vertex waves and symmetric sleep spindles were seen. Photic stimulation did not elicit any abnormalities.  There were no epileptiform discharges or electrographic seizures seen.    EKG lead was unremarkable.  Impression: This 1-hour awake and asleep EEG is normal.    Clinical Correlation: A normal EEG does not exclude a clinical diagnosis of epilepsy.  If further clinical questions remain, prolonged EEG may be helpful.  Clinical correlation is advised.   Darice Shivers, M.D.

## 2024-07-15 ENCOUNTER — Telehealth: Payer: Self-pay | Admitting: Physician Assistant

## 2024-07-15 NOTE — Telephone Encounter (Signed)
 Pt's wife wants a return call back to discuss the EEG that was done 3 weeks ago on her husband (Pt) PT's wife stated she has called several times with no responses.Thanks

## 2024-07-16 NOTE — Telephone Encounter (Signed)
 I advised patient of EEG results, voiced understanding and thanked me for calling.

## 2024-09-14 ENCOUNTER — Ambulatory Visit: Payer: Medicare PPO | Admitting: Allergy and Immunology

## 2024-09-15 ENCOUNTER — Ambulatory Visit: Admitting: Urology

## 2024-09-15 VITALS — BP 147/84 | HR 63 | Ht 68.0 in | Wt 167.0 lb

## 2024-09-15 DIAGNOSIS — N401 Enlarged prostate with lower urinary tract symptoms: Secondary | ICD-10-CM | POA: Diagnosis not present

## 2024-09-15 DIAGNOSIS — N138 Other obstructive and reflux uropathy: Secondary | ICD-10-CM

## 2024-09-15 LAB — BLADDER SCAN AMB NON-IMAGING

## 2024-09-15 NOTE — Progress Notes (Signed)
" ° °  09/15/2024 12:40 PM   Eric Cortez 07-05-1951 [id number]  Reason for visit: Follow up BPH status post HOLEP  History: Referred from Dr. Matilda for HOLEP, BPH with long history of obstructive symptoms, history of negative prostate biopsy Lives in [name]  and was 2 hours away, follows in [place] with Dr. Matilda Underwent uncomplicated HOLEP September 2025 with removal of 51 g benign tissue  Physical Exam: BP (!) 147/84 (BP Location: Left Arm, Patient Position: Sitting, Cuff Size: Normal)   Pulse 63   Ht 5' 8 (1.727 m)   Wt 167 lb (75.8 kg)   SpO2 98%   BMI 25.39 kg/m   Imaging/labs: HOLEP pathology benign  Today: Doing extremely well post HOLEP, no urinary complaints, urinating with a strong stream, PVR normal today at 14ml(last void approximately 1 hour ago)  Plan:   BPH: Resolved status post HOLEP, can continue routine follow-up with Dr. Matilda locally in [place] JAYSON Francisca, MD  [facility] [address], [address] [place], Eric Cort[place] [address] [phone]  "

## 2024-09-16 ENCOUNTER — Ambulatory Visit: Admitting: Urology

## 2024-09-23 ENCOUNTER — Telehealth: Payer: Self-pay | Admitting: Urology

## 2024-09-23 NOTE — Telephone Encounter (Signed)
 Wife stated patient saw Dr Francisca and was told he needs a follow up with Dr D. Please call [phone]

## 2024-09-24 NOTE — Telephone Encounter (Signed)
 Called patient wife to let her know we have scheduled patient with Dr. Sherrilee for 08/10 @10 :10a patient voiced her understanding with the advisement if patient has an issue between now and then to give us  a call patient wife voiced her understanding

## 2024-09-28 ENCOUNTER — Ambulatory Visit: Admitting: Allergy and Immunology

## 2024-09-28 NOTE — Progress Notes (Unsigned)
 " Cardiology Office Note:    Date:  09/28/2024   ID:  Eric Cortez, DOB November 30, 1950, MRN [id number]  PCP:  Lari Elspeth BRAVO, MD   [facility] Providers Cardiologist:  Stanly DELENA Leavens, MD { Click to update primary MD,subspecialty MD or APP then REFRESH:1}    Referring MD: Lari Elspeth BRAVO, MD   Chief complaint: Annual follow-up     History of Present Illness:   Eric Cortez is a 74 y.o. male with a hx of CAD s/p inferior STEMI treated with DES to RCA, HTN, HLD, ICM with EF 45-50% in 2015, memory loss presenting today for annual follow-up of chronic cardiovascular conditions.  Previously a patient of Dr. Claudene from 2015-2023, transferred to Dr. Leavens following Dr. Theressa retirement.  Most recently seen by Dr. Leavens 06/2023, was doing well at that time, was following with neurology for his memory loss.  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  HLD with LDL previously above target goal, atorvastatin  increased to 40 mg p.o. daily at prior visit.  CAD managed with aspirin , atorvastatin , as needed nitroglycerin .  Most recent echo 06/2023: LVEF 65-70%, no RWMA, G1 DD, RVSP 20.6 mmHg, trivial MV regurgitation, no significant valvular disease.  CAD  HLD  HFrEF  HTN   ROS:   Please see the history of present illness.    *** All other systems reviewed and are negative.     Past Medical History:  Diagnosis Date   Acquired scoliosis    Acute ST elevation myocardial infarction (STEMI) of inferior wall (HCC) 02/25/2014   a.) troponin peaked at > 20.0 ng/mL; b.) LHC/PCI 02/25/2014: 100% pRCA (3.5 x 23 mm Xience Alpine DES)   Age-related nuclear cataract, bilateral    Allergic reaction to alpha-gal 12/31/2021   Allergy to meat 09/12/2015   Aortic atherosclerosis    Arthritis, degenerative 07/06/2002   Asthma    Atypical mole 2014   moderate left chest   BPH with urinary obstruction    CAD  (coronary artery disease)    a. 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   Long-term use of aspirin  therapy    Lumbago 07/06/2002   Memory loss    a.) on NMDA receptor antagonist (memantine )   Pain in finger of left hand 08/29/2016   Perennial allergic rhinitis 12/31/2021   Sensorineural hearing loss, bilateral    Spinal stenosis of lumbar region    Spondylolisthesis, lumbar region 06/25/2021   a.) s/p L2-L5 PLIF 06/25/2021   Tremor     Past Surgical History:  Procedure Laterality Date   CORONARY STENT PLACEMENT  02/25/2014   HOLEP-LASER ENUCLEATION OF THE PROSTATE WITH MORCELLATION N/A 05/17/2024   Procedure: ENUCLEATION, PROSTATE, USING LASER, WITH MORCELLATION;  Surgeon: Francisca Redell BROCKS, MD;  Location: [facility] ORS;  Service: Urology;  Laterality: N/A;   LEFT HEART CATHETERIZATION WITH CORONARY ANGIOGRAM N/A 02/25/2014   Procedure: LEFT HEART CATHETERIZATION  WITH CORONARY ANGIOGRAM;  Surgeon: Victory LELON Claudene DOUGLAS, MD;  Location: [facility] CATH LAB;  Service: Cardiovascular;  Laterality: N/A;   PERCUTANEOUS CORONARY STENT INTERVENTION (PCI-S)  02/25/2014   Procedure: PERCUTANEOUS CORONARY STENT INTERVENTION (PCI-S);  Surgeon: Victory LELON Claudene DOUGLAS, MD;  Location: [facility] C[facility] LAB;  Service: Cardiovascular;;   TONSILLECTOMY      Current Medications: Active Medications[1]   Allergies:   Alpha-gal   Social History   Socioeconomic History   Marital status: Married    Spouse name: Rock   Number of children: Not on file    Years of education: 12   Highest education level: High school graduate  Occupational History   Occupation: Retired    Comment: curator  Tobacco Use   Smoking status: Never   Smokeless tobacco: Never  Vaping Use   Vaping status: Never Used  Substance and Sexual Activity   Alcohol use: Yes    Alcohol/week: 3.0 standard drinks of alcohol    Types: 3 Glasses of wine per week    Comment: occassional ETOH   Drug use: No   Sexual activity: Not Currently  Other Topics Concern   Not on file  Social History Narrative   Right Handed    Drinks a few cups of coffee in the morning    Lives with wife   From [place]   One story    Social Drivers of Health   Tobacco Use: Low Risk (05/17/2024)   Patient History    Smoking Tobacco Use: Never    Smokeless Tobacco Use: Never    Passive Exposure: Not on file  Financial Resource Strain: Not on file  Food Insecurity: Not on file  Transportation Needs: Not on file  Physical Activity: Not on file  Stress: Not on file  Social Connections: Not on file  Depression (EYV7-0): Not on file  Alcohol Screen: Not on file  Housing: Not on file  Utilities: Not on file  Health Literacy: Not on file     Family History: The patient's ***family history includes Colon cancer in his mother; Diabetes in his father; Diverticulosis in his sister; Heart attack in his father; Heart disease in his father and maternal aunt; Hypertension in his father; Memory loss in his mother; Schizophrenia in his maternal aunt; Stroke in his paternal grandfather and paternal grandmother.  EKGs/Labs/Other Studies Reviewed:    The following studies were reviewed today: ***      Recent Labs: 09/30/2023: ALT 18 05/17/2024: BUN 14; Creatinine, Ser 1.24; Hemoglobin 16.9; Platelets 211; Potassium 3.8; Sodium 143  Recent Lipid Panel    Component Value Date/Time   CHOL 139 09/30/2023 0922   TRIG 72 09/30/2023 0922   HDL 41 09/30/2023 0922   CHOLHDL 3.4 09/30/2023 0922    CHOLHDL 4 09/05/2014 1029   VLDL 16.0 09/05/2014 1029   LDLCALC 84 09/30/2023 0922     Risk Assessment/Calculations:   {Does this patient have ATRIAL FIBRILLATION?:[phone]}  No BP recorded.  {Refresh Note OR Click here to enter BP  :1}***         Physical Exam:    VS:  There were no vitals taken for this visit.       Wt Readings from Last 3 Encounters:  09/15/24 167 lb (75.8 kg)  06/18/24 170 lb (77.1 kg)  05/10/24 170 lb (77.1 kg)     GEN: *** Well nourished, well developed in no acute distress HEENT: Normal NECK: No JVD; No carotid bruits CARDIAC: *** S1-S2 normal, RRR, no  murmurs, rubs, gallops RESPIRATORY:  Clear to auscultation without rales, wheezing or rhonchi  MUSCULOSKELETAL:  No edema; No deformity  SKIN: Warm and dry NEUROLOGIC:  Alert and oriented x 3 PSYCHIATRIC:  Normal affect       Assessment & Plan   Disposition: *** Route to primary cardiologist      {Are you ordering a CV Procedure (e.g. stress test, cath, DCCV, TEE, etc)?   Press F2        :[id number]}    Medication Adjustments/Labs and Tests Ordered: Current medicines are reviewed at length with the patient today.  Concerns regarding medicines are outlined above.  No orders of the defined types were placed in this encounter.  No orders of the defined types were placed in this encounter.   There are no Patient Instructions on file for this visit.   Signed, Miriam FORBES Shams, NP  09/28/2024 10:47 AM    [facility]    [1]  No outpatient medications have been marked as taking for the 09/30/24 encounter (Appointment) with Evelene Roussin E, NP.   "

## 2024-09-30 ENCOUNTER — Ambulatory Visit: Admitting: Emergency Medicine

## 2024-10-05 ENCOUNTER — Ambulatory Visit: Admitting: Allergy

## 2024-11-02 ENCOUNTER — Ambulatory Visit: Admitting: Emergency Medicine

## 2024-11-12 ENCOUNTER — Ambulatory Visit: Admitting: Allergy

## 2024-12-29 ENCOUNTER — Ambulatory Visit: Payer: Self-pay | Admitting: Physician Assistant

## 2025-04-11 ENCOUNTER — Ambulatory Visit: Admitting: Urology
